# Patient Record
Sex: Female | Born: 1980 | Hispanic: Yes | Marital: Married
Health system: Southern US, Academic
[De-identification: ages and names within clinical notes are randomized; demographics above are authoritative.]

## PROBLEM LIST (undated history)

## (undated) ENCOUNTER — Ambulatory Visit: Payer: Medicaid (Managed Care)

## (undated) ENCOUNTER — Encounter: Attending: Advanced Practice Midwife | Primary: Advanced Practice Midwife

## (undated) ENCOUNTER — Encounter

## (undated) ENCOUNTER — Encounter: Attending: Dermatology | Primary: Dermatology

## (undated) ENCOUNTER — Telehealth: Attending: Dermatology | Primary: Dermatology

## (undated) ENCOUNTER — Telehealth

## (undated) ENCOUNTER — Ambulatory Visit

## (undated) ENCOUNTER — Encounter: Payer: PRIVATE HEALTH INSURANCE | Attending: Registered" | Primary: Registered"

## (undated) ENCOUNTER — Ambulatory Visit: Payer: Medicaid (Managed Care) | Attending: Retina Specialist | Primary: Retina Specialist

## (undated) ENCOUNTER — Encounter: Attending: Medical | Primary: Medical

## (undated) ENCOUNTER — Telehealth: Attending: Advanced Practice Midwife | Primary: Advanced Practice Midwife

## (undated) ENCOUNTER — Ambulatory Visit
Attending: Student in an Organized Health Care Education/Training Program | Primary: Student in an Organized Health Care Education/Training Program

## (undated) ENCOUNTER — Encounter: Attending: Primary Care | Primary: Primary Care

## (undated) ENCOUNTER — Ambulatory Visit: Payer: PRIVATE HEALTH INSURANCE | Attending: Registered" | Primary: Registered"

## (undated) ENCOUNTER — Encounter
Attending: Student in an Organized Health Care Education/Training Program | Primary: Student in an Organized Health Care Education/Training Program

## (undated) ENCOUNTER — Ambulatory Visit: Payer: PRIVATE HEALTH INSURANCE

## (undated) ENCOUNTER — Encounter: Payer: PRIVATE HEALTH INSURANCE | Attending: Advanced Practice Midwife | Primary: Advanced Practice Midwife

## (undated) ENCOUNTER — Encounter: Attending: Registered" | Primary: Registered"

## (undated) ENCOUNTER — Ambulatory Visit: Payer: PRIVATE HEALTH INSURANCE | Attending: Dermatology | Primary: Dermatology

## (undated) ENCOUNTER — Encounter: Payer: PRIVATE HEALTH INSURANCE | Attending: Obstetrics & Gynecology | Primary: Obstetrics & Gynecology

## (undated) ENCOUNTER — Ambulatory Visit: Payer: PRIVATE HEALTH INSURANCE | Attending: "Women's Health Care | Primary: "Women's Health Care

## (undated) ENCOUNTER — Encounter: Attending: Physician Assistant | Primary: Physician Assistant

## (undated) ENCOUNTER — Ambulatory Visit: Payer: Medicaid (Managed Care) | Attending: Medical | Primary: Medical

## (undated) ENCOUNTER — Encounter: Attending: Obstetrics & Gynecology | Primary: Obstetrics & Gynecology

## (undated) ENCOUNTER — Encounter: Payer: PRIVATE HEALTH INSURANCE | Attending: Retina Specialist | Primary: Retina Specialist

## (undated) ENCOUNTER — Telehealth
Attending: Student in an Organized Health Care Education/Training Program | Primary: Student in an Organized Health Care Education/Training Program

## (undated) ENCOUNTER — Telehealth: Payer: PRIVATE HEALTH INSURANCE

## (undated) DIAGNOSIS — E039 Hypothyroidism, unspecified: Secondary | ICD-10-CM

## (undated) DIAGNOSIS — F419 Anxiety disorder, unspecified: Secondary | ICD-10-CM

## (undated) DIAGNOSIS — E785 Hyperlipidemia, unspecified: Secondary | ICD-10-CM

## (undated) DIAGNOSIS — L409 Psoriasis, unspecified: Secondary | ICD-10-CM

## (undated) DIAGNOSIS — E119 Type 2 diabetes mellitus without complications: Secondary | ICD-10-CM

## (undated) DIAGNOSIS — Z87442 Personal history of urinary calculi: Secondary | ICD-10-CM

## (undated) HISTORY — DX: Psoriasis, unspecified: L40.9

## (undated) HISTORY — PX: DILATION AND CURETTAGE OF UTERUS: SHX78

## (undated) HISTORY — PX: APPENDECTOMY: SHX54

## (undated) MED ORDER — SITAGLIPTIN 50 MG-METFORMIN 1,000 MG TABLET: Freq: Two times a day (BID) | ORAL | 0 days

## (undated) MED ORDER — METFORMIN ER 500 MG 24 HR TABLET,EXTENDED RELEASE
Freq: Every day | ORAL | 0.00000 days
Start: ? — End: 2020-09-12

## (undated) MED ORDER — EMPAGLIFLOZIN 10 MG TABLET: Freq: Every day | ORAL | 0 days

## (undated) MED ORDER — ASPIRIN 81 MG CAPSULE: Freq: Every day | ORAL | 0.00000 days

---
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
## 2004-11-06 ENCOUNTER — Emergency Department: Payer: Self-pay | Admitting: Emergency Medicine

## 2004-11-07 ENCOUNTER — Ambulatory Visit: Payer: Self-pay | Admitting: Emergency Medicine

## 2004-11-20 ENCOUNTER — Emergency Department: Payer: Self-pay | Admitting: Emergency Medicine

## 2005-08-19 ENCOUNTER — Emergency Department: Payer: Self-pay | Admitting: Emergency Medicine

## 2009-07-03 ENCOUNTER — Ambulatory Visit: Payer: Self-pay

## 2017-07-06 ENCOUNTER — Ambulatory Visit
Admission: RE | Admit: 2017-07-06 | Discharge: 2017-07-06 | Payer: Commercial Managed Care - PPO | Attending: Dermatology | Admitting: Dermatology

## 2017-07-06 DIAGNOSIS — L409 Psoriasis, unspecified: Principal | ICD-10-CM

## 2017-07-06 DIAGNOSIS — Z3490 Encounter for supervision of normal pregnancy, unspecified, unspecified trimester: Secondary | ICD-10-CM

## 2017-07-06 MED ORDER — TRIAMCINOLONE ACETONIDE 0.1 % TOPICAL OINTMENT
6 refills | 0 days | Status: CP
Start: 2017-07-06 — End: ?

## 2017-07-28 ENCOUNTER — Emergency Department
Admission: EM | Admit: 2017-07-28 | Discharge: 2017-07-29 | Disposition: A | Discharge status: Home / Self Care | Payer: Medicaid Other | Attending: Student in an Organized Health Care Education/Training Program | Admitting: Student in an Organized Health Care Education/Training Program

## 2017-07-28 ENCOUNTER — Encounter: Payer: Self-pay | Admitting: *Deleted

## 2017-07-28 ENCOUNTER — Emergency Department: Payer: Medicaid Other

## 2017-07-28 DIAGNOSIS — Z3A01 Less than 8 weeks gestation of pregnancy: Secondary | ICD-10-CM | POA: Diagnosis not present

## 2017-07-28 DIAGNOSIS — O4690 Antepartum hemorrhage, unspecified, unspecified trimester: Secondary | ICD-10-CM

## 2017-07-28 DIAGNOSIS — N939 Abnormal uterine and vaginal bleeding, unspecified: Secondary | ICD-10-CM | POA: Diagnosis present

## 2017-07-28 DIAGNOSIS — O200 Threatened abortion: Secondary | ICD-10-CM | POA: Diagnosis not present

## 2017-07-28 LAB — URINALYSIS, COMPLETE (UACMP) WITH MICROSCOPIC
Bilirubin Urine: NEGATIVE
KETONES UR: NEGATIVE mg/dL
Leukocytes, UA: NEGATIVE
Nitrite: NEGATIVE
PROTEIN: NEGATIVE mg/dL
Specific Gravity, Urine: 1.001 — ABNORMAL LOW (ref 1.005–1.030)
pH: 7 (ref 5.0–8.0)

## 2017-07-28 LAB — CBC WITH DIFFERENTIAL/PLATELET
Basophils Absolute: 0 10*3/uL (ref 0–0.1)
Basophils Relative: 0 %
EOS PCT: 1 %
Eosinophils Absolute: 0.1 10*3/uL (ref 0–0.7)
HEMATOCRIT: 39.7 % (ref 35.0–47.0)
Hemoglobin: 13.3 g/dL (ref 12.0–16.0)
LYMPHS ABS: 3.3 10*3/uL (ref 1.0–3.6)
LYMPHS PCT: 29 %
MCH: 28.1 pg (ref 26.0–34.0)
MCHC: 33.4 g/dL (ref 32.0–36.0)
MCV: 84.3 fL (ref 80.0–100.0)
MONO ABS: 0.8 10*3/uL (ref 0.2–0.9)
MONOS PCT: 7 %
Neutro Abs: 7.2 10*3/uL — ABNORMAL HIGH (ref 1.4–6.5)
Neutrophils Relative %: 63 %
PLATELETS: 315 10*3/uL (ref 150–440)
RBC: 4.71 MIL/uL (ref 3.80–5.20)
RDW: 15.8 % — AB (ref 11.5–14.5)
WBC: 11.4 10*3/uL — ABNORMAL HIGH (ref 3.6–11.0)

## 2017-07-28 LAB — BASIC METABOLIC PANEL
Anion gap: 8 (ref 5–15)
BUN: 10 mg/dL (ref 6–20)
CALCIUM: 9.3 mg/dL (ref 8.9–10.3)
CO2: 25 mmol/L (ref 22–32)
Chloride: 104 mmol/L (ref 101–111)
Creatinine, Ser: 0.83 mg/dL (ref 0.44–1.00)
GFR calc Af Amer: >60 mL/min (ref >60)
GLUCOSE: 246 mg/dL — AB (ref 65–99)
Potassium: 3.7 mmol/L (ref 3.5–5.1)
Sodium: 137 mmol/L (ref 135–145)

## 2017-07-28 LAB — ABO/RH: ABO/RH(D): O POS

## 2017-07-28 LAB — POCT PREGNANCY, URINE: Preg Test, Ur: POSITIVE — AB

## 2017-07-28 LAB — HCG, QUANTITATIVE, PREGNANCY: HCG, BETA CHAIN, QUANT, S: 49304 m[IU]/mL — AB (ref <5)

## 2017-07-28 NOTE — ED Triage Notes (Signed)
Patient ambulatory to stat desk without any difficulty. Reports she is [redacted] weeks pregnant, noticed vaginal bleeding while in the shower. When asked how much bleeding she has, she states "it was like I was peeing and very clear with blood in it". Patient denies bleeding being more than a pad per hour.

## 2017-07-28 NOTE — ED Triage Notes (Signed)
Pt is approx [redacted] weeks pregnant.  Pt reports vaginal bleeding today.  No abd pain  No urianary sx.  Pt alert.

## 2017-07-29 NOTE — ED Notes (Signed)
Patient discharge and follow up information reviewed with patient by ED nursing staff and patient given the opportunity to ask questions pertaining to ED visit and discharge plan of care. Patient advised that should symptoms not continue to improve, resolve entirely, or should new symptoms develop then a follow up visit with their PCP or a return visit to the ED may be warranted. Patient verbalized consent and understanding of discharge plan of care including potential need for further evaluation. Patient being discharged in stable condition per attending ED physician on duty.  Discussed with pt with stratus interpreter

## 2017-07-29 NOTE — ED Notes (Signed)
Pt currently not in rm at this time, pt in ultrasound and will come to room when completed.

## 2017-07-29 NOTE — ED Provider Notes (Signed)
[facility] Emergency Department Provider Note    First MD Initiated Contact with Patient 07/29/17 0001     (approximate)  I have reviewed the triage vital signs and the nursing notes.   HISTORY  Chief Complaint Vaginal Bleeding    HPI Caroline Pollard is a 36 y.o. female presents with chief complaint of one episode of vaginal bleeding and spotting that started this evening. Denies any neck pain but did notice some cramping at initial bleeding. Denies any trauma. No fevers. This is her first pregnancy. No family history of bleeding disorders. Has not noted any passage of tissue or clot. Does have a history of pre-diabetes and thyroid disease for which she takes medications.   No past medical history on file. No family history on file. No past surgical history on file. There are no active problems to display for this patient.     Prior to Admission medications   Not on File    Allergies Patient has no known allergies.    Social History Social History  Substance Use Topics  . Smoking status: Never Smoker  . Smokeless tobacco: Never Used  . Alcohol use No    Review of Systems Patient denies headaches, rhinorrhea, blurry vision, numbness, shortness of breath, chest pain, edema, cough, abdominal pain, nausea, vomiting, diarrhea, dysuria, fevers, rashes or hallucinations unless otherwise stated above in HPI. ____________________________________________   PHYSICAL EXAM:  VITAL SIGNS: Vitals:   07/28/17 2044 07/29/17 0033  BP: 123/76   Pulse: 82 82  Resp: 20   Temp: 99.3 F (37.4 C)   SpO2: 100% 100%    Constitutional: Alert and oriented. Well appearing and in no acute distress. Eyes: Conjunctivae are normal.  Head: Atraumatic. Nose: No congestion/rhinnorhea. Mouth/Throat: Mucous membranes are moist.   Neck: No stridor. Painless ROM.  Cardiovascular: Normal rate, regular rhythm. Grossly normal heart sounds.  Good  peripheral circulation. Respiratory: Normal respiratory effort.  No retractions. Lungs CTAB. Gastrointestinal: Soft and nontender. No distention. No abdominal bruits. No CVA tenderness. Genitourinary: deferred Musculoskeletal: No lower extremity tenderness nor edema.  No joint effusions. Neurologic:  Normal speech and language. No gross focal neurologic deficits are appreciated. No facial droop Skin:  Skin is warm, dry and intact. No rash noted. Psychiatric: Mood and affect are normal. Speech and behavior are normal.  ____________________________________________   LABS (all labs ordered are listed, but only abnormal results are displayed)  Results for orders placed or performed during the hospital encounter of 07/28/17 (from the past 24 hour(s))  hCG, quantitative, pregnancy     Status: Abnormal   Collection Time: 07/28/17  8:43 PM  Result Value Ref Range   hCG, Beta Chain, Quant, S 49,304 (H) <5 mIU/mL  ABO/Rh     Status: None   Collection Time: 07/28/17  8:43 PM  Result Value Ref Range   ABO/RH(D) O POS   CBC with Differential     Status: Abnormal   Collection Time: 07/28/17  8:43 PM  Result Value Ref Range   WBC 11.4 (H) 3.6 - 11.0 K/uL   RBC 4.71 3.80 - 5.20 MIL/uL   Hemoglobin 13.3 12.0 - 16.0 g/dL   HCT 39.7 35.0 - 47.0 %   MCV 84.3 80.0 - 100.0 fL   MCH 28.1 26.0 - 34.0 pg   MCHC 33.4 32.0 - 36.0 g/dL   RDW 15.8 (H) 11.5 - 14.5 %   Platelets 315 150 - 440 K/uL   Neutrophils Relative % 63 %  Neutro Abs 7.2 (H) 1.4 - 6.5 K/uL   Lymphocytes Relative 29 %   Lymphs Abs 3.3 1.0 - 3.6 K/uL   Monocytes Relative 7 %   Monocytes Absolute 0.8 0.2 - 0.9 K/uL   Eosinophils Relative 1 %   Eosinophils Absolute 0.1 0 - 0.7 K/uL   Basophils Relative 0 %   Basophils Absolute 0.0 0 - 0.1 K/uL  Basic metabolic panel     Status: Abnormal   Collection Time: 07/28/17  8:43 PM  Result Value Ref Range   Sodium 137 135 - 145 mmol/L   Potassium 3.7 3.5 - 5.1 mmol/L   Chloride 104 101 -  111 mmol/L   CO2 25 22 - 32 mmol/L   Glucose, Bld 246 (H) 65 - 99 mg/dL   BUN 10 6 - 20 mg/dL   Creatinine, Ser 0.83 0.44 - 1.00 mg/dL   Calcium 9.3 8.9 - 10.3 mg/dL   GFR calc non Af Amer >60 >60 mL/min   GFR calc Af Amer >60 >60 mL/min   Anion gap 8 5 - 15  Urinalysis, Complete w Microscopic     Status: Abnormal   Collection Time: 07/28/17  8:43 PM  Result Value Ref Range   Color, Urine STRAW (A) YELLOW   APPearance CLEAR (A) CLEAR   Specific Gravity, Urine 1.001 (L) 1.005 - 1.030   pH 7.0 5.0 - 8.0   Glucose, UA >=500 (A) NEGATIVE mg/dL   Hgb urine dipstick MODERATE (A) NEGATIVE   Bilirubin Urine NEGATIVE NEGATIVE   Ketones, ur NEGATIVE NEGATIVE mg/dL   Protein, ur NEGATIVE NEGATIVE mg/dL   Nitrite NEGATIVE NEGATIVE   Leukocytes, UA NEGATIVE NEGATIVE   RBC / HPF 0-5 0 - 5 RBC/hpf   WBC, UA 0-5 0 - 5 WBC/hpf   Bacteria, UA RARE (A) NONE SEEN   Squamous Epithelial / LPF 0-5 (A) NONE SEEN  Pregnancy, urine POC     Status: Abnormal   Collection Time: 07/28/17  8:58 PM  Result Value Ref Range   Preg Test, Ur POSITIVE (A) NEGATIVE   ____________________________________________  ____________________________________________  RADIOLOGY  I personally reviewed all radiographic images ordered to evaluate for the above acute complaints and reviewed radiology reports and findings.  These findings were personally discussed with the patient.  Please see medical record for radiology report.  ____________________________________________   PROCEDURES  Procedure(s) performed:  Procedures    Critical Care performed: no ____________________________________________   INITIAL IMPRESSION / ASSESSMENT AND PLAN / ED COURSE  Pertinent labs & imaging results that were available during my care of the patient were reviewed by me and considered in my medical decision making (see chart for details).  DDX: threatened ab, ectopic, subchorionic hemorrhage, ruptured uterus  Caroline  Shavonda Pollard is a 36 y.o. who presents to the ED with vaginal bleeding in early pregnancy. Patient is Rh+. Blood work is reassuring the patient is hemodynamic stable. Ultrasound ordered for the above differential shows evidence of reassuring intrauterine pregnancy. No evidence of ectopic. Presentation most consistent with a threatened miscarriage. Patient stable for follow-up with OB/GYN.      ____________________________________________   FINAL CLINICAL IMPRESSION(S) / ED DIAGNOSES  Final diagnoses:  Vaginal bleeding in pregnancy  Threatened miscarriage      NEW MEDICATIONS STARTED DURING THIS VISIT:  New Prescriptions   No medications on file     Note:  This document was prepared using Dragon voice recognition software and may include unintentional dictation errors.    Merlyn Lot,  MD 07/29/17 9201

## 2017-08-03 ENCOUNTER — Ambulatory Visit
Admission: RE | Admit: 2017-08-03 | Discharge: 2017-08-03 | Disposition: A | Discharge status: Home / Self Care | Payer: MEDICAID

## 2017-08-03 ENCOUNTER — Ambulatory Visit
Admission: RE | Admit: 2017-08-03 | Discharge: 2017-08-03 | Disposition: A | Discharge status: Home / Self Care | Attending: Advanced Practice Midwife | Admitting: Advanced Practice Midwife

## 2017-08-03 ENCOUNTER — Ambulatory Visit: Admission: RE | Admit: 2017-08-03 | Discharge: 2017-08-03 | Disposition: A | Discharge status: Home / Self Care

## 2017-08-03 DIAGNOSIS — K760 Fatty (change of) liver, not elsewhere classified: Secondary | ICD-10-CM

## 2017-08-03 DIAGNOSIS — Q51810 Arcuate uterus: Secondary | ICD-10-CM

## 2017-08-03 DIAGNOSIS — O24111 Pre-existing diabetes mellitus, type 2, in pregnancy, first trimester: Secondary | ICD-10-CM

## 2017-08-03 DIAGNOSIS — E782 Mixed hyperlipidemia: Secondary | ICD-10-CM

## 2017-08-03 DIAGNOSIS — E669 Obesity, unspecified: Secondary | ICD-10-CM

## 2017-08-03 DIAGNOSIS — O0991 Supervision of high risk pregnancy, unspecified, first trimester: Secondary | ICD-10-CM

## 2017-08-03 DIAGNOSIS — E118 Type 2 diabetes mellitus with unspecified complications: Secondary | ICD-10-CM

## 2017-08-03 DIAGNOSIS — L409 Psoriasis, unspecified: Principal | ICD-10-CM

## 2017-08-03 DIAGNOSIS — E039 Hypothyroidism, unspecified: Secondary | ICD-10-CM

## 2017-08-03 DIAGNOSIS — O0901 Supervision of pregnancy with history of infertility, first trimester: Secondary | ICD-10-CM

## 2017-08-03 DIAGNOSIS — O09511 Supervision of elderly primigravida, first trimester: Principal | ICD-10-CM

## 2017-08-03 DIAGNOSIS — Z3490 Encounter for supervision of normal pregnancy, unspecified, unspecified trimester: Principal | ICD-10-CM

## 2017-08-03 MED ORDER — INSULIN SYRINGE U-100 WITH NEEDLE 0.3 ML 30 GAUGE X 5/16"
3 refills | 0 days | Status: CP
Start: 2017-08-03 — End: 2018-03-15

## 2017-08-03 MED ORDER — GLUCAGON (HUMAN RECOMBINANT) 1 MG INJECTION KIT
1 refills | 0 days | Status: CP
Start: 2017-08-03 — End: 2018-08-03

## 2017-08-03 MED ORDER — INSULIN NPH ISOPHANE U-100 HUMAN 100 UNIT/ML SUBCUTANEOUS SUSPENSION
3 refills | 0 days | Status: CP
Start: 2017-08-03 — End: 2017-10-02

## 2017-08-03 MED ORDER — INSULIN U-100 REGULAR HUMAN 100 UNIT/ML INJECTION SOLUTION
0 refills | 0 days | Status: CP
Start: 2017-08-03 — End: 2017-10-02

## 2017-08-04 ENCOUNTER — Emergency Department
Admission: EM | Admit: 2017-08-04 | Discharge: 2017-08-05 | Disposition: A | Discharge status: Home / Self Care | Payer: MEDICAID | Source: Intra-hospital

## 2017-08-04 ENCOUNTER — Emergency Department
Admission: EM | Admit: 2017-08-04 | Discharge: 2017-08-05 | Disposition: A | Discharge status: Home / Self Care | Payer: MEDICAID | Source: Intra-hospital | Attending: Emergency Medicine | Admitting: Emergency Medicine

## 2017-08-05 DIAGNOSIS — O4690 Antepartum hemorrhage, unspecified, unspecified trimester: Principal | ICD-10-CM

## 2017-08-10 ENCOUNTER — Ambulatory Visit
Admission: RE | Admit: 2017-08-10 | Discharge: 2017-08-10 | Disposition: A | Discharge status: Home / Self Care | Payer: MEDICAID | Attending: Registered" | Admitting: Registered"

## 2017-08-10 ENCOUNTER — Ambulatory Visit
Admission: RE | Admit: 2017-08-10 | Discharge: 2017-08-10 | Disposition: A | Discharge status: Home / Self Care | Payer: MEDICAID | Attending: Advanced Practice Midwife | Admitting: Advanced Practice Midwife

## 2017-08-10 ENCOUNTER — Ambulatory Visit
Admission: RE | Admit: 2017-08-10 | Discharge: 2017-08-10 | Disposition: A | Discharge status: Home / Self Care | Payer: MEDICAID

## 2017-08-10 DIAGNOSIS — O09511 Supervision of elderly primigravida, first trimester: Secondary | ICD-10-CM

## 2017-08-10 DIAGNOSIS — N939 Abnormal uterine and vaginal bleeding, unspecified: Principal | ICD-10-CM

## 2017-08-10 DIAGNOSIS — O24111 Pre-existing diabetes mellitus, type 2, in pregnancy, first trimester: Principal | ICD-10-CM

## 2017-08-10 DIAGNOSIS — O200 Threatened abortion: Secondary | ICD-10-CM

## 2017-08-10 DIAGNOSIS — E669 Obesity, unspecified: Secondary | ICD-10-CM

## 2017-08-10 DIAGNOSIS — E118 Type 2 diabetes mellitus with unspecified complications: Secondary | ICD-10-CM

## 2017-08-10 DIAGNOSIS — K760 Fatty (change of) liver, not elsewhere classified: Secondary | ICD-10-CM

## 2017-08-10 DIAGNOSIS — O0991 Supervision of high risk pregnancy, unspecified, first trimester: Principal | ICD-10-CM

## 2017-08-10 DIAGNOSIS — E039 Hypothyroidism, unspecified: Secondary | ICD-10-CM

## 2017-08-10 MED ORDER — LEVOTHYROXINE 137 MCG TABLET
ORAL_TABLET | Freq: Every day | ORAL | 3 refills | 0.00000 days | Status: CP
Start: 2017-08-10 — End: 2017-11-13

## 2017-08-19 ENCOUNTER — Ambulatory Visit
Admission: RE | Admit: 2017-08-19 | Discharge: 2017-08-19 | Disposition: A | Discharge status: Home / Self Care | Payer: MEDICAID | Attending: Advanced Practice Midwife | Admitting: Advanced Practice Midwife

## 2017-08-19 DIAGNOSIS — Q51810 Arcuate uterus: Secondary | ICD-10-CM

## 2017-08-19 DIAGNOSIS — E118 Type 2 diabetes mellitus with unspecified complications: Secondary | ICD-10-CM

## 2017-08-19 DIAGNOSIS — O09511 Supervision of elderly primigravida, first trimester: Secondary | ICD-10-CM

## 2017-08-19 DIAGNOSIS — E669 Obesity, unspecified: Secondary | ICD-10-CM

## 2017-08-19 DIAGNOSIS — O0991 Supervision of high risk pregnancy, unspecified, first trimester: Principal | ICD-10-CM

## 2017-08-19 DIAGNOSIS — E039 Hypothyroidism, unspecified: Secondary | ICD-10-CM

## 2017-08-26 ENCOUNTER — Ambulatory Visit
Admission: RE | Admit: 2017-08-26 | Discharge: 2017-08-26 | Disposition: A | Discharge status: Home / Self Care | Payer: MEDICAID | Attending: Advanced Practice Midwife | Admitting: Advanced Practice Midwife

## 2017-08-26 DIAGNOSIS — E669 Obesity, unspecified: Secondary | ICD-10-CM

## 2017-08-26 DIAGNOSIS — O09511 Supervision of elderly primigravida, first trimester: Secondary | ICD-10-CM

## 2017-08-26 DIAGNOSIS — E118 Type 2 diabetes mellitus with unspecified complications: Principal | ICD-10-CM

## 2017-08-26 DIAGNOSIS — E039 Hypothyroidism, unspecified: Secondary | ICD-10-CM

## 2017-08-26 DIAGNOSIS — O0991 Supervision of high risk pregnancy, unspecified, first trimester: Secondary | ICD-10-CM

## 2017-09-02 ENCOUNTER — Ambulatory Visit
Admission: RE | Admit: 2017-09-02 | Discharge: 2017-09-02 | Disposition: A | Discharge status: Home / Self Care | Payer: MEDICAID | Attending: Advanced Practice Midwife | Admitting: Advanced Practice Midwife

## 2017-09-02 DIAGNOSIS — O09511 Supervision of elderly primigravida, first trimester: Secondary | ICD-10-CM

## 2017-09-02 DIAGNOSIS — O0991 Supervision of high risk pregnancy, unspecified, first trimester: Secondary | ICD-10-CM

## 2017-09-02 DIAGNOSIS — E669 Obesity, unspecified: Secondary | ICD-10-CM

## 2017-09-02 DIAGNOSIS — E118 Type 2 diabetes mellitus with unspecified complications: Principal | ICD-10-CM

## 2017-09-02 DIAGNOSIS — E039 Hypothyroidism, unspecified: Secondary | ICD-10-CM

## 2017-09-09 ENCOUNTER — Ambulatory Visit
Admission: RE | Admit: 2017-09-09 | Discharge: 2017-09-09 | Disposition: A | Discharge status: Home / Self Care | Payer: MEDICAID | Attending: Advanced Practice Midwife | Admitting: Advanced Practice Midwife

## 2017-09-09 DIAGNOSIS — E118 Type 2 diabetes mellitus with unspecified complications: Principal | ICD-10-CM

## 2017-09-09 DIAGNOSIS — E039 Hypothyroidism, unspecified: Secondary | ICD-10-CM

## 2017-09-09 DIAGNOSIS — O26899 Other specified pregnancy related conditions, unspecified trimester: Secondary | ICD-10-CM

## 2017-09-09 DIAGNOSIS — E669 Obesity, unspecified: Secondary | ICD-10-CM

## 2017-09-09 DIAGNOSIS — Z9189 Other specified personal risk factors, not elsewhere classified: Secondary | ICD-10-CM

## 2017-09-09 DIAGNOSIS — O0991 Supervision of high risk pregnancy, unspecified, first trimester: Secondary | ICD-10-CM

## 2017-09-09 DIAGNOSIS — R109 Unspecified abdominal pain: Secondary | ICD-10-CM

## 2017-09-09 DIAGNOSIS — L409 Psoriasis, unspecified: Secondary | ICD-10-CM

## 2017-09-09 MED ORDER — BLOOD-GLUCOSE METER
0 refills | 0 days | Status: CP
Start: 2017-09-09 — End: ?

## 2017-09-09 MED ORDER — FREESTYLE INSULINX STRIPS
3 refills | 0 days | Status: CP
Start: 2017-09-09 — End: ?

## 2017-09-14 ENCOUNTER — Ambulatory Visit
Admission: RE | Admit: 2017-09-14 | Discharge: 2017-09-14 | Disposition: A | Discharge status: Home / Self Care | Payer: MEDICAID | Attending: MS" | Admitting: MS"

## 2017-09-14 ENCOUNTER — Ambulatory Visit
Admission: RE | Admit: 2017-09-14 | Discharge: 2017-09-14 | Disposition: A | Discharge status: Home / Self Care | Payer: MEDICAID

## 2017-09-14 DIAGNOSIS — Z315 Encounter for genetic counseling: Secondary | ICD-10-CM

## 2017-09-14 DIAGNOSIS — O09511 Supervision of elderly primigravida, first trimester: Secondary | ICD-10-CM

## 2017-09-14 DIAGNOSIS — O09521 Supervision of elderly multigravida, first trimester: Principal | ICD-10-CM

## 2017-09-24 ENCOUNTER — Ambulatory Visit
Admission: RE | Admit: 2017-09-24 | Discharge: 2017-09-24 | Disposition: A | Discharge status: Home / Self Care | Payer: MEDICAID | Attending: Advanced Practice Midwife | Admitting: Advanced Practice Midwife

## 2017-09-24 DIAGNOSIS — L409 Psoriasis, unspecified: Secondary | ICD-10-CM

## 2017-09-24 DIAGNOSIS — O09511 Supervision of elderly primigravida, first trimester: Secondary | ICD-10-CM

## 2017-09-24 DIAGNOSIS — O0991 Supervision of high risk pregnancy, unspecified, first trimester: Secondary | ICD-10-CM

## 2017-09-24 DIAGNOSIS — E039 Hypothyroidism, unspecified: Secondary | ICD-10-CM

## 2017-09-24 DIAGNOSIS — E669 Obesity, unspecified: Secondary | ICD-10-CM

## 2017-09-24 DIAGNOSIS — E118 Type 2 diabetes mellitus with unspecified complications: Principal | ICD-10-CM

## 2017-10-02 MED ORDER — INSULIN U-100 REGULAR HUMAN 100 UNIT/ML INJECTION SOLUTION
0 refills | 0 days | Status: CP
Start: 2017-10-02 — End: 2017-11-09

## 2017-10-02 MED ORDER — INSULIN NPH ISOPHANE U-100 HUMAN 100 UNIT/ML SUBCUTANEOUS SUSPENSION
3 refills | 0 days | Status: CP
Start: 2017-10-02 — End: 2017-11-09

## 2017-10-07 ENCOUNTER — Ambulatory Visit: Admission: RE | Admit: 2017-10-07 | Discharge: 2017-10-07 | Payer: MEDICAID | Attending: Ophthalmology

## 2017-10-07 DIAGNOSIS — Z0389 Encounter for observation for other suspected diseases and conditions ruled out: Secondary | ICD-10-CM

## 2017-10-07 DIAGNOSIS — E118 Type 2 diabetes mellitus with unspecified complications: Principal | ICD-10-CM

## 2017-10-09 ENCOUNTER — Ambulatory Visit
Admission: RE | Admit: 2017-10-09 | Discharge: 2017-10-09 | Disposition: A | Discharge status: Home / Self Care | Payer: MEDICAID | Attending: Advanced Practice Midwife | Admitting: Advanced Practice Midwife

## 2017-10-09 DIAGNOSIS — E039 Hypothyroidism, unspecified: Secondary | ICD-10-CM

## 2017-10-09 DIAGNOSIS — N750 Cyst of Bartholin's gland: Secondary | ICD-10-CM

## 2017-10-09 DIAGNOSIS — E669 Obesity, unspecified: Secondary | ICD-10-CM

## 2017-10-09 DIAGNOSIS — O0992 Supervision of high risk pregnancy, unspecified, second trimester: Secondary | ICD-10-CM

## 2017-10-09 DIAGNOSIS — E118 Type 2 diabetes mellitus with unspecified complications: Principal | ICD-10-CM

## 2017-10-09 DIAGNOSIS — L409 Psoriasis, unspecified: Secondary | ICD-10-CM

## 2017-10-12 ENCOUNTER — Ambulatory Visit: Admission: RE | Admit: 2017-10-12 | Discharge: 2017-10-12 | Payer: MEDICAID

## 2017-10-12 DIAGNOSIS — L409 Psoriasis, unspecified: Principal | ICD-10-CM

## 2017-10-12 MED ORDER — CLOBETASOL 0.05 % SCALP SOLUTION
Freq: Every day | TOPICAL | 6 refills | 0.00000 days | Status: CP
Start: 2017-10-12 — End: 2018-09-15

## 2017-10-12 MED ORDER — HALOBETASOL PROPIONATE 0.05 % TOPICAL OINTMENT
Freq: Two times a day (BID) | TOPICAL | 6 refills | 0.00000 days | Status: CP
Start: 2017-10-12 — End: 2018-09-15

## 2017-10-23 ENCOUNTER — Ambulatory Visit
Admission: RE | Admit: 2017-10-23 | Discharge: 2017-10-23 | Disposition: A | Discharge status: Home / Self Care | Payer: MEDICAID | Attending: Advanced Practice Midwife | Admitting: Advanced Practice Midwife

## 2017-10-23 DIAGNOSIS — E039 Hypothyroidism, unspecified: Secondary | ICD-10-CM

## 2017-10-23 DIAGNOSIS — L409 Psoriasis, unspecified: Principal | ICD-10-CM

## 2017-10-23 DIAGNOSIS — Q51810 Arcuate uterus: Secondary | ICD-10-CM

## 2017-10-23 DIAGNOSIS — N750 Cyst of Bartholin's gland: Secondary | ICD-10-CM

## 2017-10-23 DIAGNOSIS — E669 Obesity, unspecified: Secondary | ICD-10-CM

## 2017-10-23 DIAGNOSIS — O0992 Supervision of high risk pregnancy, unspecified, second trimester: Secondary | ICD-10-CM

## 2017-10-23 DIAGNOSIS — E118 Type 2 diabetes mellitus with unspecified complications: Secondary | ICD-10-CM

## 2017-10-29 ENCOUNTER — Encounter: Admit: 2017-10-29 | Discharge: 2017-10-29 | Payer: PRIVATE HEALTH INSURANCE

## 2017-10-29 DIAGNOSIS — O09521 Supervision of elderly multigravida, first trimester: Principal | ICD-10-CM

## 2017-10-29 DIAGNOSIS — E118 Type 2 diabetes mellitus with unspecified complications: Secondary | ICD-10-CM

## 2017-11-09 MED ORDER — INSULIN NPH ISOPHANE U-100 HUMAN 100 UNIT/ML SUBCUTANEOUS SUSPENSION
3 refills | 0 days | Status: CP
Start: 2017-11-09 — End: 2017-12-31

## 2017-11-09 MED ORDER — INSULIN U-100 REGULAR HUMAN 100 UNIT/ML INJECTION SOLUTION
3 refills | 0 days | Status: CP
Start: 2017-11-09 — End: 2017-12-31

## 2017-11-13 ENCOUNTER — Encounter
Admit: 2017-11-13 | Discharge: 2017-11-14 | Payer: PRIVATE HEALTH INSURANCE | Attending: Advanced Practice Midwife | Primary: Advanced Practice Midwife

## 2017-11-13 DIAGNOSIS — N750 Cyst of Bartholin's gland: Secondary | ICD-10-CM

## 2017-11-13 DIAGNOSIS — E669 Obesity, unspecified: Secondary | ICD-10-CM

## 2017-11-13 DIAGNOSIS — O0992 Supervision of high risk pregnancy, unspecified, second trimester: Principal | ICD-10-CM

## 2017-11-13 DIAGNOSIS — E039 Hypothyroidism, unspecified: Secondary | ICD-10-CM

## 2017-11-13 DIAGNOSIS — O09511 Supervision of elderly primigravida, first trimester: Secondary | ICD-10-CM

## 2017-11-13 DIAGNOSIS — Q51810 Arcuate uterus: Secondary | ICD-10-CM

## 2017-11-13 DIAGNOSIS — E118 Type 2 diabetes mellitus with unspecified complications: Secondary | ICD-10-CM

## 2017-11-13 MED ORDER — LEVOTHYROXINE 125 MCG TABLET
ORAL_TABLET | Freq: Every day | ORAL | 3 refills | 0.00000 days | Status: CP
Start: 2017-11-13 — End: 2017-12-11

## 2017-11-30 ENCOUNTER — Encounter: Admit: 2017-11-30 | Discharge: 2017-11-30 | Payer: PRIVATE HEALTH INSURANCE

## 2017-11-30 ENCOUNTER — Encounter
Admit: 2017-11-30 | Discharge: 2017-11-30 | Payer: PRIVATE HEALTH INSURANCE | Attending: Obstetrics & Gynecology | Primary: Obstetrics & Gynecology

## 2017-11-30 DIAGNOSIS — E118 Type 2 diabetes mellitus with unspecified complications: Principal | ICD-10-CM

## 2017-11-30 DIAGNOSIS — O09522 Supervision of elderly multigravida, second trimester: Principal | ICD-10-CM

## 2017-12-03 ENCOUNTER — Encounter
Admit: 2017-12-03 | Discharge: 2017-12-04 | Payer: PRIVATE HEALTH INSURANCE | Attending: Advanced Practice Midwife | Primary: Advanced Practice Midwife

## 2017-12-03 DIAGNOSIS — E118 Type 2 diabetes mellitus with unspecified complications: Principal | ICD-10-CM

## 2017-12-03 DIAGNOSIS — E669 Obesity, unspecified: Secondary | ICD-10-CM

## 2017-12-03 DIAGNOSIS — L409 Psoriasis, unspecified: Secondary | ICD-10-CM

## 2017-12-03 DIAGNOSIS — E039 Hypothyroidism, unspecified: Secondary | ICD-10-CM

## 2017-12-03 DIAGNOSIS — O0992 Supervision of high risk pregnancy, unspecified, second trimester: Secondary | ICD-10-CM

## 2017-12-03 DIAGNOSIS — O09511 Supervision of elderly primigravida, first trimester: Secondary | ICD-10-CM

## 2017-12-10 ENCOUNTER — Encounter
Admit: 2017-12-10 | Discharge: 2017-12-11 | Payer: PRIVATE HEALTH INSURANCE | Attending: Maternal & Fetal Medicine | Primary: Maternal & Fetal Medicine

## 2017-12-10 DIAGNOSIS — Z3490 Encounter for supervision of normal pregnancy, unspecified, unspecified trimester: Secondary | ICD-10-CM

## 2017-12-10 DIAGNOSIS — E118 Type 2 diabetes mellitus with unspecified complications: Secondary | ICD-10-CM

## 2017-12-10 DIAGNOSIS — O0992 Supervision of high risk pregnancy, unspecified, second trimester: Secondary | ICD-10-CM

## 2017-12-10 DIAGNOSIS — E039 Hypothyroidism, unspecified: Principal | ICD-10-CM

## 2017-12-10 DIAGNOSIS — E669 Obesity, unspecified: Secondary | ICD-10-CM

## 2017-12-10 DIAGNOSIS — Q51810 Arcuate uterus: Secondary | ICD-10-CM

## 2017-12-10 DIAGNOSIS — O09511 Supervision of elderly primigravida, first trimester: Secondary | ICD-10-CM

## 2017-12-11 MED ORDER — LEVOTHYROXINE 100 MCG TABLET
ORAL_TABLET | Freq: Every day | ORAL | 11 refills | 0.00000 days | Status: CP
Start: 2017-12-11 — End: 2018-01-14

## 2017-12-17 ENCOUNTER — Encounter
Admit: 2017-12-17 | Discharge: 2017-12-18 | Payer: PRIVATE HEALTH INSURANCE | Attending: Advanced Practice Midwife | Primary: Advanced Practice Midwife

## 2017-12-17 DIAGNOSIS — E039 Hypothyroidism, unspecified: Secondary | ICD-10-CM

## 2017-12-17 DIAGNOSIS — O0992 Supervision of high risk pregnancy, unspecified, second trimester: Principal | ICD-10-CM

## 2017-12-17 DIAGNOSIS — E669 Obesity, unspecified: Secondary | ICD-10-CM

## 2017-12-17 DIAGNOSIS — E118 Type 2 diabetes mellitus with unspecified complications: Secondary | ICD-10-CM

## 2017-12-31 ENCOUNTER — Encounter
Admit: 2017-12-31 | Discharge: 2018-01-01 | Payer: PRIVATE HEALTH INSURANCE | Attending: Advanced Practice Midwife | Primary: Advanced Practice Midwife

## 2017-12-31 DIAGNOSIS — E669 Obesity, unspecified: Secondary | ICD-10-CM

## 2017-12-31 DIAGNOSIS — Q51810 Arcuate uterus: Secondary | ICD-10-CM

## 2017-12-31 DIAGNOSIS — E039 Hypothyroidism, unspecified: Secondary | ICD-10-CM

## 2017-12-31 DIAGNOSIS — N750 Cyst of Bartholin's gland: Secondary | ICD-10-CM

## 2017-12-31 DIAGNOSIS — E118 Type 2 diabetes mellitus with unspecified complications: Principal | ICD-10-CM

## 2017-12-31 DIAGNOSIS — O0992 Supervision of high risk pregnancy, unspecified, second trimester: Secondary | ICD-10-CM

## 2017-12-31 MED ORDER — INSULIN U-100 REGULAR HUMAN 100 UNIT/ML INJECTION SOLUTION
3 refills | 0 days | Status: CP
Start: 2017-12-31 — End: 2018-03-15

## 2017-12-31 MED ORDER — INSULIN NPH ISOPHANE U-100 HUMAN 100 UNIT/ML SUBCUTANEOUS SUSPENSION
3 refills | 0 days | Status: CP
Start: 2017-12-31 — End: 2018-03-15

## 2018-01-06 ENCOUNTER — Encounter
Admit: 2018-01-06 | Discharge: 2018-01-07 | Payer: PRIVATE HEALTH INSURANCE | Attending: Advanced Practice Midwife | Primary: Advanced Practice Midwife

## 2018-01-06 DIAGNOSIS — Q51810 Arcuate uterus: Secondary | ICD-10-CM

## 2018-01-06 DIAGNOSIS — K760 Fatty (change of) liver, not elsewhere classified: Secondary | ICD-10-CM

## 2018-01-06 DIAGNOSIS — E039 Hypothyroidism, unspecified: Secondary | ICD-10-CM

## 2018-01-06 DIAGNOSIS — O09511 Supervision of elderly primigravida, first trimester: Secondary | ICD-10-CM

## 2018-01-06 DIAGNOSIS — E669 Obesity, unspecified: Secondary | ICD-10-CM

## 2018-01-06 DIAGNOSIS — O0992 Supervision of high risk pregnancy, unspecified, second trimester: Secondary | ICD-10-CM

## 2018-01-06 DIAGNOSIS — Z315 Encounter for genetic counseling: Secondary | ICD-10-CM

## 2018-01-06 DIAGNOSIS — N750 Cyst of Bartholin's gland: Principal | ICD-10-CM

## 2018-01-06 DIAGNOSIS — L409 Psoriasis, unspecified: Secondary | ICD-10-CM

## 2018-01-11 ENCOUNTER — Encounter: Admit: 2018-01-11 | Discharge: 2018-01-11 | Payer: PRIVATE HEALTH INSURANCE

## 2018-01-11 DIAGNOSIS — E118 Type 2 diabetes mellitus with unspecified complications: Principal | ICD-10-CM

## 2018-01-14 ENCOUNTER — Encounter
Admit: 2018-01-14 | Discharge: 2018-01-15 | Payer: PRIVATE HEALTH INSURANCE | Attending: Advanced Practice Midwife | Primary: Advanced Practice Midwife

## 2018-01-14 DIAGNOSIS — O0992 Supervision of high risk pregnancy, unspecified, second trimester: Principal | ICD-10-CM

## 2018-01-14 DIAGNOSIS — J339 Nasal polyp, unspecified: Secondary | ICD-10-CM

## 2018-01-14 DIAGNOSIS — E118 Type 2 diabetes mellitus with unspecified complications: Secondary | ICD-10-CM

## 2018-01-14 DIAGNOSIS — E039 Hypothyroidism, unspecified: Secondary | ICD-10-CM

## 2018-01-14 DIAGNOSIS — E669 Obesity, unspecified: Secondary | ICD-10-CM

## 2018-01-14 DIAGNOSIS — Q51810 Arcuate uterus: Secondary | ICD-10-CM

## 2018-01-14 MED ORDER — LEVOTHYROXINE 125 MCG TABLET
ORAL_TABLET | Freq: Every day | ORAL | 11 refills | 0.00000 days | Status: CP
Start: 2018-01-14 — End: 2018-03-15

## 2018-01-22 ENCOUNTER — Encounter
Admit: 2018-01-22 | Discharge: 2018-01-22 | Payer: PRIVATE HEALTH INSURANCE | Attending: Maternal & Fetal Medicine | Primary: Maternal & Fetal Medicine

## 2018-01-22 ENCOUNTER — Encounter
Admit: 2018-01-22 | Discharge: 2018-01-22 | Payer: PRIVATE HEALTH INSURANCE | Attending: Advanced Practice Midwife | Primary: Advanced Practice Midwife

## 2018-01-22 DIAGNOSIS — E118 Type 2 diabetes mellitus with unspecified complications: Principal | ICD-10-CM

## 2018-01-22 DIAGNOSIS — Q51810 Arcuate uterus: Secondary | ICD-10-CM

## 2018-01-22 DIAGNOSIS — E039 Hypothyroidism, unspecified: Secondary | ICD-10-CM

## 2018-01-22 DIAGNOSIS — O0992 Supervision of high risk pregnancy, unspecified, second trimester: Principal | ICD-10-CM

## 2018-01-22 DIAGNOSIS — E669 Obesity, unspecified: Secondary | ICD-10-CM

## 2018-01-22 DIAGNOSIS — J339 Nasal polyp, unspecified: Secondary | ICD-10-CM

## 2018-01-29 ENCOUNTER — Encounter
Admit: 2018-01-29 | Discharge: 2018-01-30 | Payer: PRIVATE HEALTH INSURANCE | Attending: Maternal & Fetal Medicine | Primary: Maternal & Fetal Medicine

## 2018-01-29 ENCOUNTER — Encounter: Admit: 2018-01-29 | Discharge: 2018-01-30 | Payer: PRIVATE HEALTH INSURANCE

## 2018-01-29 ENCOUNTER — Encounter
Admit: 2018-01-29 | Discharge: 2018-01-30 | Payer: PRIVATE HEALTH INSURANCE | Attending: Advanced Practice Midwife | Primary: Advanced Practice Midwife

## 2018-01-29 ENCOUNTER — Ambulatory Visit: Admit: 2018-01-29 | Discharge: 2018-01-30 | Payer: PRIVATE HEALTH INSURANCE

## 2018-01-29 DIAGNOSIS — L409 Psoriasis, unspecified: Secondary | ICD-10-CM

## 2018-01-29 DIAGNOSIS — K760 Fatty (change of) liver, not elsewhere classified: Secondary | ICD-10-CM

## 2018-01-29 DIAGNOSIS — Q51810 Arcuate uterus: Secondary | ICD-10-CM

## 2018-01-29 DIAGNOSIS — O09511 Supervision of elderly primigravida, first trimester: Secondary | ICD-10-CM

## 2018-01-29 DIAGNOSIS — O0992 Supervision of high risk pregnancy, unspecified, second trimester: Secondary | ICD-10-CM

## 2018-01-29 DIAGNOSIS — E039 Hypothyroidism, unspecified: Secondary | ICD-10-CM

## 2018-01-29 DIAGNOSIS — E118 Type 2 diabetes mellitus with unspecified complications: Principal | ICD-10-CM

## 2018-01-29 DIAGNOSIS — J339 Nasal polyp, unspecified: Secondary | ICD-10-CM

## 2018-01-29 DIAGNOSIS — N750 Cyst of Bartholin's gland: Secondary | ICD-10-CM

## 2018-01-29 DIAGNOSIS — E669 Obesity, unspecified: Secondary | ICD-10-CM

## 2018-01-29 DIAGNOSIS — Z315 Encounter for genetic counseling: Secondary | ICD-10-CM

## 2018-02-05 ENCOUNTER — Encounter: Admit: 2018-02-05 | Discharge: 2018-02-06 | Payer: PRIVATE HEALTH INSURANCE

## 2018-02-05 ENCOUNTER — Ambulatory Visit
Admit: 2018-02-05 | Discharge: 2018-02-06 | Payer: PRIVATE HEALTH INSURANCE | Attending: "Obstetric | Primary: "Obstetric

## 2018-02-05 DIAGNOSIS — E039 Hypothyroidism, unspecified: Principal | ICD-10-CM

## 2018-02-05 DIAGNOSIS — L409 Psoriasis, unspecified: Secondary | ICD-10-CM

## 2018-02-05 DIAGNOSIS — K760 Fatty (change of) liver, not elsewhere classified: Secondary | ICD-10-CM

## 2018-02-05 DIAGNOSIS — Q51810 Arcuate uterus: Secondary | ICD-10-CM

## 2018-02-05 DIAGNOSIS — O09511 Supervision of elderly primigravida, first trimester: Secondary | ICD-10-CM

## 2018-02-05 DIAGNOSIS — N750 Cyst of Bartholin's gland: Secondary | ICD-10-CM

## 2018-02-05 DIAGNOSIS — Z315 Encounter for genetic counseling: Secondary | ICD-10-CM

## 2018-02-05 DIAGNOSIS — E669 Obesity, unspecified: Secondary | ICD-10-CM

## 2018-02-05 DIAGNOSIS — E1100 Type 2 diabetes mellitus with hyperosmolarity without nonketotic hyperglycemic-hyperosmolar coma (NKHHC): Secondary | ICD-10-CM

## 2018-02-05 DIAGNOSIS — E118 Type 2 diabetes mellitus with unspecified complications: Principal | ICD-10-CM

## 2018-02-05 DIAGNOSIS — J339 Nasal polyp, unspecified: Secondary | ICD-10-CM

## 2018-02-05 DIAGNOSIS — O0992 Supervision of high risk pregnancy, unspecified, second trimester: Secondary | ICD-10-CM

## 2018-02-12 ENCOUNTER — Encounter: Admit: 2018-02-12 | Discharge: 2018-02-12 | Payer: PRIVATE HEALTH INSURANCE

## 2018-02-12 DIAGNOSIS — E039 Hypothyroidism, unspecified: Secondary | ICD-10-CM

## 2018-02-12 DIAGNOSIS — E118 Type 2 diabetes mellitus with unspecified complications: Principal | ICD-10-CM

## 2018-02-19 ENCOUNTER — Encounter
Admit: 2018-02-19 | Discharge: 2018-02-20 | Payer: PRIVATE HEALTH INSURANCE | Attending: Maternal & Fetal Medicine | Primary: Maternal & Fetal Medicine

## 2018-02-19 ENCOUNTER — Encounter
Admit: 2018-02-19 | Discharge: 2018-02-20 | Payer: PRIVATE HEALTH INSURANCE | Attending: Advanced Practice Midwife | Primary: Advanced Practice Midwife

## 2018-02-19 DIAGNOSIS — J339 Nasal polyp, unspecified: Principal | ICD-10-CM

## 2018-02-19 DIAGNOSIS — O0992 Supervision of high risk pregnancy, unspecified, second trimester: Secondary | ICD-10-CM

## 2018-02-19 DIAGNOSIS — E669 Obesity, unspecified: Secondary | ICD-10-CM

## 2018-02-19 DIAGNOSIS — Q51810 Arcuate uterus: Secondary | ICD-10-CM

## 2018-02-19 DIAGNOSIS — E1100 Type 2 diabetes mellitus with hyperosmolarity without nonketotic hyperglycemic-hyperosmolar coma (NKHHC): Principal | ICD-10-CM

## 2018-02-19 DIAGNOSIS — N750 Cyst of Bartholin's gland: Secondary | ICD-10-CM

## 2018-02-19 DIAGNOSIS — Z315 Encounter for genetic counseling: Secondary | ICD-10-CM

## 2018-02-19 DIAGNOSIS — O09511 Supervision of elderly primigravida, first trimester: Secondary | ICD-10-CM

## 2018-02-19 DIAGNOSIS — L409 Psoriasis, unspecified: Secondary | ICD-10-CM

## 2018-02-19 DIAGNOSIS — E039 Hypothyroidism, unspecified: Secondary | ICD-10-CM

## 2018-02-19 DIAGNOSIS — K760 Fatty (change of) liver, not elsewhere classified: Secondary | ICD-10-CM

## 2018-02-23 ENCOUNTER — Encounter: Admit: 2018-02-23 | Discharge: 2018-02-24 | Payer: PRIVATE HEALTH INSURANCE

## 2018-02-23 ENCOUNTER — Institutional Professional Consult (permissible substitution): Admit: 2018-02-23 | Discharge: 2018-02-24 | Payer: PRIVATE HEALTH INSURANCE

## 2018-02-23 DIAGNOSIS — E118 Type 2 diabetes mellitus with unspecified complications: Principal | ICD-10-CM

## 2018-02-23 DIAGNOSIS — Z9189 Other specified personal risk factors, not elsewhere classified: Principal | ICD-10-CM

## 2018-02-26 ENCOUNTER — Ambulatory Visit
Admit: 2018-02-26 | Discharge: 2018-02-27 | Payer: PRIVATE HEALTH INSURANCE | Attending: Advanced Practice Midwife | Primary: Advanced Practice Midwife

## 2018-02-26 ENCOUNTER — Encounter
Admit: 2018-02-26 | Discharge: 2018-02-27 | Payer: PRIVATE HEALTH INSURANCE | Attending: Maternal & Fetal Medicine | Primary: Maternal & Fetal Medicine

## 2018-02-26 DIAGNOSIS — E118 Type 2 diabetes mellitus with unspecified complications: Principal | ICD-10-CM

## 2018-03-02 ENCOUNTER — Encounter: Admit: 2018-03-02 | Discharge: 2018-03-03 | Payer: PRIVATE HEALTH INSURANCE

## 2018-03-02 DIAGNOSIS — E118 Type 2 diabetes mellitus with unspecified complications: Principal | ICD-10-CM

## 2018-03-04 ENCOUNTER — Ambulatory Visit
Admit: 2018-03-04 | Discharge: 2018-03-05 | Payer: PRIVATE HEALTH INSURANCE | Attending: Advanced Practice Midwife | Primary: Advanced Practice Midwife

## 2018-03-04 ENCOUNTER — Encounter: Admit: 2018-03-04 | Discharge: 2018-03-05 | Payer: PRIVATE HEALTH INSURANCE

## 2018-03-04 DIAGNOSIS — O0992 Supervision of high risk pregnancy, unspecified, second trimester: Secondary | ICD-10-CM

## 2018-03-04 DIAGNOSIS — O09511 Supervision of elderly primigravida, first trimester: Secondary | ICD-10-CM

## 2018-03-04 DIAGNOSIS — E039 Hypothyroidism, unspecified: Secondary | ICD-10-CM

## 2018-03-04 DIAGNOSIS — E1100 Type 2 diabetes mellitus with hyperosmolarity without nonketotic hyperglycemic-hyperosmolar coma (NKHHC): Principal | ICD-10-CM

## 2018-03-04 DIAGNOSIS — E669 Obesity, unspecified: Secondary | ICD-10-CM

## 2018-03-05 ENCOUNTER — Ambulatory Visit
Admit: 2018-03-05 | Discharge: 2018-03-06 | Payer: PRIVATE HEALTH INSURANCE | Attending: Maternal & Fetal Medicine | Primary: Maternal & Fetal Medicine

## 2018-03-05 DIAGNOSIS — E118 Type 2 diabetes mellitus with unspecified complications: Principal | ICD-10-CM

## 2018-03-10 ENCOUNTER — Encounter: Admit: 2018-03-10 | Discharge: 2018-03-10 | Payer: PRIVATE HEALTH INSURANCE

## 2018-03-10 ENCOUNTER — Ambulatory Visit
Admit: 2018-03-10 | Discharge: 2018-03-10 | Payer: PRIVATE HEALTH INSURANCE | Attending: Advanced Practice Midwife | Primary: Advanced Practice Midwife

## 2018-03-10 DIAGNOSIS — E118 Type 2 diabetes mellitus with unspecified complications: Principal | ICD-10-CM

## 2018-03-11 ENCOUNTER — Encounter
Admit: 2018-03-11 | Discharge: 2018-03-15 | Disposition: A | Discharge status: Home / Self Care | Payer: PRIVATE HEALTH INSURANCE | Attending: Student in an Organized Health Care Education/Training Program | Admitting: Maternal & Fetal Medicine

## 2018-03-11 ENCOUNTER — Ambulatory Visit
Admit: 2018-03-11 | Discharge: 2018-03-15 | Disposition: A | Discharge status: Home / Self Care | Payer: PRIVATE HEALTH INSURANCE | Admitting: Maternal & Fetal Medicine

## 2018-03-11 DIAGNOSIS — O24113 Pre-existing diabetes mellitus, type 2, in pregnancy, third trimester: Principal | ICD-10-CM

## 2018-03-15 MED ORDER — FERROUS SULFATE 325 MG (65 MG IRON) TABLET
ORAL_TABLET | Freq: Two times a day (BID) | ORAL | 11 refills | 0.00000 days | Status: CP
Start: 2018-03-15 — End: 2019-03-15

## 2018-03-15 MED ORDER — OXYCODONE 5 MG TABLET
ORAL_TABLET | Freq: Four times a day (QID) | ORAL | 0 refills | 0.00000 days | Status: CP | PRN
Start: 2018-03-15 — End: 2018-03-18

## 2018-03-15 MED ORDER — DOCUSATE SODIUM 100 MG CAPSULE
ORAL_CAPSULE | Freq: Two times a day (BID) | ORAL | 0 refills | 0 days | Status: CP
Start: 2018-03-15 — End: 2018-04-14

## 2018-03-15 MED ORDER — IBUPROFEN 600 MG TABLET
ORAL_TABLET | Freq: Four times a day (QID) | ORAL | 0 refills | 0.00000 days | Status: CP
Start: 2018-03-15 — End: ?

## 2018-03-15 MED ORDER — ACETAMINOPHEN 325 MG TABLET
ORAL_TABLET | Freq: Four times a day (QID) | ORAL | 0 refills | 0 days | Status: CP
Start: 2018-03-15 — End: ?

## 2018-03-16 MED ORDER — LEVOTHYROXINE 112 MCG TABLET
ORAL_TABLET | Freq: Every day | ORAL | 3 refills | 0 days | Status: CP
Start: 2018-03-16 — End: 2018-04-21

## 2018-03-16 MED ORDER — METFORMIN 500 MG TABLET
ORAL_TABLET | Freq: Every day | ORAL | 0 refills | 0 days | Status: CP
Start: 2018-03-16 — End: 2018-04-21

## 2018-03-18 MED ORDER — OXYCODONE 5 MG TABLET
ORAL_TABLET | ORAL | 0 refills | 0.00000 days | Status: CP | PRN
Start: 2018-03-18 — End: ?

## 2018-04-21 ENCOUNTER — Other Ambulatory Visit
Admit: 2018-04-21 | Discharge: 2018-04-22 | Payer: PRIVATE HEALTH INSURANCE | Attending: Advanced Practice Midwife | Primary: Advanced Practice Midwife

## 2018-04-21 MED ORDER — LEVOTHYROXINE 100 MCG TABLET
ORAL_TABLET | Freq: Every day | ORAL | 11 refills | 0 days | Status: CP
Start: 2018-04-21 — End: 2019-04-21

## 2018-04-21 MED ORDER — METFORMIN 500 MG TABLET
ORAL_TABLET | Freq: Every day | ORAL | 0 refills | 0 days | Status: CP
Start: 2018-04-21 — End: 2018-05-21

## 2018-09-15 ENCOUNTER — Ambulatory Visit: Admit: 2018-09-15 | Discharge: 2018-09-16

## 2018-09-15 DIAGNOSIS — L409 Psoriasis, unspecified: Principal | ICD-10-CM

## 2018-09-15 DIAGNOSIS — L650 Telogen effluvium: Secondary | ICD-10-CM

## 2018-09-15 MED ORDER — CLOBETASOL 0.05 % SCALP SOLUTION
Freq: Every day | TOPICAL | 6 refills | 0.00000 days | Status: CP
Start: 2018-09-15 — End: 2019-09-15

## 2018-09-15 MED ORDER — HALOBETASOL PROPIONATE 0.05 % TOPICAL OINTMENT
Freq: Two times a day (BID) | TOPICAL | 6 refills | 0.00000 days | Status: CP
Start: 2018-09-15 — End: 2019-09-15

## 2019-06-20 ENCOUNTER — Other Ambulatory Visit: Payer: Self-pay

## 2019-06-20 DIAGNOSIS — Z20822 Contact with and (suspected) exposure to covid-19: Secondary | ICD-10-CM

## 2019-06-21 LAB — NOVEL CORONAVIRUS, NAA: SARS-CoV-2, NAA: NOT DETECTED

## 2019-09-14 ENCOUNTER — Ambulatory Visit: Admit: 2019-09-14 | Discharge: 2019-09-15

## 2019-09-14 DIAGNOSIS — L409 Psoriasis, unspecified: Principal | ICD-10-CM

## 2019-09-14 MED ORDER — DESONIDE 0.05 % TOPICAL OINTMENT
Freq: Two times a day (BID) | TOPICAL | 3 refills | 0.00000 days | Status: CP
Start: 2019-09-14 — End: 2020-09-13

## 2019-09-14 MED ORDER — HALOBETASOL PROPIONATE 0.05 % TOPICAL OINTMENT
Freq: Two times a day (BID) | TOPICAL | 6 refills | 0.00000 days | Status: CP
Start: 2019-09-14 — End: 2020-09-13

## 2019-09-14 MED ORDER — CLOBETASOL 0.05 % SCALP SOLUTION
Freq: Every day | TOPICAL | 6 refills | 0.00000 days | Status: CP
Start: 2019-09-14 — End: 2020-09-13

## 2019-12-06 ENCOUNTER — Ambulatory Visit: Payer: Self-pay | Attending: Internal Medicine

## 2019-12-06 DIAGNOSIS — Z20822 Contact with and (suspected) exposure to covid-19: Secondary | ICD-10-CM | POA: Insufficient documentation

## 2019-12-07 LAB — NOVEL CORONAVIRUS, NAA: SARS-CoV-2, NAA: NOT DETECTED

## 2020-02-28 ENCOUNTER — Ambulatory Visit
Admit: 2020-02-28 | Discharge: 2020-02-29 | Payer: PRIVATE HEALTH INSURANCE | Attending: Dermatology | Primary: Dermatology

## 2020-02-28 DIAGNOSIS — L409 Psoriasis, unspecified: Principal | ICD-10-CM

## 2020-02-28 MED ORDER — HALOBETASOL PROPIONATE 0.05 % TOPICAL OINTMENT
Freq: Two times a day (BID) | TOPICAL | 2 refills | 0.00000 days | Status: CP
Start: 2020-02-28 — End: 2021-02-27

## 2020-02-28 MED ORDER — CLOBETASOL 0.05 % SCALP SOLUTION
Freq: Every day | TOPICAL | 5 refills | 0 days | Status: CP
Start: 2020-02-28 — End: 2021-02-27

## 2020-03-19 ENCOUNTER — Telehealth
Admit: 2020-03-19 | Discharge: 2020-03-20 | Payer: PRIVATE HEALTH INSURANCE | Attending: Advanced Practice Midwife | Primary: Advanced Practice Midwife

## 2020-03-19 MED ORDER — GLUCAGON (HUMAN RECOMBINANT) 1 MG SOLUTION FOR INJECTION
Freq: Once | INTRAMUSCULAR | 0 refills | 1 days | Status: CP
Start: 2020-03-19 — End: 2020-03-19

## 2020-03-19 MED ORDER — INSULIN NPH ISOPHANE U-100 HUMAN 100 UNIT/ML SUBCUTANEOUS SUSPENSION
3 refills | 0 days | Status: CP
Start: 2020-03-19 — End: 2021-03-19

## 2020-03-19 MED ORDER — INSULIN SYRINGE U-100 WITH NEEDLE 0.3 ML 30 GAUGE X 5/16" (8 MM)
3 refills | 0 days | Status: CP
Start: 2020-03-19 — End: ?

## 2020-03-19 MED ORDER — INSULIN U-100 REGULAR HUMAN 100 UNIT/ML INJECTION SOLUTION
3 refills | 0 days | Status: CP
Start: 2020-03-19 — End: ?

## 2020-03-20 DIAGNOSIS — O0992 Supervision of high risk pregnancy, unspecified, second trimester: Principal | ICD-10-CM

## 2020-03-20 DIAGNOSIS — R8290 Unspecified abnormal findings in urine: Principal | ICD-10-CM

## 2020-03-20 DIAGNOSIS — O26851 Spotting complicating pregnancy, first trimester: Principal | ICD-10-CM

## 2020-03-21 ENCOUNTER — Ambulatory Visit: Admit: 2020-03-21 | Discharge: 2020-03-22 | Payer: PRIVATE HEALTH INSURANCE

## 2020-03-21 ENCOUNTER — Encounter: Admit: 2020-03-21 | Discharge: 2020-03-22 | Payer: PRIVATE HEALTH INSURANCE

## 2020-03-21 DIAGNOSIS — O0992 Supervision of high risk pregnancy, unspecified, second trimester: Principal | ICD-10-CM

## 2020-03-21 DIAGNOSIS — O26851 Spotting complicating pregnancy, first trimester: Principal | ICD-10-CM

## 2020-03-21 DIAGNOSIS — R8290 Unspecified abnormal findings in urine: Principal | ICD-10-CM

## 2020-03-21 MED ORDER — PRENATAL VITAMIN WITH CALCIUM NO.72-IRON 27 MG-FOLIC ACID 1 MG TABLET
ORAL_TABLET | Freq: Every day | ORAL | 3 refills | 100.00000 days | Status: CP
Start: 2020-03-21 — End: ?

## 2020-03-31 ENCOUNTER — Other Ambulatory Visit: Payer: Self-pay

## 2020-03-31 ENCOUNTER — Emergency Department
Admission: EM | Admit: 2020-03-31 | Discharge: 2020-04-01 | Disposition: A | Discharge status: Home / Self Care | Payer: Medicaid Other | Attending: Emergency Medicine | Admitting: Emergency Medicine

## 2020-03-31 ENCOUNTER — Emergency Department: Payer: Medicaid Other

## 2020-03-31 DIAGNOSIS — O039 Complete or unspecified spontaneous abortion without complication: Secondary | ICD-10-CM | POA: Diagnosis not present

## 2020-03-31 DIAGNOSIS — O209 Hemorrhage in early pregnancy, unspecified: Secondary | ICD-10-CM

## 2020-03-31 DIAGNOSIS — Z3A11 11 weeks gestation of pregnancy: Secondary | ICD-10-CM | POA: Diagnosis not present

## 2020-03-31 DIAGNOSIS — R102 Pelvic and perineal pain: Secondary | ICD-10-CM | POA: Diagnosis not present

## 2020-03-31 DIAGNOSIS — R1030 Lower abdominal pain, unspecified: Secondary | ICD-10-CM

## 2020-03-31 LAB — CBC
HCT: 39.1 % (ref 36.0–46.0)
Hemoglobin: 13.1 g/dL (ref 12.0–15.0)
MCH: 29.6 pg (ref 26.0–34.0)
MCHC: 33.5 g/dL (ref 30.0–36.0)
MCV: 88.3 fL (ref 80.0–100.0)
Platelets: 354 10*3/uL (ref 150–400)
RBC: 4.43 MIL/uL (ref 3.87–5.11)
RDW: 13.5 % (ref 11.5–15.5)
WBC: 11.6 10*3/uL — ABNORMAL HIGH (ref 4.0–10.5)
nRBC: 0 % (ref 0.0–0.2)

## 2020-03-31 LAB — URINALYSIS, COMPLETE (UACMP) WITH MICROSCOPIC
Bilirubin Urine: NEGATIVE
Glucose, UA: NEGATIVE mg/dL
Ketones, ur: NEGATIVE mg/dL
Leukocytes,Ua: NEGATIVE
Nitrite: NEGATIVE
Protein, ur: NEGATIVE mg/dL
Specific Gravity, Urine: 1.019 (ref 1.005–1.030)
pH: 6 (ref 5.0–8.0)

## 2020-03-31 LAB — COMPREHENSIVE METABOLIC PANEL
ALT: 94 U/L — ABNORMAL HIGH (ref 0–44)
AST: 67 U/L — ABNORMAL HIGH (ref 15–41)
Albumin: 3.6 g/dL (ref 3.5–5.0)
Alkaline Phosphatase: 91 U/L (ref 38–126)
Anion gap: 8 (ref 5–15)
BUN: 10 mg/dL (ref 6–20)
CO2: 26 mmol/L (ref 22–32)
Calcium: 9 mg/dL (ref 8.9–10.3)
Chloride: 103 mmol/L (ref 98–111)
Creatinine, Ser: 0.78 mg/dL (ref 0.44–1.00)
GFR calc Af Amer: >60 mL/min (ref >60)
GFR calc non Af Amer: >60 mL/min (ref >60)
Glucose, Bld: 127 mg/dL — ABNORMAL HIGH (ref 70–99)
Potassium: 4 mmol/L (ref 3.5–5.1)
Sodium: 137 mmol/L (ref 135–145)
Total Bilirubin: 0.5 mg/dL (ref 0.3–1.2)
Total Protein: 7 g/dL (ref 6.5–8.1)

## 2020-03-31 LAB — LIPASE, BLOOD: Lipase: 38 U/L (ref 11–51)

## 2020-03-31 LAB — HCG, QUANTITATIVE, PREGNANCY: hCG, Beta Chain, Quant, S: 8620 m[IU]/mL — ABNORMAL HIGH (ref <5)

## 2020-03-31 NOTE — ED Notes (Signed)
Patient given warm blanket and box of tissues. Patient states she has visitor that will be here soon.

## 2020-03-31 NOTE — ED Triage Notes (Signed)
Pt comes POV with lower abdominal pain after believing that she "passed" her 41 week old fetus. Pt has been bleeding for 2 weeks and went to the bathroom and large amounts of tissue that resembled a baby came out. Pt wants to make sure nothing "stayed" and that her pain is normal. Tearful in triage. G2P1A1. Interpreter used.

## 2020-03-31 NOTE — ED Provider Notes (Signed)
[facility] Emergency Department Provider Note   ____________________________________________   First MD Initiated Contact with Patient 03/31/20 2312     (approximate)  I have reviewed the triage vital signs and the nursing notes.   HISTORY  Chief Complaint Abdominal Pain and Miscarriage    HPI Caroline Pollard is a 39 y.o. female G2P1 O+ approximately [redacted] weeks pregnant who presents to the ED from home for vaginal bleeding. Patient is followed by [facility]. She has had intermittent spotting for the past 2 weeks. She had an ultrasound done on 03/20/2020 which demonstrated nonviable pregnancy and was scheduled to follow-up in 2 weeks. This evening patient had increased pelvic cramping and passed large blood clots and what she believes was a fetus. Presents with pelvic pain. Denies fever, cough, chest pain, shortness of breath, nausea, vomiting or dizziness.       Past medical history None  There are no problems to display for this patient.   History reviewed. No pertinent surgical history.  Prior to Admission medications   Medication Sig Start Date End Date Taking? Authorizing Provider  HYDROcodone-acetaminophen (NORCO) 5-325 MG tablet Take 1 tablet by mouth every 6 (six) hours as needed for moderate pain. 04/01/20   Paulette Blanch, MD  methylergonovine (METHERGINE) 0.2 MG tablet Take 1 tablet (0.2 mg total) by mouth 4 (four) times daily for 2 days. 04/01/20 04/03/20  Paulette Blanch, MD    Allergies Patient has no known allergies.  History reviewed. No pertinent family history.  Social History Social History   Tobacco Use  . Smoking status: Never Smoker  . Smokeless tobacco: Never Used  Substance Use Topics  . Alcohol use: No  . Drug use: No    Review of Systems  Constitutional: No fever/chills Eyes: No visual changes. ENT: No sore throat. Cardiovascular: Denies chest pain. Respiratory: Denies shortness of breath. Gastrointestinal:  Positive for pelvic pain. No abdominal pain.  No nausea, no vomiting.  No diarrhea.  No constipation. Genitourinary: Positive for vaginal bleeding. Negative for dysuria. Musculoskeletal: Negative for back pain. Skin: Negative for rash. Neurological: Negative for headaches, focal weakness or numbness.   ____________________________________________   PHYSICAL EXAM:  VITAL SIGNS: ED Triage Vitals  Enc Vitals Group     BP 03/31/20 1928 (!) 145/85     Pulse Rate 03/31/20 1928 88     Resp 03/31/20 1928 18     Temp 03/31/20 1928 98.9 F (37.2 C)     Temp Source 03/31/20 1928 Oral     SpO2 03/31/20 1928 96 %     Weight 03/31/20 1929 198 lb (89.8 kg)     Height 03/31/20 1929 5\' 1"  (1.549 m)     Head Circumference --      Peak Flow --      Pain Score 03/31/20 1929 8     Pain Loc --      Pain Edu? --      Excl. in [place]? --     Constitutional: Alert and oriented. Well appearing and in no acute distress. Eyes: Conjunctivae are normal. PERRL. EOMI. Head: Atraumatic. Nose: No congestion/rhinnorhea. Mouth/Throat: Mucous membranes are moist.  Oropharynx non-erythematous. Neck: No stridor.   Cardiovascular: Normal rate, regular rhythm. Grossly normal heart sounds.  Good peripheral circulation. Respiratory: Normal respiratory effort.  No retractions. Lungs CTAB. Gastrointestinal: Soft and mildly tender to palpation pelvis without rebound or guarding. No distention. No abdominal bruits. No CVA tenderness. Pelvic: External exam within normal limits without  rashes, lesions or vesicles. Mild vaginal bleeding on speculum exam. Cervical os is closed. Bimanual exam unremarkable. Musculoskeletal: No lower extremity tenderness nor edema.  No joint effusions. Neurologic:  Normal speech and language. No gross focal neurologic deficits are appreciated. No gait instability. Skin:  Skin is warm, dry and intact. No rash noted. Psychiatric: Mood and affect are normal. Speech and behavior are  normal.  ____________________________________________   LABS (all labs ordered are listed, but only abnormal results are displayed)  Labs Reviewed  COMPREHENSIVE METABOLIC PANEL - Abnormal; Notable for the following components:      Result Value   Glucose, Bld 127 (*)    AST 67 (*)    ALT 94 (*)    All other components within normal limits  CBC - Abnormal; Notable for the following components:   WBC 11.6 (*)    All other components within normal limits  URINALYSIS, COMPLETE (UACMP) WITH MICROSCOPIC - Abnormal; Notable for the following components:   Color, Urine YELLOW (*)    APPearance CLOUDY (*)    Hgb urine dipstick LARGE (*)    Bacteria, UA RARE (*)    All other components within normal limits  HCG, QUANTITATIVE, PREGNANCY - Abnormal; Notable for the following components:   hCG, Beta Chain, Quant, S 8,620 (*)    All other components within normal limits  LIPASE, BLOOD   ____________________________________________  EKG  None ____________________________________________  RADIOLOGY  ED MD interpretation: No IUP, endometrial thickening without hypervascularity to suggest retained products of conception; no evidence of ectopic pregnancy  Official radiology report(s): US OB LESS THAN 14 WEEKS WITH OB TRANSVAGINAL  Result Date: 03/31/2020 CLINICAL DATA:  Pregnant patient with vaginal bleeding affecting early pregnancy. Lower abdominal pain. Patient reports "passed baby/fetal parts today" with persistent vaginal bleeding. EXAM: OBSTETRIC <14 WK [place] AND TRANSVAGINAL OB US TECHNIQUE: Both transabdominal and transvaginal ultrasound examinations were performed for complete evaluation of the gestation as well as the maternal uterus, adnexal regions, and pelvic cul-de-sac. Transvaginal technique was performed to assess early pregnancy. COMPARISON:  None this pregnancy. FINDINGS: Intrauterine gestational sac: None Yolk sac:  Not Visualized. Embryo:  Not Visualized. Cardiac Activity: Not  Visualized. Maternal uterus/adnexae: The uterus is anteverted. No intrauterine gestational sac. The endometrium measures 13 mm. No endometrial fluid or focal abnormality. No evidence of endometrial vascularity. The left ovary is normal. The right ovary is not seen. There is no adnexal mass or pelvic free fluid. IMPRESSION: 1. No intrauterine pregnancy. Endometrial thickening without hypervascularity to suggest retained products of conception. 2. No evidence of ectopic pregnancy. Electronically Signed   By: Keith Rake M.D.   On: 03/31/2020 21:47    ____________________________________________   PROCEDURES  Procedure(s) performed (including Critical Care):  Procedures   ____________________________________________   INITIAL IMPRESSION / ASSESSMENT AND PLAN / ED COURSE  As part of my medical decision making, I reviewed the following data within the [place] notes reviewed and incorporated, Labs reviewed, Radiograph reviewed, Notes from prior ED visits and [place] Controlled Substance Database     Caroline Pollard was evaluated in Emergency Department on 04/01/2020 for the symptoms described in the history of present illness. She was evaluated in the context of the global COVID-19 pandemic, which necessitated consideration that the patient might be at risk for infection with the SARS-CoV-2 virus that causes COVID-19. Institutional protocols and algorithms that pertain to the evaluation of patients at risk for COVID-19 are in a state of rapid change based on  information released by regulatory bodies including the CDC and federal and state organizations. These policies and algorithms were followed during the patient's care in the ED.    39 year old female G2 P1 approximately [redacted] weeks pregnant who presents for vaginal bleeding. Differential diagnosis includes, but is not limited to, ovarian cyst, ovarian torsion, acute appendicitis, diverticulitis, urinary tract  infection/pyelonephritis, endometriosis, bowel obstruction, colitis, renal colic, gastroenteritis, hernia, fibroids, endometriosis, pregnancy related pain including ectopic pregnancy, etc.  Laboratory results remarkable for beta hCG 8620. I personally reviewed patient's record and see she is blood type O+. Ultrasound demonstrates endometrial thickening suggestive of retained products of conception. Will discuss with OB/GYN.   Clinical Course as of Apr 02 39  Sun Apr 01, 2020  0018 Spoke with Dr. Ouida Sills from OB/GYN who recommends PO Methergine 0.2 mg every 6 hours for 1 to 2 days and follow-up with her OB at [facility].  Will also prescribe Norco to use as needed for pain.  Strict return precautions given.  Patient and family member verbalized understanding agree with plan of care.   [JS]    Clinical Course User Index [JS] Paulette Blanch, MD     ____________________________________________   FINAL CLINICAL IMPRESSION(S) / ED DIAGNOSES  Final diagnoses:  Miscarriage     ED Discharge Orders         Ordered    methylergonovine (METHERGINE) 0.2 MG tablet  4 times daily     04/01/20 0030    HYDROcodone-acetaminophen (NORCO) 5-325 MG tablet  Every 6 hours PRN     04/01/20 0030           Note:  This document was prepared using Dragon voice recognition software and may include unintentional dictation errors.   Paulette Blanch, MD 04/01/20 [phone]

## 2020-03-31 NOTE — ED Notes (Signed)
Report given to [facility] T RN

## 2020-04-01 MED ORDER — HYDROCODONE-ACETAMINOPHEN 5-325 MG PO TABS: 1.0000 | ORAL_TABLET | Freq: Four times a day (QID) | ORAL | 0 refills | Status: DC | PRN

## 2020-04-01 MED ORDER — HYDROCODONE-ACETAMINOPHEN 5-325 MG PO TABS
1.0000 | ORAL_TABLET | Freq: Once | ORAL | Status: AC
  Administered 2020-04-01: 1 via ORAL
  Filled 2020-04-01: qty 1

## 2020-04-01 MED ORDER — METHYLERGONOVINE MALEATE 0.2 MG PO TABS
0.2000 mg | ORAL_TABLET | Freq: Once | ORAL | Status: AC
  Administered 2020-04-01: 0.2 mg via ORAL
  Filled 2020-04-01: qty 1

## 2020-04-01 MED ORDER — METHYLERGONOVINE MALEATE 0.2 MG PO TABS: 0.2000 mg | ORAL_TABLET | Freq: Four times a day (QID) | ORAL | 0 refills | Status: AC

## 2020-04-01 NOTE — Discharge Instructions (Addendum)
1. Take Methergine 0.2 mg 4 times daily x2 days. 2. You may take Norco as needed for pain. 3. Return to the ER for worsening symptoms, persistent vomiting, difficulty breathing or other concerns.

## 2020-04-20 ENCOUNTER — Ambulatory Visit
Admit: 2020-04-20 | Discharge: 2020-04-21 | Payer: PRIVATE HEALTH INSURANCE | Attending: Obstetrics & Gynecology | Primary: Obstetrics & Gynecology

## 2020-04-20 DIAGNOSIS — O039 Complete or unspecified spontaneous abortion without complication: Principal | ICD-10-CM

## 2020-05-01 ENCOUNTER — Encounter
Admit: 2020-05-01 | Discharge: 2020-05-02 | Payer: PRIVATE HEALTH INSURANCE | Attending: Dermatology | Primary: Dermatology

## 2020-05-01 DIAGNOSIS — Z79899 Other long term (current) drug therapy: Principal | ICD-10-CM

## 2020-05-01 DIAGNOSIS — L409 Psoriasis, unspecified: Principal | ICD-10-CM

## 2020-05-01 MED ORDER — STELARA 45 MG/0.5 ML SUBCUTANEOUS SYRINGE: mL | 3 refills | 0 days | Status: AC

## 2020-05-01 MED ORDER — STELARA 45 MG/0.5 ML SUBCUTANEOUS SYRINGE
SUBCUTANEOUS | 0 refills | 0.00000 days | Status: CP
Start: 2020-05-01 — End: ?

## 2020-05-01 NOTE — Unmapped (Signed)
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  - Start ustekinumab (STELARA) 45 mg/0.5 mL Syrg syringe; 45 mg at 0 and 4 weeks, and then every 12 weeks thereafter. We discussed potential risk for increased infections. She denies prior history of TB or risk factors. She is agreeable to starting medicine without checking HIV, Hep serologies, or Quant gold.  - Continue Clobetasol 0.05% solution daily PRN to scalp.  - Continue Halobetasol 0.05% ointment BID PRN to body.  - Continue OTC coal tar BID.  - Continue natural sunlight for 5 minutes 3 times weekly.    High risk medication use (Stelara)  - Consider checking Quant gold, Hep serologies, HIV if needed for medication approval.    The patient was advised to call for an appointment should any new, changing, or symptomatic lesions develop.     RTC: Return in about 3 months (around 08/01/2020) for psoriasis. or sooner as needed   _________________________________________________________________      Chief Complaint     Chief Complaint   Patient presents with   ??? Follow-up       HPI     Stephanie Hester is a 39 y.o. female who was last seen by Dr. Donny Pique on 02/28/2020, and was seen today by Louann Liv, MD for f/u psoriasis.    At last visit, patient was [redacted] weeks pregnant. She was treated with Clobetasol solution to scalp, Halobetasol ointment to body, OTC coal tar BID, and natural sunlight 5 minutes 3 times weekly. She unfortunately had a miscarriage a month ago. She reports currently the skin is slightly improved, as it usually is when she is not pregnant (pregnancy seems to flare her psoriasis). She reports some L knee pain that worsens with use and is chronic throughout the day.    The patient denies any other new or changing lesions or areas of concern.     Pertinent Past Medical History     No history of skin cancer    Family History:   Negative for melanoma  History of psoriasis and DM in sister     Past Medical History, Family History, Social History, Medication List, Allergies, and Problem List were reviewed in the rooming section of Epic.   Spanish speaking, but able to communicate in [place] without interpreter    ROS: Other than symptoms mentioned in the HPI, no fevers, chills, or other skin complaints    Physical Examination     GENERAL: Well-appearing Fitzpatrick skin type IV female in no acute distress, resting comfortably.  NEURO: Alert and oriented, answers questions appropriately  PSYCH: Normal mood and affect  SKIN (Full Skin Exam): Examination of the face, eyelids, lips, nose, ears, neck, back, arms, legs, hands, feet, palms, soles, nails was performed  - Scalp with few well circumscribed erythematous plaques with overlying silvery scale.  - Several well circumscribed red papules and plaques with overlying scale on knees > rest of BLE and BUE. Some surrounding hypopigmented patches surrounding active papules/plaques.    All areas not commented on are within normal limits or unremarkable

## 2020-05-02 DIAGNOSIS — L409 Psoriasis, unspecified: Principal | ICD-10-CM

## 2020-05-09 DIAGNOSIS — L409 Psoriasis, unspecified: Principal | ICD-10-CM

## 2020-05-09 MED ORDER — HUMIRA PEN CITRATE FREE 40 MG/0.4 ML
SUBCUTANEOUS | 11 refills | 28.00000 days | Status: CP
Start: 2020-05-09 — End: ?

## 2020-05-09 MED ORDER — HUMIRA PEN CITRATE FREE STARTER PACK FOR PS/UV 1X 80 MG/0.8 ML, 2X 40 MG/0.4 ML
ORAL | 0 refills | 0.00000 days | Status: CP
Start: 2020-05-09 — End: ?
  Filled 2020-05-18: qty 3, 35d supply, fill #0

## 2020-05-10 DIAGNOSIS — L409 Psoriasis, unspecified: Principal | ICD-10-CM

## 2020-05-14 NOTE — Unmapped (Signed)
[facility] Specialty Medication Onboarding    Specialty Medication: Humira Starter pack  Prior Authorization: Approved   Financial Assistance: No - copay  <$25  Final Copay/Day Supply: $0 / 28 days    Insurance Restrictions: Yes - max 1 month supply     Notes to Pharmacist: Still waiting for maintenance dose PA approval.     The triage team has completed the benefits investigation and has determined that the patient is able to fill this medication at [facility]. Please contact the patient to complete the onboarding or follow up with the prescribing physician as needed.

## 2020-05-14 NOTE — Unmapped (Signed)
I saw and evaluated the patient, participating in the key portions of the service.  I reviewed the resident???s note.  I agree with the resident???s findings and plan.    Louann Liv, MD

## 2020-05-15 DIAGNOSIS — L409 Psoriasis, unspecified: Principal | ICD-10-CM

## 2020-05-15 MED ORDER — EMPTY CONTAINER
2 refills | 0 days
Start: 2020-05-15 — End: ?

## 2020-05-15 MED ORDER — HALOBETASOL PROPIONATE 0.05 % TOPICAL OINTMENT
Freq: Two times a day (BID) | TOPICAL | 2 refills | 0.00000 days
Start: 2020-05-15 — End: 2021-05-15

## 2020-05-15 NOTE — Unmapped (Signed)
I reviewed this patient case and all documentation provided by the learner and was readily available for consultation during their interaction with the patient.  I agree with the assessment and plan listed below.    [name], PharmD, BCPS   [facility] Specialty Pharmacist        [facility]   Patient Onboarding/Medication Counseling    [name] is a 39 y.o. female with Psoriasis who I am counseling today on initiation of therapy.  I am speaking to the patient.    Was a Nurse, learning disability used for this call? Yes, Spanish. Patient language is appropriate in [facility]    Verified patient's date of birth / HIPAA.    Specialty medication(s) to be sent: Inflammatory Disorders: Humira      Non-specialty medications/supplies to be sent: Sharps container, halobetasol      Medications not needed at this time: N/A         Humira (adalimumab)    Medication & Administration     Dosage: Plaque psoriasis: Inject 80mg  under the skin on day 1, then 40mg  every 14 days starting on day 8    Lab tests required prior to treatment initiation:  ??? Tuberculosis: Tuberculosis screening not documented in the patient's chart but medication prescriber has indicated they are aware and wishing to initiate treatment at this time.  ??? Hepatitis B: Hepatitis B serology studies are not documented in the patient's chart but medication prescriber has indicated they are aware and wishing to initiate treatment at this time.    Administration:     Prefilled auto-injector pen  1. Gather all supplies needed for injection on a clean, flat working surface: medication pen removed from packaging, alcohol swab, sharps container, etc.  2. Look at the medication label - look for correct medication, correct dose, and check the expiration date  3. Look at the medication - the liquid visible in the window on the side of the pen device should appear clear and colorless  4. Lay the auto-injector pen on a flat surface and allow it to warm up to room temperature for at least 30-45 minutes  5. Select injection site - you can use the front of your thigh or your belly (but not the area 2 inches around your belly button); if someone else is giving you the injection you can also use your upper arm in the skin covering your triceps muscle  6. Prepare injection site - wash your hands and clean the skin at the injection site with an alcohol swab and let it air dry, do not touch the injection site again before the injection  7. Pull the 2 safety caps straight off - gray/white to uncover the needle cover and the plum cap to uncover the plum activator button, do not remove until immediately prior to injection and do not touch the white needle cover  8. Gently squeeze the area of cleaned skin and hold it firmly to create a firm surface at the selected injection site  9. Put the white needle cover against your skin at the injection site at a 90 degree angle, hold the pen such that you can see the clear medication window  10. Press down and hold the pen firmly against your skin, press the plum activator button to initiate the injection, there will be a click when the injection starts  11. Continue to hold the pen firmly against your skin for about 10-15 seconds - the window  will start to turn solid yellow  12. To verify the injection is complete after 10-15 seconds, look and ensure the window is solid yellow and then pull the pen away from your skin  13. Dispose of the used auto-injector pen immediately in your sharps disposal container the needle will be covered automatically  14. If you see any blood at the injection site, press a cotton ball or gauze on the site and maintain pressure until the bleeding stops, do not rub the injection site    Adherence/Missed dose instructions:  If your injection is given more than 3 days after your scheduled injection date ??? consult your pharmacist for additional instructions on how to adjust your dosing schedule.    Goals of Therapy     - Achieve remission/inactive disease or low/minimal disease activity  - Maintenance of function  - Minimization of systemic manifestations and comorbidities  - Maintenance of effective psychosocial functioning    Side Effects & Monitoring Parameters     ??? Injection site reaction (redness, irritation, inflammation localized to the site of administration)  ??? Signs of a common cold ??? minor sore throat, runny or stuffy nose, etc.  ??? Upset stomach  ??? Headache    The following side effects should be reported to the provider:  ??? Signs of a hypersensitivity reaction ??? rash; hives; itching; red, swollen, blistered, or peeling skin; wheezing; tightness in the chest or throat; difficulty breathing, swallowing, or talking; swelling of the mouth, face, lips, tongue, or throat; etc.  ??? Reduced immune function ??? report signs of infection such as fever; chills; body aches; very bad sore throat; ear or sinus pain; cough; more sputum or change in color of sputum; pain with passing urine; wound that will not heal, etc.  Also at a slightly higher risk of some malignancies (mainly skin and blood cancers) due to this reduced immune function.  o In the case of signs of infection ??? the patient should hold the next dose of Humira?? and call your primary care provider to ensure adequate medical care.  Treatment may be resumed when infection is treated and patient is asymptomatic.  ??? Changes in skin ??? a new growth or lump that forms; changes in shape, size, or color of a previous mole or marking  ??? Signs of unexplained bruising or bleeding ??? throwing up blood or emesis that looks like coffee grounds; black, tarry, or bloody stool; etc.  ??? Signs of new or worsening heart failure ??? shortness of breath; sudden weight gain; heartbeat that is not normal; swelling in the arms or legs that is new or worse      Contraindications, Warnings, & Precautions     ??? Have your bloodwork checked as you have been told by your prescriber  ??? Talk with your doctor if you are pregnant, planning to become pregnant, or breastfeeding  ??? Discuss the possible need for holding your dose(s) of Humira?? when a planned procedure is scheduled with the prescriber as it may delay healing/recovery timeline       Drug/Food Interactions     ??? Medication list reviewed in Epic. The patient was instructed to inform the care team before taking any new medications or supplements. No drug interactions identified.   ??? Talk with you prescriber or pharmacist before receiving any live vaccinations while taking this medication and after you stop taking it    Storage, Handling Precautions, & Disposal     ??? Store this medication in the refrigerator.  Do not freeze  ??? If needed, you may store at room temperature for up to 14 days  ??? Store in original packaging, protected from light  ??? Do not shake  ??? Dispose of used syringes/pens in a sharps disposal container          Current Medications (including OTC/herbals), Comorbidities and Allergies     Current Outpatient Medications   Medication Sig Dispense Refill   ??? blood-glucose meter (FREESTYLE INSULINX) Misc Test fasting and one hour after each meal.  Any brand ok. 1 each 0   ??? clobetasoL (TEMOVATE) 0.05 % external solution Apply topically daily. To scalp 50 mL 5   ??? desonide (DESOWEN) 0.05 % ointment Apply topically Two (2) times a day. As needed for rash on the face until clear then stop (Patient not taking: Reported on 03/02/2020) 60 g 3   ??? halobetasol (ULTRAVATE) 0.05 % ointment Apply topically Two (2) times a day. To psoriasis on the body. 100 g 2   ??? HUMIRA PEN CITRATE FREE 40 MG/0.4 ML Inject the contents of 1 pen (40 mg total) under the skin every fourteen (14) days. 2 each 11   ??? HUMIRA PEN CITRATE FREE STARTER PACK FOR PS/UV 1X 80 MG/0.8 ML, 2X 40 MG/0.4 ML Inject the contents of 1 (80mg ) pen under the skin once, then inject the contents of 1 (40mg ) pen every other week beginning 1 week after initial dose. 3 each 0   ??? levothyroxine (SYNTHROID) 125 MCG tablet Take 125 mcg by mouth daily.     ??? multivitamin, prenatal, folic acid-iron, 27-1 mg Tab Take 1 tablet by mouth daily. 100 tablet 3   ??? triamcinolone (KENALOG) 0.1 % ointment Apply twice a day to areas of psoriasis under wraps until smooth (Patient not taking: Reported on 03/02/2020) 454 g 6     No current facility-administered medications for this visit.       No Known Allergies    Patient Active Problem List   Diagnosis   ??? Type 2 diabetes mellitus (CMS-HCC)   ??? Pre-pregnancy BMI 37.4   ??? 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 Mixed hyperlipidemia   ??? PCOS (polycystic ovarian syndrome)   ??? Psoriasis   ??? Fatty liver   ??? Hypothyroidism   ??? AMA age 28 at delivery   ??? Supervision of high risk pregnancy in second trimester   ??? Encounter for procreative genetic counseling   ??? Nasal polyp   ??? History of cesarean delivery   ??? SAB (spontaneous abortion)       Reviewed and up to date in Epic.    Appropriateness of Therapy     Is medication and dose appropriate based on diagnosis? Yes    Prescription has been clinically reviewed: Yes    Baseline Quality of Life Assessment      How many days over the past month did your psoriasis  keep you from your normal activities? For example, brushing your teeth or getting up in the morning. 0    Financial Information     Medication Assistance provided: Prior Authorization    Anticipated copay of $0 reviewed with patient. Verified delivery address.    Delivery Information     Scheduled delivery date: 05/18/20     Expected start date: 05/18/20    Medication will be delivered via Same Day Courier to the prescription address in [facility].  This shipment will not require a signature.      Explained the services  we provide at [facility] and that each month we would call to set up refills.  Stressed importance of returning phone calls so that we could ensure they receive their medications in time each month.  Informed patient that we should be setting up refills 7-10 days prior to when they will run out of medication.  A pharmacist will reach out to perform a clinical assessment periodically.  Informed patient that a welcome packet and a drug information handout will be sent.      Patient verbalized understanding of the above information as well as how to contact the pharmacy at [phone] option 4 with any questions/concerns.  The pharmacy is open Monday through Friday 8:30am-4:30pm.  A pharmacist is available 24/7 via pager to answer any clinical questions they may have.    Patient Specific Needs     - Does the patient have any physical, cognitive, or cultural barriers? No    - Patient prefers to have medications discussed with  Patient     - Is the patient or caregiver able to read and understand education materials at a high school level or above? Yes    - Patient's primary language is  Spanish     - Is the patient high risk? No     - Does the patient require a Care Management Plan? No     - Does the patient require physician intervention or other additional services (i.e. nutrition, smoking cessation, social work)? No      Barry Faircloth [facility] Specialty Pharmacy Student

## 2020-05-16 MED ORDER — HALOBETASOL PROPIONATE 0.05 % TOPICAL OINTMENT
Freq: Two times a day (BID) | TOPICAL | 2 refills | 0.00000 days | Status: CP
Start: 2020-05-16 — End: 2021-05-16
  Filled 2020-05-18: qty 100, 30d supply, fill #0

## 2020-05-16 NOTE — Unmapped (Signed)
halobetasol refill  Last ov: 09/14/2019   Next ov: 08/07/2020

## 2020-05-17 DIAGNOSIS — L409 Psoriasis, unspecified: Principal | ICD-10-CM

## 2020-05-18 MED FILL — HALOBETASOL PROPIONATE 0.05 % TOPICAL OINTMENT: 30 days supply | Qty: 100 | Fill #0 | Status: AC

## 2020-05-18 MED FILL — EMPTY CONTAINER: 120 days supply | Qty: 1 | Fill #0 | Status: AC

## 2020-05-18 MED FILL — EMPTY CONTAINER: 120 days supply | Qty: 1 | Fill #0

## 2020-05-18 MED FILL — HUMIRA PEN CITRATE FREE STARTER PACK FOR PS/UV 1X 80 MG/0.8 ML, 2X 40 MG/0.4 ML: 35 days supply | Qty: 3 | Fill #0 | Status: AC

## 2020-06-12 NOTE — Unmapped (Signed)
[facility] Clinical Assessment & Refill Coordination Note    Stephanie Hester, DOB: 05/25/1981  Phone: [phone] (home)     All above HIPAA information was verified with patient.     Was a Nurse, learning disability used for this call? Yes, spanish. Patient language is appropriate in [facility]    Specialty Medication(s):   Inflammatory Disorders: Humira     Current Outpatient Medications   Medication Sig Dispense Refill   ??? blood-glucose meter (FREESTYLE INSULINX) Misc Test fasting and one hour after each meal.  Any brand ok. 1 each 0   ??? clobetasoL (TEMOVATE) 0.05 % external solution Apply topically daily. To scalp 50 mL 5   ??? desonide (DESOWEN) 0.05 % ointment Apply topically Two (2) times a day. As needed for rash on the face until clear then stop (Patient not taking: Reported on 03/02/2020) 60 g 3   ??? empagliflozin (JARDIANCE) 10 mg tablet Take 10 mg by mouth daily.     ??? empty container Misc Use as directed to dispose of used Humira pens. 1 each 2   ??? halobetasol (ULTRAVATE) 0.05 % ointment Apply topically Two (2) times a day. To psoriasis on the body. 100 g 2   ??? HUMIRA PEN CITRATE FREE 40 MG/0.4 ML Inject the contents of 1 pen (40 mg total) under the skin every fourteen (14) days. 2 each 11   ??? HUMIRA PEN CITRATE FREE STARTER PACK FOR PS/UV 1X 80 MG/0.8 ML, 2X 40 MG/0.4 ML Inject the contents of 1 (80mg ) pen under the skin once, then inject the contents of 1 (40mg ) pen every other week beginning 1 week after initial dose. 3 each 0   ??? levothyroxine (SYNTHROID) 125 MCG tablet Take 125 mcg by mouth daily.     ??? multivitamin, prenatal, folic acid-iron, 27-1 mg Tab Take 1 tablet by mouth daily. 100 tablet 3   ??? sitaGLIPtin-metFORMIN (JANUMET) 50-1,000 mg per tablet Take 1 tablet by mouth 2 (two) times a day with meals.     ??? triamcinolone (KENALOG) 0.1 % ointment Apply twice a day to areas of psoriasis under wraps until smooth (Patient not taking: Reported on 03/02/2020) 454 g 6     No current facility-administered medications for this visit.        Changes to medications: Stephanie Hester reports no changes at this time.    No Known Allergies    Changes to allergies: No    SPECIALTY MEDICATION ADHERENCE     Humira  : 9 days of medicine on hand       Medication Adherence    Patient reported X missed doses in the last month: 0  Specialty Medication: Humira   Patient is on additional specialty medications: No  Informant: patient  Confirmed plan for next specialty medication refill: delivery by pharmacy  Refills needed for supportive medications: not needed          Specialty medication(s) dose(s) confirmed: Regimen is correct and unchanged.     Are there any concerns with adherence? No    Adherence counseling provided? Not needed    CLINICAL MANAGEMENT AND INTERVENTION      Clinical Benefit Assessment:    Do you feel the medicine is effective or helping your condition? slight decrease in itching.      Clinical Benefit counseling provided? Reasonable expectations discussed: informed patient full effect is not seen until after 2-3 months    Adverse Effects Assessment:    Are you experiencing any side effects?  No    Are you experiencing difficulty administering your medicine? No    Quality of Life Assessment:    How many days over the past month did your psoriasis  keep you from your normal activities? For example, brushing your teeth or getting up in the morning. 0    Have you discussed this with your provider? Not needed    Therapy Appropriateness:    Is therapy appropriate? Yes, therapy is appropriate and should be continued    DISEASE/MEDICATION-SPECIFIC INFORMATION      For patients on injectable medications: Patient currently has 0 doses left.  Next injection is scheduled for 06/22/20.    PATIENT SPECIFIC NEEDS     - Does the patient have any physical, cognitive, or cultural barriers? No    - Is the patient high risk? No    - Does the patient require a Care Management Plan? No     - Does the patient require physician intervention or other additional services (i.e. nutrition, smoking cessation, social work)? No      SHIPPING     Specialty Medication(s) to be Shipped:   Inflammatory Disorders: Humira    Other medication(s) to be shipped: No additional medications requested for fill at this time     Changes to insurance: No    Delivery Scheduled: Yes, Expected medication delivery date: 06/15/20.     Medication will be delivered via Same Day Courier to the confirmed prescription address in [facility].    The patient will receive a drug information handout for each medication shipped and additional FDA Medication Guides as required.  Verified that patient has previously received a Conservation officer, historic buildings.    All of the patient's questions and concerns have been addressed.    Stephanie Hester   [facility] Specialty Pharmacist

## 2020-06-15 DIAGNOSIS — L409 Psoriasis, unspecified: Principal | ICD-10-CM

## 2020-06-15 MED FILL — HUMIRA PEN CITRATE FREE 40 MG/0.4 ML: SUBCUTANEOUS | 28 days supply | Qty: 2 | Fill #0

## 2020-06-15 MED FILL — HUMIRA PEN CITRATE FREE 40 MG/0.4 ML: 28 days supply | Qty: 2 | Fill #0 | Status: AC

## 2020-07-13 DIAGNOSIS — L409 Psoriasis, unspecified: Principal | ICD-10-CM

## 2020-07-13 NOTE — Unmapped (Signed)
[facility] Refill Coordination Note    Specialty Medication(s) to be Shipped:   Inflammatory Disorders: Humira    Other medication(s) to be shipped: Clobetasol external solution (sent rf request)     Stephanie Hester, DOB: [birth year]  Phone: [phone] (home)       All above HIPAA information was verified with patient.     Was a Nurse, learning disability used for this call? No    Completed refill call assessment today to schedule patient's medication shipment from the [facility] [phone]).       Specialty medication(s) and dose(s) confirmed: Regimen is correct and unchanged.   Changes to medications: Karen reports no changes at this time.  Changes to insurance: No  Questions for the pharmacist: No    Confirmed patient received Welcome Packet with first shipment. The patient will receive a drug information handout for each medication shipped and additional FDA Medication Guides as required.       DISEASE/MEDICATION-SPECIFIC INFORMATION        For patients on injectable medications: Patient currently has 0 doses left.  Next injection is scheduled for 07/20/2020.    SPECIALTY MEDICATION ADHERENCE     Medication Adherence    Patient reported X missed doses in the last month: 0  Specialty Medication: Humira Cf 40 mg/0.4 ml   Patient is on additional specialty medications: No  Any gaps in refill history greater than 2 weeks in the last 3 months: no  Demonstrates understanding of importance of adherence: yes  Informant: patient  Reliability of informant: reliable  Confirmed plan for next specialty medication refill: delivery by pharmacy  Refills needed for supportive medications: yes, ordered or provider notified                      SHIPPING     Shipping address confirmed in Epic.     Delivery Scheduled: Yes, Expected medication delivery date: 07/16/2020.     Medication will be delivered via Same Day Courier to the prescription address in Epic [facility].    Stephanie Hester   [facility] Specialty Technician

## 2020-07-13 NOTE — Unmapped (Signed)
clobetasol refill  Last ov: 05/01/2020  Next ov: 08/07/2020

## 2020-07-16 MED ORDER — CLOBETASOL 0.05 % SCALP SOLUTION
Freq: Every day | TOPICAL | 5 refills | 0.00000 days | Status: CP
Start: 2020-07-16 — End: 2021-07-16
  Filled 2020-07-16: qty 50, 25d supply, fill #0

## 2020-07-16 MED FILL — HUMIRA PEN CITRATE FREE 40 MG/0.4 ML: SUBCUTANEOUS | 28 days supply | Qty: 2 | Fill #1

## 2020-07-16 MED FILL — HUMIRA PEN CITRATE FREE 40 MG/0.4 ML: 28 days supply | Qty: 2 | Fill #1 | Status: AC

## 2020-07-16 MED FILL — CLOBETASOL 0.05 % SCALP SOLUTION: 25 days supply | Qty: 50 | Fill #0 | Status: AC

## 2020-08-06 DIAGNOSIS — L509 Urticaria, unspecified: Principal | ICD-10-CM

## 2020-08-06 MED ORDER — TRIAMCINOLONE ACETONIDE 0.1 % TOPICAL CREAM
Freq: Two times a day (BID) | TOPICAL | 3 refills | 0.00000 days | Status: CP
Start: 2020-08-06 — End: 2021-08-06

## 2020-08-06 NOTE — Unmapped (Signed)
Called and spoke to patient who states she would like for me to follow up with her on 10/13 as she has an appointment with Derm on 10/12 and states something may change with regimen. Rescheduling call.

## 2020-08-07 ENCOUNTER — Ambulatory Visit
Admit: 2020-08-07 | Discharge: 2020-08-08 | Payer: PRIVATE HEALTH INSURANCE | Attending: Dermatology | Primary: Dermatology

## 2020-08-07 DIAGNOSIS — L409 Psoriasis, unspecified: Principal | ICD-10-CM

## 2020-08-07 DIAGNOSIS — Z79899 Other long term (current) drug therapy: Principal | ICD-10-CM

## 2020-08-07 DIAGNOSIS — L239 Allergic contact dermatitis, unspecified cause: Principal | ICD-10-CM

## 2020-08-07 MED ORDER — CLOBETASOL 0.05 % TOPICAL OINTMENT
OPHTHALMIC | 2 refills | 0.00000 days | Status: CP
Start: 2020-08-07 — End: ?
  Filled 2020-08-07: qty 60, 14d supply, fill #0

## 2020-08-07 MED FILL — CLOBETASOL 0.05 % TOPICAL OINTMENT: 14 days supply | Qty: 60 | Fill #0 | Status: AC

## 2020-08-07 NOTE — Unmapped (Signed)
Dermatology Note     Assessment and Plan:        Psoriasis  - Patient has previously tried and failed topical steroids  - Patient is now pregnant (did have recent miscarriage prior to starting adalimumab). She was originally prescribed ustekinumab but this was denied  - Patient has been taking Adalimumab (stopped 1 dose due to new rash). Discussed that from dermatologic perspective, we are agreeable to continuing this medication during pregnancy but defer to OBGYN. Patient plans to establish care with high risk OBGYN for her pre natal care  - recommend start clobetasol 0.05% ointment bid to affected area until flat and smooth    High risk medication use: Adalimumab  - will screen with Quantiferon, HBV, HCV today  - patient has received both Covid vaccinations    Allergic contact dermatitis, unspecified trigger  - Given linear distribution on arms, favor ACD  - recommend start clobetasol 0.05% ointment bid to affected area until flat and smooth  - may use antihistamines OTC such as cetirizine or diphenhydramine prn for itching      The patient was advised to call for an appointment should any new, changing, or symptomatic lesions develop.     RTC: Return in about 3 months (around 11/07/2020) for Recheck. or sooner as needed   _________________________________________________________________      Chief Complaint     Chief Complaint   Patient presents with   ??? Follow-up     Has HAD covid vaccine       HPI     Stephanie Hester is a 39 y.o. female who presents as a returning patient (last seen 05/01/2020) to [facility] for follow up of psoriasis and new rash.     Patient reports that she has started Humira - Stelara was denied. She has taking ~ 5 total injections. Persistent areas of psoriasis are the extensor knees and elbows. Uses clobetasol intermittently to these areas, as well. Denies joint pain, stiffness or swelling.     Also reports new onset rash on the arms and chest. This started about 1 week ago. It is red, raised and itching. She has not treated this previously. Does endorse time in garden playing with her child. Unclear whether she was exposed to poison ivy    The patient denies any other new or changing lesions or areas of concern.     Pertinent Past Medical History     No history of skin cancer    Problem List        Musculoskeletal and Integument    Psoriasis - Primary     Current provider: Dr. Donny Pique at [facility] (last seen 02/28/20)    Current regimen:  - Continue clobetasoL (TEMOVATE) 0.05 % external solution; Apply topically daily. To scalp  - Continue halobetasol (ULTRAVATE) 0.05 % ointment; Apply topically Two (2) times a day. To psoriasis on the body.  - Discussed the common side effects of topical steroids including epidermal atrophy, striae formation and hypopigmentation; pt instructed not to place medication on face, armpits or groin   - Max use of class 1 potency topical steroids is 300g over the course of pregnancy. The above prescriptions including refills equals this amount.  - Recommend over the counter coal tar 2 times daily  - Recommend natural sunlight for 5 minutes 3 times weekly. Patient is obtaining medicaid for pregnancy, but this will not likely cover the cost of a home nbUVB light box. - Would not recommend Cimzia given sister's history  of severe dermatomyositis and strong association with TNF-alpha inhibitors.   -consider ustekinumab in the future after pregnancy.            Relevant Medications    clobetasoL (TEMOVATE) 0.05 % ointment          Family History:   sister with DM, follows with [facility]    Past Medical History, Family History, Social History, Medication List, Allergies, and Problem List were reviewed in the rooming section of Epic.     ROS: Other than symptoms mentioned in the HPI, no fevers, chills, or other skin complaints    Physical Examination     GENERAL: Well-appearing female in no acute distress, resting comfortably.  NEURO: Alert and oriented, answers questions appropriately  SKIN (Waist Up Skin Exam): Per patient request, exam of the scalp, face, eyelids, lips, nose, ears, neck, chest, upper abdomen, back, arms, hands, and fingernails was performed  L inner arm and chest with pink, inflammatory papules that coalesce into linear plaques  R shoulder with 3 firm, pink somewhat indurated plaques without scale    All areas not commented on are within normal limits or unremarkable      (Approved Template 07/09/2020)

## 2020-08-07 NOTE — Unmapped (Addendum)
The rash is most likely due to an allergen coming into contact with your skin. Start applying the clobetasol cream to the new rash twice daily for 2 weeks (or until rash resolves)

## 2020-08-09 NOTE — Unmapped (Signed)
[facility] Refill Coordination Note    Specialty Medication(s) to be Shipped:   Inflammatory Disorders: Humira    Other medication(s) to be shipped: Clobetasol external solution and Clobetasol ointment     Stephanie Hester, DOB: 1981/02/18  Phone: [phone] (home)       All above HIPAA information was verified with patient.     Was a Nurse, learning disability used for this call? No    Completed refill call assessment today to schedule patient's medication shipment from the [facility] [phone]).       Specialty medication(s) and dose(s) confirmed: Regimen is correct and unchanged.   Changes to medications: [name] reports no changes at this time.  Changes to insurance: No  Questions for the pharmacist: No    Confirmed patient received Welcome Packet with first shipment. The patient will receive a drug information handout for each medication shipped and additional FDA Medication Guides as required.       DISEASE/MEDICATION-SPECIFIC INFORMATION        For patients on injectable medications: Patient currently has 0 doses left.  Next injection is scheduled for 08/17/2020.    SPECIALTY MEDICATION ADHERENCE     Medication Adherence    Patient reported X missed doses in the last month: 0  Specialty Medication: Humira Cf 40 mg/0.4 ml   Patient is on additional specialty medications: No  Any gaps in refill history greater than 2 weeks in the last 3 months: no  Demonstrates understanding of importance of adherence: yes  Informant: patient  Reliability of informant: reliable  Confirmed plan for next specialty medication refill: delivery by pharmacy  Refills needed for supportive medications: not needed                      SHIPPING     Shipping address confirmed in Epic.     Delivery Scheduled: Yes, Expected medication delivery date: 08/17/2020.     Medication will be delivered via Same Day Courier to the prescription address in Epic WAM.    Stephanie Hester Stephanie Hester Stephanie Hester   [facility] Specialty Technician

## 2020-08-10 LAB — HIV ANTIGEN/ANTIBODY COMBO: HIV 1+2 Ab+HIV1 p24 Ag:PrThr:Pt:Ser/Plas:Ord:IA: NONREACTIVE

## 2020-08-10 LAB — HEPATITIS C ANTIBODY: Hepatitis C virus Ab:PrThr:Pt:Ser:Ord:: NONREACTIVE

## 2020-08-10 LAB — HEPATITIS B CORE TOTAL ANTIBODY: Hepatitis B virus core Ab:PrThr:Pt:Ser/Plas:Ord:IA: NONREACTIVE

## 2020-08-10 LAB — HEPATITIS B SURFACE ANTIBODY QUANT: Hepatitis B virus surface Ab:ACnc:Pt:Ser:Qn:: <8

## 2020-08-10 NOTE — Unmapped (Signed)
I saw and evaluated the patient, participating in the key portions of the service.  I reviewed the resident???s note.  I agree with the resident???s findings and plan.    Louann Liv, MD

## 2020-08-13 LAB — QUANTIFERON TB GOLD PLUS
Lab: NEGATIVE
QUANTIFERON ANTIGEN 1 MINUS NIL: 0.03 [IU]/mL
QUANTIFERON MITOGEN: 9.82 [IU]/mL
QUANTIFERON TB GOLD PLUS: NEGATIVE
QUANTIFERON TB NIL VALUE: 0.18 [IU]/mL

## 2020-08-13 LAB — TB AG1 VALUE: Lab: 0.21

## 2020-08-13 LAB — TB MITOGEN VALUE: Lab: 10

## 2020-08-13 LAB — TB AG2 VALUE: Lab: 0.16

## 2020-08-13 LAB — TB NIL VALUE: Lab: 0.18

## 2020-08-14 NOTE — Unmapped (Signed)
Labs reassuring. Notified patient via MyChart

## 2020-08-17 MED FILL — CLOBETASOL 0.05 % SCALP SOLUTION: 25 days supply | Qty: 50 | Fill #1 | Status: AC

## 2020-08-17 MED FILL — CLOBETASOL 0.05 % TOPICAL OINTMENT: 14 days supply | Qty: 60 | Fill #0

## 2020-08-17 MED FILL — CLOBETASOL 0.05 % TOPICAL OINTMENT: 14 days supply | Qty: 60 | Fill #0 | Status: AC

## 2020-08-17 MED FILL — CLOBETASOL 0.05 % SCALP SOLUTION: TOPICAL | 25 days supply | Qty: 50 | Fill #1

## 2020-08-17 MED FILL — HUMIRA PEN CITRATE FREE 40 MG/0.4 ML: 28 days supply | Qty: 2 | Fill #2 | Status: AC

## 2020-08-17 MED FILL — HUMIRA PEN CITRATE FREE 40 MG/0.4 ML: SUBCUTANEOUS | 28 days supply | Qty: 2 | Fill #2

## 2020-08-30 NOTE — Unmapped (Signed)
Patient is currently 9 wks 0 days with EDD of 04/04/2021. LMP 06/28/2020.   Her last pap smear was 2020 negative per patient. Patient had varicella during childhood. New to nurse interview completed with patient. History obtained and abstracted to Epic. All questions answered. Patient was transferred to MFM to schedule initial ob appointment with MD and viability ultrasound, warm hand off to Alejandra.

## 2020-09-03 NOTE — Unmapped (Signed)
[facility] Refill Coordination Note    Specialty Medication(s) to be Shipped:   Inflammatory Disorders: Humira    Other medication(s) to be shipped: Clobetasol external solution      Stephanie Hester, DOB: April 03, 1981  Phone: [phone] (home)       All above HIPAA information was verified with patient.     Was a Nurse, learning disability used for this call? No    Completed refill call assessment today to schedule patient's medication shipment from the [facility] [phone]).       Specialty medication(s) and dose(s) confirmed: Regimen is correct and unchanged.   Changes to medications: Stephanie Hester reports no changes at this time.  Changes to insurance: No  Questions for the pharmacist: No    Confirmed patient received Welcome Packet with first shipment. The patient will receive a drug information handout for each medication shipped and additional FDA Medication Guides as required.       DISEASE/MEDICATION-SPECIFIC INFORMATION        For patients on injectable medications: Patient currently has 0 doses left.  Next injection is scheduled for 09/14/2020.    SPECIALTY MEDICATION ADHERENCE     Medication Adherence    Patient reported X missed doses in the last month: 0  Specialty Medication: Humira Cf 40 mg/0.4 ml   Patient is on additional specialty medications: No  Any gaps in refill history greater than 2 weeks in the last 3 months: no  Demonstrates understanding of importance of adherence: yes  Informant: patient  Reliability of informant: reliable  Confirmed plan for next specialty medication refill: delivery by pharmacy  Refills needed for supportive medications: not needed                      SHIPPING     Shipping address confirmed in Epic.     Delivery Scheduled: Yes, Expected medication delivery date: 09/10/2020.     Medication will be delivered via Same Day Courier to the prescription address in Epic WAM.    Vallie Teters D Arnoldo Hildreth   [facility] Specialty Technician

## 2020-09-10 MED FILL — CLOBETASOL 0.05 % SCALP SOLUTION: TOPICAL | 25 days supply | Qty: 50 | Fill #2

## 2020-09-10 MED FILL — CLOBETASOL 0.05 % SCALP SOLUTION: 25 days supply | Qty: 50 | Fill #2 | Status: AC

## 2020-09-10 MED FILL — HUMIRA PEN CITRATE FREE 40 MG/0.4 ML: SUBCUTANEOUS | 28 days supply | Qty: 2 | Fill #3

## 2020-09-10 MED FILL — HUMIRA PEN CITRATE FREE 40 MG/0.4 ML: 28 days supply | Qty: 2 | Fill #3 | Status: AC

## 2020-09-12 ENCOUNTER — Encounter: Admit: 2020-09-12 | Discharge: 2020-09-13 | Payer: PRIVATE HEALTH INSURANCE

## 2020-09-12 ENCOUNTER — Ambulatory Visit: Admit: 2020-09-12 | Discharge: 2020-09-13 | Payer: PRIVATE HEALTH INSURANCE

## 2020-09-12 DIAGNOSIS — E1100 Type 2 diabetes mellitus with hyperosmolarity without nonketotic hyperglycemic-hyperosmolar coma (NKHHC): Principal | ICD-10-CM

## 2020-09-12 DIAGNOSIS — O3671X0 Maternal care for viable fetus in abdominal pregnancy, first trimester, not applicable or unspecified: Principal | ICD-10-CM

## 2020-09-12 DIAGNOSIS — E039 Hypothyroidism, unspecified: Principal | ICD-10-CM

## 2020-09-12 DIAGNOSIS — O0992 Supervision of high risk pregnancy, unspecified, second trimester: Principal | ICD-10-CM

## 2020-09-12 DIAGNOSIS — O09529 Supervision of elderly multigravida, unspecified trimester: Principal | ICD-10-CM

## 2020-09-12 DIAGNOSIS — Q51810 Arcuate uterus: Principal | ICD-10-CM

## 2020-09-12 DIAGNOSIS — L409 Psoriasis, unspecified: Principal | ICD-10-CM

## 2020-09-12 DIAGNOSIS — Z6837 Body mass index (BMI) 37.0-37.9, adult: Principal | ICD-10-CM

## 2020-09-12 DIAGNOSIS — Z98891 History of uterine scar from previous surgery: Principal | ICD-10-CM

## 2020-09-12 DIAGNOSIS — K760 Fatty (change of) liver, not elsewhere classified: Principal | ICD-10-CM

## 2020-09-12 LAB — PROTEIN / CREATININE RATIO, URINE
CREATININE, URINE: 21.3 mg/dL
PROTEIN URINE: <6 mg/dL

## 2020-09-12 LAB — CBC
HEMATOCRIT: 43.9 % (ref 35.0–44.0)
HEMOGLOBIN: 14.3 g/dL (ref 12.0–15.5)
MEAN CORPUSCULAR HEMOGLOBIN CONC: 32.5 g/dL (ref 30.0–36.0)
MEAN CORPUSCULAR HEMOGLOBIN: 28.7 pg (ref 26.0–34.0)
MEAN CORPUSCULAR VOLUME: 88.2 fL (ref 82.0–98.0)
MEAN PLATELET VOLUME: 8.2 fL (ref 7.0–10.0)
PLATELET COUNT: 304 10*9/L (ref 150–450)
RED BLOOD CELL COUNT: 4.98 10*12/L (ref 3.90–5.03)
RED CELL DISTRIBUTION WIDTH: 15.7 % — ABNORMAL HIGH (ref 12.0–15.0)
WBC ADJUSTED: 8.6 10*9/L (ref 3.5–10.5)

## 2020-09-12 LAB — HEPATITIS B SURFACE ANTIGEN: HEPATITIS B SURFACE ANTIGEN: NONREACTIVE

## 2020-09-12 LAB — ALT: ALT (SGPT): 41 U/L (ref 10–49)

## 2020-09-12 LAB — AST: AST (SGOT): 30 U/L (ref <34)

## 2020-09-12 LAB — CREATININE
CREATININE: 0.63 mg/dL (ref 0.55–1.02)
EGFR CKD-EPI AA FEMALE: >90 mL/min/{1.73_m2}
EGFR CKD-EPI NON-AA FEMALE: >90 mL/min/{1.73_m2}

## 2020-09-12 LAB — LACTATE DEHYDROGENASE: LACTATE DEHYDROGENASE: 133 U/L (ref 120–246)

## 2020-09-12 LAB — HEPATITIS C ANTIBODY: HEPATITIS C ANTIBODY: NONREACTIVE

## 2020-09-12 LAB — HIV ANTIGEN/ANTIBODY COMBO: HIV ANTIGEN/ANTIBODY COMBO: NONREACTIVE

## 2020-09-12 LAB — TSH: THYROID STIMULATING HORMONE: 1.782 u[IU]/mL (ref 0.550–4.780)

## 2020-09-12 MED ORDER — GLUCAGON EMERGENCY KIT 1 MG SOLUTION FOR INJECTION
1 refills | 0 days | Status: CP
Start: 2020-09-12 — End: ?

## 2020-09-12 MED ORDER — INSULIN SYRINGE U-100 WITH NEEDLE 1 ML 31 X 3/8" (10 MM)
3 refills | 0 days | Status: CP
Start: 2020-09-12 — End: ?

## 2020-09-12 MED ORDER — INSULIN NPH ISOPHANE U-100 HUMAN 100 UNIT/ML SUBCUTANEOUS SUSPENSION
3 refills | 0 days | Status: CP
Start: 2020-09-12 — End: ?

## 2020-09-12 NOTE — Unmapped (Addendum)
History of type II DM:  - Diagnosis: type II DM, diagnosed in 2015 (also has h/o PCOS)  - PCP: Leonette Most Drew/[facility]  - Current regimen: Metformin 1000 mg BID  - Recent HgbA1c: 6.1%-->repeated today, results pending  - H/o DKA: Denies  - Glucagon kit: Rx given  - Medic alert bracelet/necklace: encouraged  - Foot exam: encouraged at least monthly  - Baseline retinal eye exam: recommended if not obtained within last 6 months-->referral placed today, appt pending  - Baseline EKG: recommended if not obtained within last 6 months-->order placed and given to pt today  - H/o retinopathy: Denies  - H/o neuropathy: Denies  - H/o nephropathy: Denies  - H/o hypoglycemic unawareness: denies    Pt did not bring glucose log today. Admits she is performing glucose checks fasting and 2 hour after lunch only. Reports the following recent glucose ranges:    Fasting: 111-142 mg/dl  2 hour pp breakfast: no values  2 hour pp lunch: 140-150 mg/dl  2 hour pp dinner: no values    She is not following a diabetic meal plan at this time. RBG today: 123 mg/dl. 1+ ketonuria.    -We discussed our recommendation to discontinue metformin therapy in favor of insulin today. Pt is fearful, as she happened to suffer a miscarriage soon after initiating insulin during last pregnancy. Discussed that insulin did not cause miscarriage. Reviewed importance of good glycemic control to promote health of mother and pregnancy. Reviewed potential maternal/fetal risks of poor glycemic control with the patient today, including miscarriage, fetal anomalies, macrosomia, polyhydramnios, IUFD, delayed fetal lung maturity as well as neonatal hypoglycemia, hyperbilirubinemia and electrolyte imbalances. We also reviewed the increased risk of maternal preeclampsia in the setting of diabetes. After extensive discussion, she agrees to discontinue metformin in favor of low dose NPH therapy. Will begin NPH 20 units subcutaneous before breakfast, 10 units at bedtime. Will plan virtual prenatal visit in 2 weeks to ensure improving glycemic control. She underwent insulin teaching today with our clinic nurse.   -Will schedule consult with nutrition ASAP. Nutritional education packet shared with the pt today.   -Reviewed goals for blood glucose values including fasting blood glucose less than 95 mg/dl and now 1 hour postprandial blood glucose values less than 140 mg/dl. She agrees to begin performing glucose checks QID.  -Pt has a home glucose meter and adequate meter supplies. Reviewed the importance of good glucose control and frequent glucose checks.     -Nutritional guidelines, recommended maternal weight gain for pregnancy reviewed with patient today.  -Advised initiation of low dose aspirin therapy for preeclampsia risk-reduction. She will begin today.  -Will obtain baseline HELLP labs, urine p:c ratio today.   -Plan HgbA1c q trimester. Goal of 6.0% or less.  -Plan targeted ultrasound at 19-20 weeks, fetal echocardiogram at 22-24 weeks.  -Plan daily FM counts at 28 weeks.  -Plan serial ultrasounds to assess fetal growth every 3-4 weeks in the third trimester.  -Plan weekly antenatal testing at 32 weeks, increasing to twice weekly at 36 weeks.(Since there is no consensus for antenatal testing in pregnancy complicated by diabetes, these are [facility] recommendations. Other acceptable forms of monitoring at 32-[redacted] weeks gestation include weekly BPP or twice weekly NST).   -Anticipate delivery in the 37-39th week of pregnancy, unless a maternal/fetal indication for delivery occurs prior to that time. Ultimately, timing of delivery should be personalized based on fetal status and maternal glucose control.  -Plan referral back to PCP postpartum to continue management  of type II DM.

## 2020-09-12 NOTE — Unmapped (Addendum)
Dating: EDD 04/04/21 by LMP c/w 10 week ultrasound                                           First trimester:   [x]  Prenatal labs reviewed   [ ]  Maternal CF carrier screening-->pt considering (brochure given), review again at next visit  [ ]  Maternal SMA carrier screening-->pt considering (brochure given), review again at next visit  [ ]  Hgb electrophoresis-->pt considering (brochure given), review again at next visit  [x]  Medicaid patients: Pregnancy Medical Home Risk Screen (Code [phone])  Third trimester:   [ ]  CBC/RPR/HIV  [ ]  GBS (GC/CT in high risk patients)  Fetal screening:   [ ]  Desires cfDNA-->drawn today, result pending   [ ]  Targeted ultrasound at 19-20 weeks  [ ]  Fetal echocardiogram at 22-24 weeks  [ ]  Daily FM counts at 28 weeks  [ ]  Serial ultrasounds every 4 weeks in the third trimester to assess fetal growth  [ ]  Weekly at 32 weeks, twice weekly at 36 weeks  Goal weight gain: 11 to 20 lbs (5 to 9.0 kg) for obese pregravid BMI 37.43  Vaccination:   [ ]  Tdap at >27 weeks  [x]  COVID-19 - s/p 2nd dose of the Moderna vaccine on 04/04/20  [ ]  Flu shot declined on 09/12/20, but may consider at next visit  [ ]  Rubella-->obtained today, results pending   [ ]  Varicella-->obtained today, results pending   [x]  Rh positive, Rhogam not indicated (O positive)  Delivery plan:   [ ]  Pt desires repeat cesarean section for delivery. Timing of delivery is pending future maternal glycemic control.  Postpartum:  [x]  Infant feeding: breast and bottle  [ ]  Circumcision* (Links: How to Prepay; Patient Education)  [x]  Last pap: negative on 08/16/19  [ ]  Contraception: desires BTL-->plan to sign consent at future visit   [ ]  Medicaid patients: Transition to Primary Care (Code [phone])*    Legend:  [x]  = Completed  [--] = Not applicable   [ ]  = Incomplete   * = If indicated     -Overview reviewed and updated-->please see for full assessment and plan. Up to date.    -Patient desires NIPS and would like her niece to know the gender for a gender reveal.   -Patient desires BTL, she is Medicaid insured. Will revisit at future visits.    -S/P 2nd dose of Moderna vaccine (04/04/20) with no history of COVID-19 infection. She declined influenza vaccination today, but may consider at next visit.

## 2020-09-12 NOTE — Unmapped (Signed)
Pt did not bring a glucose log with her today. She also states she is not keeping a at this time. Pt states she fasting glucose was 143 today. I took a random glucose in the office today and it was 127 and the only thing she has had today was a small glass of milk.

## 2020-09-12 NOTE — Unmapped (Addendum)
Pregnancy history:  1. 2019: [redacted]w[redacted]d primary LTCS of 5 lb 15 oz female infant complicated by fetal intolerance of labor, maternal type II DM.  2. 2021: first trimester SAB  3. Current pregnancy    We reviewed the pros and cons of repeat cesarean delivery vs. Trial of labor after c-section. Successful vaginal delivery after cesarean delivery is increased with spontaneous onset of labor. Risk factors for unsuccessful trial of labor, including maternal BMI, age, and prior vaginal deliveries were also discussed.     For TOLAC, maintain working IV, continuous fetal monitoring, and immediate obstetric and anesthesia availability. If these are not available, repeat cesarean delivery may be preferred.     Risks for uterine rupture and failed trial of labor are also increased with induction of labor. Often individual management may include attempt trial of labor with spontaneous labor, but repeat cesarean in the event of an indication for IOL (post term, maternal or fetal indication for delivery e.g. preeclampsia). However, Induction of labor remains an option in women undergoing TOLAC. Recommend avoidance of Misoprostol for cervical ripening. ACOG supports attempted trial of labor with one or two prior cesarean deliveries including. Women with one previous cesarean delivery with a low-transverse incision, who are otherwise appropriate candidates for twin vaginal delivery, are considered candidates for TOLAC.  Documentation of prior LTCS incision for prior cesarean deliveries is recommended and repeat cesarean would be recommended if both are not LTCS.  Women with one previous cesarean delivery with an unknown uterine scar type may be candidates for TOLAC, unless there is a high clinical suspicion of a previous classical uterine incision such as cesarean delivery performed at an extremely preterm gestation age.    History of cesarean section also increases the risk for placenta previa and accreta, thus we recommend antenatal [place] for placental location.     In summary:   After discussion, patient desires repeat c-section with BTL  Repeat c-section is planned    Reference: ACOG Practice Bulletin 205: Vaginal Birth After Cesarean Delivery, February 2019

## 2020-09-12 NOTE — Unmapped (Addendum)
Currently taking Adalimumab q 2 weeks with good response. Followed by [facility]-->last visit on 08/07/20. Recommendations at that time:    Psoriasis  - Patient has previously tried and failed topical steroids  - Patient is now pregnant (did have recent miscarriage prior to starting adalimumab). She was originally prescribed ustekinumab but this was denied  - Patient has been taking Adalimumab (stopped 1 dose due to new rash). Discussed that from dermatologic perspective, we are agreeable to continuing this medication during pregnancy but defer to OBGYN. Patient plans to establish care with high risk OBGYN for her pre natal care  - recommend start clobetasol 0.05% ointment bid to affected area until flat and smooth    Tumor necrosis factor (TNF)-alpha blockers may be used in women who require these medications for the maintenance or establishment of control of active inflammatory disease during pregnancy. Some expert guidance suggests these medications should be discontinued in the late second or early third trimester. However, use of these drugs can be extended, if necessary, to a later gestational age if benefits outweigh potential risks for an individual patient.     -08/07/20: Quant gold negative, HIV: non-reactive, Hep C ab: non-reactive, Hep B surf ab: non-reactive, Hep B core ab: non-reactive  -Pt is s/p 2nd dose of Moderna COVID-19 vaccination. Considering influenza vaccination at next visit (declined today).  -Will continue Adalimumab at this time  -Follow-up with dermatology on 11/13/20

## 2020-09-12 NOTE — Unmapped (Signed)
Insulin teaching completed today with patient using syringe and vial. We discussed signs and symptoms of hyper and hypoglycemia, when to call and when to treat. We discussed rule of 15's, sick day care and glucagon kit use. Patient successfully performed teach back demonstration for self administration of insulin. Answered patient's questions.

## 2020-09-12 NOTE — Unmapped (Signed)
Provider: Sharyne Peach, [facility]    Division: Maternal-Fetal Medicine      Maternal-Fetal Medicine New Obstetrical Visit                                                                 History present of Illness   Ms. Stephanie Hester is an 39 y.o. G3P1011 at [redacted]w[redacted]d with Estimated Date of Delivery: 04/04/21, who is seen in for a transfer of care visit. She denies vaginal bleeding, loss of fluid, or cramping.    Obstetrical History:  As below    Gynecologic History:   No hx of abnormal paps, last pap smear 08/16/19. Denies hx of sexually transmitted illnesses in particular herpes simplex virus.    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  -Plans to breast and bottle feed    Family History  -Mother: diabetes  -Sister: diabetes    Medications  -PNV every day   -Levothyroxine 137 mcg every day   -Metformin 1000 mg BID   -Humira q 2 weeks    Allergies  -NKDA     Review of Systems  A complete ROS was performed with pertinent positives/negatives noted in the HPI. All other systems reviewed were negative.    Objective  Temp: 36.5 ??C (97.7 ??F)  Heart Rate: 89  BP: 83/54  Urine Glucose: Negative  Urine Protein: Negative  Movement: Absent  Loss of Fluid: No  Bleeding: No  Contractions: Not present  Fetal Heart Rate: present by u/s today  LE Edema: No  Dilation: Closed  Effacement (%): 0    General Appearance No acute distress, well appearing, and well nourished.   Pulmonary Normal work of breathing.   Cardiovascular Normal rate.  Edema none.      Gastronintestinal Abdomen soft, gravid, no rebound or guarding.   Genitourinary SVE: closed/long/high  External Genitalia: Normal female genitalia  Urethral Meatus: Normal caliber and position  Urethra: Midline, no masses  Bladder: Well-suspended, nontender  Vagina: Well-rugated, There is no vaginal discharge., no lesions.  Cervix: No lesions, normal size and consistency; no cervical motion tenderness   Adnexa/Parametria: No masses; no parametrial nodularity; no tenderness  Uterus:Gravid; nontender   Muskuloskeletal Normal gait, grossly normal range of motion   Neurologic Alert and oriented to person, place, and time   Psychiatric  Integumentary Mood and affect within normal limits  Skin is warm, dry, no rash noted         Assessment and Plan:  Stephanie Hester is a 46 y.o. G3P1011 at [redacted]w[redacted]d who is seen to establish/transfer prenatal care.     Supervision of high risk pregnancy, antepartum  Dating: EDD 04/04/21 by LMP c/w 10 week ultrasound                                           First trimester:   [x]  Prenatal labs reviewed   [ ]  Maternal  CF carrier screening-->pt considering (brochure given), review again at next visit  [ ]  Maternal SMA carrier screening-->pt considering (brochure given), review again at next visit  [ ]  Hgb electrophoresis-->pt considering (brochure given), review again at next visit  [x]  Medicaid patients: Pregnancy Medical Home Risk Screen (Code [phone])  Third trimester:   [ ]  CBC/RPR/HIV  [ ]  GBS (GC/CT in high risk patients)  Fetal screening:   [ ]  Desires cfDNA-->drawn today, result pending   [ ]  Targeted ultrasound at 19-20 weeks  [ ]  Fetal echocardiogram at 22-24 weeks  [ ]  Daily FM counts at 28 weeks  [ ]  Serial ultrasounds every 4 weeks in the third trimester to assess fetal growth  [ ]  Weekly at 32 weeks, twice weekly at 36 weeks  Goal weight gain: 11 to 20 lbs (5 to 9.0 kg) for obese pregravid BMI 37.43  Vaccination:   [ ]  Tdap at >27 weeks  [x]  COVID-19 - s/p 2nd dose of the Moderna vaccine on 04/04/20  [ ]  Flu shot declined on 09/12/20, but may consider at next visit  [ ]  Rubella-->obtained today, results pending   [ ]  Varicella-->obtained today, results pending   [x]  Rh positive, Rhogam not indicated (O positive)  Delivery plan:   [ ]  Pt desires repeat cesarean section for delivery. Timing of delivery is pending future maternal glycemic control.  Postpartum:  [x]  Infant feeding: breast and bottle  [ ]  Circumcision* (Links: How to Prepay; Patient Education)  [x]  Last pap: negative on 08/16/19  [ ]  Contraception: desires BTL-->plan to sign consent at future visit   [ ]  Medicaid patients: Transition to Primary Care (Code [phone])*    Legend:  [x]  = Completed  [--] = Not applicable   [ ]  = Incomplete   * = If indicated     -Overview reviewed and updated-->please see for full assessment and plan. Up to date.    -Patient desires NIPS and would like her niece to know the gender for a gender reveal.   -Patient desires BTL, she is Medicaid insured. Will revisit at future visits.    -S/P 2nd dose of Moderna vaccine (04/04/20) with no history of COVID-19 infection. She declined influenza vaccination today, but may consider at next visit.     Rheumatoid arthritis (CMS-HCC)  -Currently on Humira. Started earlier this year    Psoriasis  -On Humira     Antepartum multigravida of advanced maternal age  Pt will be 39 yo at EDD.    We discussed the patient's age related risk of aneuploidy.  We discussed screening options including serum screening and NIPT as well as diagnostic options of CVS and amniocentesis.  We discussed risks, benefits and diagnostic capability of both CVS and amniocentesis.  She expressed interest in cfDNA.    -Patient desires cfDNA after counseling today-->drawn on 09/12/20, results pending (planning gender reveal, send Epic message only with gender)  -Plan targeted ultrasound at 19-20 weeks     Type 2 diabetes mellitus (CMS-HCC)  History of type II DM:  - Diagnosis: type II DM, diagnosed in 2015 (also has h/o PCOS)  - PCP: Leonette Most Drew/[facility]  - Current regimen: Metformin 1000 mg BID  - Recent HgbA1c: 6.1%-->repeated today, results pending  - H/o DKA: Denies  - Glucagon kit: Rx given  - Medic alert bracelet/necklace: encouraged  - Foot exam: encouraged at least monthly  - Baseline retinal eye exam: recommended if not obtained within last 6 months-->referral placed today, appt pending  -  Baseline EKG: recommended if not obtained within last 6 months-->order placed and given to pt today  - H/o retinopathy: Denies  - H/o neuropathy: Denies  - H/o nephropathy: Denies  - H/o hypoglycemic unawareness: denies    Pt did not bring glucose log today. Admits she is performing glucose checks fasting and 2 hour after lunch only. Reports the following recent glucose ranges:    Fasting: 111-142 mg/dl  2 hour pp breakfast: no values  2 hour pp lunch: 140-150 mg/dl  2 hour pp dinner: no values    She is not following a diabetic meal plan at this time. RBG today: 123 mg/dl. 1+ ketonuria.    -We discussed our recommendation to discontinue metformin therapy in favor of insulin today. Pt is fearful, as she happened to suffer a miscarriage soon after initiating insulin during last pregnancy. Discussed that insulin did not cause miscarriage. Reviewed importance of good glycemic control to promote health of mother and pregnancy. Reviewed potential maternal/fetal risks of poor glycemic control with the patient today, including miscarriage, fetal anomalies, macrosomia, polyhydramnios, IUFD, delayed fetal lung maturity as well as neonatal hypoglycemia, hyperbilirubinemia and electrolyte imbalances. We also reviewed the increased risk of maternal preeclampsia in the setting of diabetes. After extensive discussion, she agrees to discontinue metformin in favor of low dose NPH therapy. Will begin NPH 20 units subcutaneous before breakfast, 10 units at bedtime. Will plan virtual prenatal visit in 2 weeks to ensure improving glycemic control. She underwent insulin teaching today with our clinic nurse.   -Will schedule consult with nutrition ASAP. Nutritional education packet shared with the pt today.   -Reviewed goals for blood glucose values including fasting blood glucose less than 95 mg/dl and now 1 hour postprandial blood glucose values less than 140 mg/dl. She agrees to begin performing glucose checks QID.  -Pt has a home glucose meter and adequate meter supplies. Reviewed the importance of good glucose control and frequent glucose checks.     -Nutritional guidelines, recommended maternal weight gain for pregnancy reviewed with patient today.  -Advised initiation of low dose aspirin therapy for preeclampsia risk-reduction. She will begin today.  -Will obtain baseline HELLP labs, urine p:c ratio today.   -Plan HgbA1c q trimester. Goal of 6.0% or less.  -Plan targeted ultrasound at 19-20 weeks, fetal echocardiogram at 22-24 weeks.  -Plan daily FM counts at 28 weeks.  -Plan serial ultrasounds to assess fetal growth every 3-4 weeks in the third trimester.  -Plan weekly antenatal testing at 32 weeks, increasing to twice weekly at 36 weeks.(Since there is no consensus for antenatal testing in pregnancy complicated by diabetes, these are [facility] recommendations. Other acceptable forms of monitoring at 32-[redacted] weeks gestation include weekly BPP or twice weekly NST).   -Anticipate delivery in the 37-39th week of pregnancy, unless a maternal/fetal indication for delivery occurs prior to that time. Ultimately, timing of delivery should be personalized based on fetal status and maternal glucose control.  -Plan referral back to PCP postpartum to continue management of type II DM.    History of cesarean section  Pregnancy history:  1. 2019: [redacted]w[redacted]d primary LTCS of 5 lb 15 oz female infant complicated by fetal intolerance of labor, maternal type II DM.  2. 2021: first trimester SAB  3. Current pregnancy    We reviewed the pros and cons of repeat cesarean delivery vs. Trial of labor after c-section. Successful vaginal delivery after cesarean delivery is increased with spontaneous onset of labor. Risk factors for unsuccessful trial of  labor, including maternal BMI, age, and prior vaginal deliveries were also discussed.     For TOLAC, maintain working IV, continuous fetal monitoring, and immediate obstetric and anesthesia availability. If these are not available, repeat cesarean delivery may be preferred.     Risks for uterine rupture and failed trial of labor are also increased with induction of labor. Often individual management may include attempt trial of labor with spontaneous labor, but repeat cesarean in the event of an indication for IOL (post term, maternal or fetal indication for delivery e.g. preeclampsia). However, Induction of labor remains an option in women undergoing TOLAC. Recommend avoidance of Misoprostol for cervical ripening. ACOG supports attempted trial of labor with one or two prior cesarean deliveries including. Women with one previous cesarean delivery with a low-transverse incision, who are otherwise appropriate candidates for twin vaginal delivery, are considered candidates for TOLAC.  Documentation of prior LTCS incision for prior cesarean deliveries is recommended and repeat cesarean would be recommended if both are not LTCS.  Women with one previous cesarean delivery with an unknown uterine scar type may be candidates for TOLAC, unless there is a high clinical suspicion of a previous classical uterine incision such as cesarean delivery performed at an extremely preterm gestation age.    History of cesarean section also increases the risk for placenta previa and accreta, thus we recommend antenatal [place] for placental location.     In summary:   After discussion, patient desires repeat c-section with BTL  Repeat c-section is planned    Reference: ACOG Practice Bulletin 205: Vaginal Birth After Cesarean Delivery, February 2019      Arcuate uterus  Mild arcuate uterus morphology noted on gyn ultrasound on 03/09/2015. Pt has no h/o preterm birth.    -Plan targeted ultrasound at 19-20 weeks with screening cervical length.    Hypothyroidism  Dx: 2008  No history of surgery or radiation.   Prepregnancy regimen: levothyroxine 125 mcg qd    Current Regimen: levothyroxine 137 mcg-->reports good compliance    We discussed today that well controlled hypothyroidism has not been shown to adversely impact fertility or pregnancy. Treatment of hypothyroidism in pregnant women is the same as for nonpregnant women and involves administering levothyroxine at sufficient dosages to normalize TSH levels.  Data indicate pregnancy increases maternal thyroid hormone requirements (23-50%) in women with hypothyroidism diagnosed before pregnancy.     If left untreated or poor control, hypothyroidism can progress to myxedema and myxedema coma.  Untreated hypothyroidism is associated with an increased risk of preeclampsia, low birth weight, preterm delivery, and stillbirth.  Well-controlled hypothyroidism does not appear to increase the risk to the fetus.     Treatment goal is TSH in normal range for each trimester.  It takes approximately 4 weeks for the thyroxine therapy to alter the TSH level. Therefore, levothyroxine therapy should be adjusted at 4-week intervals until TSH levels are stable. Plan repeat TSH Q trimester once stable medication dosing established.    Normal TSH in pregnancy  1st trimester: 0.1-2.5  2nd trimester: 0.2-3.0  3rd trimester: 0.3-3.0     Measurement of FT3 only pursued in patients with thyrotoxicosis with suppressed TSH but normal FT4 measurements    Consider growth ultrasound at 30-32 weeks; initiate antenatal testing per IUGR protocol if growth restriction present. If disease not well controlled, consider antenatal testing at 32 weeks.    Post-partum; reduce to pre-pregnancy dose or decrease dose by 25-50%.    -TSH obtained today.    BMI 37.0-37.9,  adult  Pregravid BMI: 37.62-->current BMI: 37.83    Obesity in pregnancy is associated with maternal and obstetric complications including increased risk of gestational DM, gestational HTN, preeclampsia, thromboembolism, failed induction of labor, Cesarean section, excessive gestational weight gain and post-partum weight retention. Obesity is also associated with fetal complications including prematurity, stillbirth, congenital anomalies, macrosomia with possible birth injury and childhood obesity. Additionally, obesity is associated with intra and postpartum complications including difficulty of estimating fetal weight, difficult maintaining external fetal heart tracing, increased risk of difficult and emergent Cesarean delivery, wound complications and anesthesia risk, particularly associated with difficulty intubation. Obese women are less likely to initiate and sustain breastfeeding (ACOG Practice Bulletin No. 230: Obesity in Pregnancy. Obste Gynecol 2021).     For all obese patients (BMI ? 30), we recommend the following:  Prenatal:   ??? Weight gain per Institute of Medicine 2009 guidelines: 11-20lb (5.0-9.1 kg);   ??? Consider nutrition counseling  ??? Encourage regular exercise (at least 3-4 times per week)  ??? Baseline preeclampsia labs  ??? Early glucose screening  ??? Screen for OSA (snoring, excessive daytime sleepiness, witnessed apneas, or unexplained hypoxia) and refer for sleep study if indicated  ??? If BMI ? 35, targeted ultrasound at 19-20 weeks      BMI alone is not an indication for OB anesthesia consult. Anesthesia consult is recommended if any of the following signs or symptoms are present:  1) Cannot walk 2 blocks without stopping or cannot do light housework without stopping  2) Cannot lay flat  3) Currently on anticoagulants or will be on anticoagulants     Intrapartum: sequential pneumatic compression devices    Post-partum: consider thromboprophylaxis with low molecular weight heparin, particularly in setting of Cesarean section. The [employer] currently recommends low-molecular-weight (LMW) heparin for the prevention and treatment of venous thromboembolism versus unfractionated heparin. Weight-based dosage for venous thromboembolism thromboprophylaxis may be considered rather than BMI-stratified dosage strategies in class III obese women after cesarean delivery.   In the postpartum period, continue nutrition and exercise counseling as well as recommend weight loss specialist prior to next pregnancy.    Psoriasis  Currently taking Adalimumab q 2 weeks with good response. Followed by [facility]/Rheumatology-->last visit on 08/07/20. Recommendations at that time:    Psoriasis  - Patient has previously tried and failed topical steroids  - Patient is now pregnant (did have recent miscarriage prior to starting adalimumab). She was originally prescribed ustekinumab but this was denied  - Patient has been taking Adalimumab (stopped 1 dose due to new rash). Discussed that from dermatologic perspective, we are agreeable to continuing this medication during pregnancy but defer to OBGYN. Patient plans to establish care with high risk OBGYN for her pre natal care  - recommend start clobetasol 0.05% ointment bid to affected area until flat and smooth    Tumor necrosis factor (TNF)-alpha blockers may be used in women who require these medications for the maintenance or establishment of control of active inflammatory disease during pregnancy. Some expert guidance suggests these medications should be discontinued in the late second or early third trimester. However, use of these drugs can be extended, if necessary, to a later gestational age if benefits outweigh potential risks for an individual patient.     -08/07/20: Quant gold negative, HIV: non-reactive, Hep C ab: non-reactive, Hep B surf ab: non-reactive, Hep B core ab: non-reactive  -Pt is s/p 2nd dose of Moderna COVID-19 vaccination. Considering influenza vaccination at next visit (declined  today).  -Will continue Adalimumab at this time  -Follow-up with dermatology on 11/13/20    Hepatic steatosis  CT A/P with contrast in 2017 showed likely hepatic steatosis.    -Will obtain AST, ALT today.      Anticipatory guidance reviewed. Specifically we discussed: foods safe to eat during pregnancy, including limiting fish high in mercury to once per week, heating deli meat and avoiding unpasteurized cheeses; not changing cat litter; and wearing gloves when gardening. We discussed that on average, women should eat 300 extra calories per day, and reviewed appropriate weight gaining guidelines based on the recent IOM report. We discussed how our MFM practice works, delivery at [facility] in [place], and the role of residents and students.    She will follow up in 2 weeks for a video return high risk moderate visit to review glycemic control.    This encounter was based on time. I personally spent 65 minutes face-to-face and non-face-to-face in the care of this patient, which includes all pre, intra, and post visit time on the date of service.     This patient was discussed with Dr. Lurline Hare, who is in agreement with the above recommendations.    ----------------------------------------------------------  Marshell Garfinkel, MSN, [facility]  [facility] Maternal-Fetal Medicine at [place]   [facility]  First Floor  [address] [place], [place] [address]  P  [phone] - F  [phone]    Scribe's Attestation: Marshell Garfinkel, [facility] obtained and performed the history, physical exam and medical decision making elements that were entered into the chart. Documentation assistance was provided by me personally, a scribe. Signed by Loura Back, Scribe, on September 12, 2020 at 8:04 AM.         ----------------------------------------------------------------------------------------------------------------------  September 14, 2020 2:15 PM. Documentation assistance provided by the Scribe. I was present during the time the encounter was recorded. The information recorded by the Scribe was done at my direction and has been reviewed and validated by me.  ----------------------------------------------------------------------------------------------------------------------

## 2020-09-12 NOTE — Unmapped (Signed)
Pregravid BMI: 37.62-->current BMI: 37.83    Obesity in pregnancy is associated with maternal and obstetric complications including increased risk of gestational DM, gestational HTN, preeclampsia, thromboembolism, failed induction of labor, Cesarean section, excessive gestational weight gain and post-partum weight retention. Obesity is also associated with fetal complications including prematurity, stillbirth, congenital anomalies, macrosomia with possible birth injury and childhood obesity. Additionally, obesity is associated with intra and postpartum complications including difficulty of estimating fetal weight, difficult maintaining external fetal heart tracing, increased risk of difficult and emergent Cesarean delivery, wound complications and anesthesia risk, particularly associated with difficulty intubation. Obese women are less likely to initiate and sustain breastfeeding (ACOG Practice Bulletin No. 230: Obesity in Pregnancy. Obste Gynecol 2021).     For all obese patients (BMI ? 30), we recommend the following:  Prenatal:   ??? Weight gain per Institute of Medicine 2009 guidelines: 11-20lb (5.0-9.1 kg);   ??? Consider nutrition counseling  ??? Encourage regular exercise (at least 3-4 times per week)  ??? Baseline preeclampsia labs  ??? Early glucose screening  ??? Screen for OSA (snoring, excessive daytime sleepiness, witnessed apneas, or unexplained hypoxia) and refer for sleep study if indicated  ??? If BMI ? 35, targeted ultrasound at 19-20 weeks      BMI alone is not an indication for OB anesthesia consult. Anesthesia consult is recommended if any of the following signs or symptoms are present:  1) Cannot walk 2 blocks without stopping or cannot do light housework without stopping  2) Cannot lay flat  3) Currently on anticoagulants or will be on anticoagulants     Intrapartum: sequential pneumatic compression devices    Post-partum: consider thromboprophylaxis with low molecular weight heparin, particularly in setting of Cesarean section. The [employer] currently recommends low-molecular-weight (LMW) heparin for the prevention and treatment of venous thromboembolism versus unfractionated heparin. Weight-based dosage for venous thromboembolism thromboprophylaxis may be considered rather than BMI-stratified dosage strategies in class III obese women after cesarean delivery.   In the postpartum period, continue nutrition and exercise counseling as well as recommend weight loss specialist prior to next pregnancy.

## 2020-09-12 NOTE — Unmapped (Signed)
On Humira 

## 2020-09-12 NOTE — Unmapped (Addendum)
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 Pt has no h/o preterm birth.    -Plan targeted ultrasound at 19-20 weeks with screening cervical length.

## 2020-09-12 NOTE — Unmapped (Signed)
Dx: 2008  No history of surgery or radiation.   Prepregnancy regimen: levothyroxine 125 mcg qd    Current Regimen: levothyroxine 137 mcg-->reports good compliance    We discussed today that well controlled hypothyroidism has not been shown to adversely impact fertility or pregnancy. Treatment of hypothyroidism in pregnant women is the same as for nonpregnant women and involves administering levothyroxine at sufficient dosages to normalize TSH levels.  Data indicate pregnancy increases maternal thyroid hormone requirements (23-50%) in women with hypothyroidism diagnosed before pregnancy.     If left untreated or poor control, hypothyroidism can progress to myxedema and myxedema coma.  Untreated hypothyroidism is associated with an increased risk of preeclampsia, low birth weight, preterm delivery, and stillbirth.  Well-controlled hypothyroidism does not appear to increase the risk to the fetus.     Treatment goal is TSH in normal range for each trimester.  It takes approximately 4 weeks for the thyroxine therapy to alter the TSH level. Therefore, levothyroxine therapy should be adjusted at 4-week intervals until TSH levels are stable. Plan repeat TSH Q trimester once stable medication dosing established.    Normal TSH in pregnancy  1st trimester: 0.1-2.5  2nd trimester: 0.2-3.0  3rd trimester: 0.3-3.0     Measurement of FT3 only pursued in patients with thyrotoxicosis with suppressed TSH but normal FT4 measurements    Consider growth ultrasound at 30-32 weeks; initiate antenatal testing per IUGR protocol if growth restriction present. If disease not well controlled, consider antenatal testing at 32 weeks.    Post-partum; reduce to pre-pregnancy dose or decrease dose by 25-50%.    -TSH obtained today.

## 2020-09-12 NOTE — Unmapped (Addendum)
Pt will be 39 yo at EDD.    We discussed the patient's age related risk of aneuploidy.  We discussed screening options including serum screening and NIPT as well as diagnostic options of CVS and amniocentesis.  We discussed risks, benefits and diagnostic capability of both CVS and amniocentesis.  She expressed interest in cfDNA.    -Patient desires cfDNA after counseling today-->drawn on 09/12/20, results pending (planning gender reveal, send Epic message only with gender)  -Plan targeted ultrasound at 19-20 weeks

## 2020-09-12 NOTE — Unmapped (Addendum)
-  Currently on Humira. Started earlier this year

## 2020-09-13 DIAGNOSIS — O09529 Supervision of elderly multigravida, unspecified trimester: Principal | ICD-10-CM

## 2020-09-13 LAB — SYPHILIS SCREEN: SYPHILIS AB IGG/IGM SCREEN: NEGATIVE

## 2020-09-13 LAB — HEMOGLOBIN A1C
ESTIMATED AVERAGE GLUCOSE: 131 mg/dL
HEMOGLOBIN A1C: 6.2 % — ABNORMAL HIGH (ref 4.8–5.6)

## 2020-09-13 LAB — VARICELLA ZOSTER ANTIBODY, IGG: VARICELLA ZOSTER IGG: POSITIVE

## 2020-09-13 NOTE — Unmapped (Addendum)
-----   Message from [place], [facility] sent at 09/13/2020 12:49 PM EST -----  Regarding: FW: Testing cancelled  Hi ladies,    Please let her know the lab was unable to run her cfDNA test (I believe she was a difficult stick yesterday). I've re-ordered the test. She is welcome to go to the lab in [facility] (closer to her) for the redraw at a time convenient for her.    Thank you!    Arline Asp  ----- Message -----  From: Lance Bosch  Sent: 09/13/2020   9:49 AM EST  To: Burnard Bunting, [facility]  Subject: Testing cancelled                                Hi Aram Beecham,    This is just an FYI, the sample that was submitted to [place] on this patient for [facility] N[facility] was a short sample.  Neither tube met the minimum requirements for [place] to process.    Feel free to contact us with any questions.      [facility]  Referral Testing Services      Spoke with pt in response to the above message. Informed pt that she needs to have her genetics lab redrawn due to the one that was drawn yesterday did not have enough blood. Informed pt that she can go to the hospital in [facility] [place], if it's closer to her to have it redrawn if she would like. Informed pt that the order has already been placed and she needed to have a photo ID. Pt verbalized understanding and did not have any other questions.

## 2020-09-14 ENCOUNTER — Other Ambulatory Visit: Admit: 2020-09-14 | Discharge: 2020-09-15 | Payer: PRIVATE HEALTH INSURANCE

## 2020-09-14 DIAGNOSIS — O09529 Supervision of elderly multigravida, unspecified trimester: Principal | ICD-10-CM

## 2020-09-14 NOTE — Unmapped (Signed)
CT A/P with contrast in 2017 showed likely hepatic steatosis.    -Will obtain AST, ALT today.

## 2020-09-26 ENCOUNTER — Encounter
Admit: 2020-09-26 | Discharge: 2020-09-27 | Payer: PRIVATE HEALTH INSURANCE | Attending: "Obstetric | Primary: "Obstetric

## 2020-09-26 DIAGNOSIS — E039 Hypothyroidism, unspecified: Principal | ICD-10-CM

## 2020-09-26 DIAGNOSIS — O09529 Supervision of elderly multigravida, unspecified trimester: Principal | ICD-10-CM

## 2020-09-26 DIAGNOSIS — L409 Psoriasis, unspecified: Principal | ICD-10-CM

## 2020-09-26 DIAGNOSIS — O0990 Supervision of high risk pregnancy, unspecified, unspecified trimester: Principal | ICD-10-CM

## 2020-09-26 DIAGNOSIS — Z6837 Body mass index (BMI) 37.0-37.9, adult: Principal | ICD-10-CM

## 2020-09-26 DIAGNOSIS — Z98891 History of uterine scar from previous surgery: Principal | ICD-10-CM

## 2020-09-26 DIAGNOSIS — Q51810 Arcuate uterus: Principal | ICD-10-CM

## 2020-09-26 NOTE — Unmapped (Addendum)
History of type II DM:  - Diagnosis: type II DM, diagnosed in 2015 (also has h/o PCOS)  - PCP: Leonette Most Drew/[facility]  - Current regimen: Metformin 1000 mg BID  - Recent HgbA1c: 6.2%  - H/o DKA: Denies  - Glucagon kit: Rx given  - Medic alert bracelet/necklace: encouraged  - Foot exam: encouraged at least monthly  - Baseline retinal eye exam: recommended if not obtained within last 6 months-->referral placed, appt pending  - Baseline EKG: recommended if not obtained within last 6 months-->order placed and given to pt   - H/o retinopathy: Denies  - H/o neuropathy: Denies  - H/o nephropathy: Denies  - H/o hypoglycemic unawareness: denies    We reviewed her glucose log today, values as follow:    Time   Out of range   Total   Range     Fasting   11 11 101-134   Breakfast 1 5 120-143   Lunch  0 0 0   Dinner  0 0 0     -All FBS are elevated and most postprandials are normal  -Patient reports that she gets up with her husband at 0530, has a glass of milk and goes back to sleep  -She is checking her fasting blood sugar at 9 am when she wakes up again   -So her fasting blood sugars are not truly fasting   -Her breakfast value is usually around 10 am  -She eats at 2-3 pm before she goes to work at 4 pm and this is usually only fruit and vegetables  -She has a break at work, but usually just has a glass of milk  -I encouraged her to include protein with meals/snacks and to try and check her blood glucose after lunch   -I also encouraged her to check her blood sugar at 0530 before she has milk to try to get a true FBS  -Discussed options including protein bars/shakes, nuts, cheese, peanut butter  -Current regimen: NPH 20 units subcutaneous before breakfast, 10 units at bedtime.   -I recommended that she increase her insulin and she agrees with this plan  -Her new regimen is NPH 20/12  -Dietary compliance was encouraged  -Nutrition consult scheduled

## 2020-09-26 NOTE — Unmapped (Signed)
On Humira 

## 2020-09-26 NOTE — Unmapped (Signed)
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 Pt has no h/o preterm birth.    -Plan targeted ultrasound at 19-20 weeks with screening cervical length.

## 2020-09-26 NOTE — Unmapped (Addendum)
-  Overview reviewed and updated.  -No obstetric complaint today

## 2020-09-26 NOTE — Unmapped (Signed)
Pregravid BMI: 37.62-->current BMI: 37.83     For all obese patients (BMI ? 30), we recommend the following:  Prenatal:   ??? Weight gain per Institute of Medicine 2009 guidelines: 11-20lb (5.0-9.1 kg);   ??? Consider nutrition counseling  ??? Encourage regular exercise (at least 3-4 times per week)  ??? Baseline preeclampsia labs  ??? Early glucose screening  ??? Screen for OSA (snoring, excessive daytime sleepiness, witnessed apneas, or unexplained hypoxia) and refer for sleep study if indicated  ??? If BMI ? 35, targeted ultrasound at 19-20 weeks      BMI alone is not an indication for OB anesthesia consult. Anesthesia consult is recommended if any of the following signs or symptoms are present:  1) Cannot walk 2 blocks without stopping or cannot do light housework without stopping  2) Cannot lay flat  3) Currently on anticoagulants or will be on anticoagulants     Intrapartum: sequential pneumatic compression devices    Post-partum: consider thromboprophylaxis with low molecular weight heparin, particularly in setting of Cesarean section. The [employer] currently recommends low-molecular-weight (LMW) heparin for the prevention and treatment of venous thromboembolism versus unfractionated heparin. Weight-based dosage for venous thromboembolism thromboprophylaxis may be considered rather than BMI-stratified dosage strategies in class III obese women after cesarean delivery.   In the postpartum period, continue nutrition and exercise counseling as well as recommend weight loss specialist prior to next pregnancy.

## 2020-09-26 NOTE — Unmapped (Signed)
Provider: Timmie Foerster, [facility]   Division: Maternal-Fetal Medicine    Return Prenatal Note          I spent 18 minutes on the real-time audio and video with the patient on the date of service. I spent an additional 10 minutes on pre- and post-visit activities on the date of service.     The patient was physically located in [place] or a state in which I am permitted to provide care. The patient and/or parent/guardian understood that s/he may incur co-pays and cost sharing, and agreed to the telemedicine visit. The visit was reasonable and appropriate under the circumstances given the patient's presentation at the time.    The patient and/or parent/guardian has been advised of the potential risks and limitations of this mode of treatment (including, but not limited to, the absence of in-person examination) and has agreed to be treated using telemedicine. The patient's/patient's family's questions regarding telemedicine have been answered.     If the visit was completed in an ambulatory setting, the patient and/or parent/guardian has also been advised to contact their provider???s office for worsening conditions, and seek emergency medical treatment and/or call 911 if the patient deems either necessary.         Assessment/Plan:     Plan   39 y.o. G3P1011 at [redacted]w[redacted]d presents for a follow-up OB visit for high risk pregnancy.     Supervision of high risk pregnancy, antepartum  -Overview reviewed and updated.  -No obstetric complaint today    Antepartum multigravida of advanced maternal age  -Pt will be 39 yo at EDD  -cfDNA negative (planning gender reveal, send Epic message only with gender)  -Plan targeted ultrasound at 19-20 weeks     Type 2 diabetes mellitus (CMS-HCC)  History of type II DM:  - Diagnosis: type II DM, diagnosed in 2015 (also has h/o PCOS)  - PCP: Leonette Most Drew/[facility]  - Current regimen: Metformin 1000 mg BID  - Recent HgbA1c: 6.2%  - H/o DKA: Denies  - Glucagon kit: Rx given  - Medic alert bracelet/necklace: encouraged  - Foot exam: encouraged at least monthly  - Baseline retinal eye exam: recommended if not obtained within last 6 months-->referral placed, appt pending  - Baseline EKG: recommended if not obtained within last 6 months-->order placed and given to pt   - H/o retinopathy: Denies  - H/o neuropathy: Denies  - H/o nephropathy: Denies  - H/o hypoglycemic unawareness: denies    We reviewed her glucose log today, values as follow:    Time   Out of range   Total   Range     Fasting   11 11 101-134   Breakfast 1 5 120-143   Lunch  0 0 0   Dinner  0 0 0     -All FBS are elevated and most postprandials are normal  -Patient reports that she gets up with her husband at 0530, has a glass of milk and goes back to sleep  -She is checking her fasting blood sugar at 9 am when she wakes up again   -So her fasting blood sugars are not truly fasting   -Her breakfast value is usually around 10 am  -She eats at 2-3 pm before she goes to work at 4 pm and this is usually only fruit and vegetables  -She has a break at work, but usually just has a glass of milk  -I encouraged her to include protein with meals/snacks and to try  and check her blood glucose after lunch   -I also encouraged her to check her blood sugar at 0530 before she has milk to try to get a true FBS  -Discussed options including protein bars/shakes, nuts, cheese, peanut butter  -Current regimen: NPH 20 units subcutaneous before breakfast, 10 units at bedtime.   -I recommended that she increase her insulin and she agrees with this plan  -Her new regimen is NPH 20/12  -Dietary compliance was encouraged  -Nutrition consult scheduled                  Subjective     39 y.o. G3P1011 at [redacted]w[redacted]d presents for this follow-up OB visit for high risk pregnancy. Patient denies contractions, leakage of fluid, or vaginal bleeding.    Objective:     The following portions of the patient's history were reviewed and updated as appropriate: allergies, current medications, past obstetric history, past medical history and problem list    Edinburgh Total Score: (not recorded)      Flowsheet Vitals:     Total weight gain: 0.544 kg (1 lb 3.2 oz)    General Appearance  No acute distress, well appearing, and well nourished  Pulmonary   Normal work of breathing  Cardiovascular  Normal rate  Neurologic   Alert and oriented to person, place, and time  Psychiatric   Mood and affect within normal limits      Medical Decision Making:     This encounter was billed based on medical decision making. Number and complexity of problems addressed: Moderate: 2 or more stable chronic illnesses, amount and/or complexity of data to be reviewed and analyzed: Review of prior external note(s) from each unique source: Progress notes  Review of the result(s) of each unique test: Glucose log, risk of complications and/or morbidity or mortality of patient management: Moderate: Prescription drug management: Insulin, MDM LOS: moderate.      ______________________________  Orie Rout. Loleta Chance, MSN, [facility]  Nurse Practitioner  Division of Maternal-Fetal Medicine  Department of Obstetrics & Gynecology  [facility]

## 2020-09-26 NOTE — Unmapped (Addendum)
-  Pt will be 39 yo at EDD  -cfDNA negative (planning gender reveal, send Epic message only with gender)  -Plan targeted ultrasound at 19-20 weeks

## 2020-09-26 NOTE — Unmapped (Signed)
Pregnancy history:  1. 2019: [redacted]w[redacted]d primary LTCS of 5 lb 15 oz female infant complicated by fetal intolerance of labor, maternal type II DM.  2. 2021: first trimester SAB  3. Current pregnancy    -After discussion, patient desires repeat c-section with BTL  Repeat c-section is planned

## 2020-09-26 NOTE — Unmapped (Addendum)
Dx: 2008  No history of surgery or radiation.   Prepregnancy regimen: levothyroxine 125 mcg qd    Current Regimen: levothyroxine 137 mcg-->reports good compliance    Consider growth ultrasound at 30-32 weeks; initiate antenatal testing per IUGR protocol if growth restriction present. If disease not well controlled, consider antenatal testing at 32 weeks.    Post-partum; reduce to pre-pregnancy dose or decrease dose by 25-50%.    -TSH (11/17): 1.702

## 2020-09-28 ENCOUNTER — Encounter
Admit: 2020-09-28 | Discharge: 2020-09-29 | Payer: PRIVATE HEALTH INSURANCE | Attending: Registered" | Primary: Registered"

## 2020-09-28 DIAGNOSIS — E1100 Type 2 diabetes mellitus with hyperosmolarity without nonketotic hyperglycemic-hyperosmolar coma (NKHHC): Principal | ICD-10-CM

## 2020-09-28 DIAGNOSIS — O0992 Supervision of high risk pregnancy, unspecified, second trimester: Principal | ICD-10-CM

## 2020-09-28 NOTE — Unmapped (Signed)
[facility] Outpatient Nutrition Services   Medical Nutrition Therapy Consultation     Visit Type:    Initial Assessment    Referral Reason:   Type 2 Diabetes, currently pregnant     Stephanie Hester is a 39 y.o. G3P1011 at [redacted]w[redacted]d who presented for medical nutrition therapy.  Estimated Date of Delivery: None noted.    Her active problem list, medication list and lab results were reviewed.     Patient Active Problem List   Diagnosis   ??? Type 2 diabetes mellitus (CMS-HCC)   ??? Arcuate uterus   ??? Hypothyroidism   ??? Supervision of high risk pregnancy, antepartum   ??? BMI 37.0-37.9, adult   ??? History of cesarean section   ??? Antepartum multigravida of advanced maternal age   ??? Psoriasis   ??? Hepatic steatosis       Anthropometrics   Pre-pregnancy weight: 200 lb  Pre-pregnancy body mass index: 37.9 kg/m??    Current body weight:    Wt Readings from Last 5 Encounters:   09/12/20 90.8 kg (200 lb 3.2 oz)   08/07/20 90.8 kg (200 lb 3.2 oz)   05/01/20 90.5 kg (199 lb 9.6 oz)   04/20/20 90.8 kg (200 lb 3.2 oz)   04/21/18 86.3 kg (190 lb 4.8 oz)        IOM target weight gain: 11-20 lb    Total weight gain during pregnancy: 0 lb    Nutrition Risk Screening:     Nutrition Focused Physical Exam:    Unable to assess/virtual visit    Malnutrition Screening:     Unable to assess/virtual visit    Biochemical Data, Medical Tests and Procedures:  All pertinent labs and imaging reviewed    Lab Results   Component Value Date    A1C 6.2 (H) 09/12/2020    A1C 5.4 12/17/2017    GLU 98 08/10/2017     No results found for requested labs within last 360 days.       Self-Monitoring Blood Glucose:   per NP note 12/1  Time ?? Out of range ?? Total ?? Range mg/dL   Fasting ?? 11 11 161-096   Breakfast 1 5 120-143   Lunch?? 0 0 0   Dinner?? 0 0 0   Yesterday fasting 112, breakfast 130, did not check after lunch or dinner  Today fasting 109    Medications and Vitamin/Mineral Supplementation:   All nutritionally pertinent medications reviewed on today  She is taking prenatal multivitamin that contains iron  Current Outpatient Medications   Medication Sig Dispense Refill   ??? blood-glucose meter (FREESTYLE INSULINX) Misc Test fasting and one hour after each meal.  Any brand ok. 1 each 0   ??? clobetasoL (TEMOVATE) 0.05 % external solution Apply topically daily to scalp 50 mL 5   ??? clobetasoL (TEMOVATE) 0.05 % ointment Apply twice daily to red, raised rash for 2 weeks 60 g 2   ??? empty container Misc Use as directed to dispose of used Humira pens. 1 each 2   ??? glucagon (GLUCAGON) 1 mg SolR Glucagon emergency kit: use only as directed. 1 each 1   ??? halobetasol (ULTRAVATE) 0.05 % ointment Apply topically Two (2) times a day. To psoriasis on the body. 100 g 2   ??? HUMIRA PEN CITRATE FREE 40 MG/0.4 ML Inject the contents of 1 pen (40 mg total) under the skin every fourteen (14) days. 2 each 11   ??? insulin NPH (HUMULIN N NPH  U-100 INSULIN) 100 unit/mL injection Humulin or Novolin NPH vial (brand substitution allowed per insurance preference): take 20 units before breakfast, 10 units at bedtime. 10 mL 3   ??? insulin syringe-needle U-100 1 mL 31 x 3/8 (10 mm) Syrg Use with insulin as directed twice daily as directed. 100 each 3   ??? levothyroxine (SYNTHROID) 125 MCG tablet Take 137 mcg by mouth daily.      ??? multivitamin, prenatal, folic acid-iron, 27-1 mg Tab Take 1 tablet by mouth daily. 100 tablet 3     No current facility-administered medications for this visit.         Nutrition History: Pt speaks English but prefers to receive educational materials in [place].    Dietary Restrictions: No known food allergies or food intolerances.     Gastrointestinal Issues: Denied issues    Hunger and Satiety: Denied issues.     Food Safety and Access: No to little issues noted.  She has Radiographer, therapeutic Nutrition Program with [facility].    Diet Recall:   Time Intake   Breakfast 5/5:30 am- 1/2 glass of milk.   Goes back to sleep and wakes up again around 9/9:30 am.  10/11 am- Bread, eggs, water or milk   Snack (AM) Fruit (pears, apples, or orange)  Then 1 hour later, eats a banana or another fruit or trail mix (almonds, cranberries, pumpkin seeds)   Lunch 4/4:30 pm- soup (chicken, vegetables), 2-3 tortillas,   Sometimes beans with cheese   Snack (PM) none   Dinner Milk, 1/2 apple or almonds or pecans   Snack (HS) none     Food-Related History: Normally eats 2 times/day. Eating more fruits and vegetables, drinking a lot of water.   Beverages:  water  Dining Out:  Once/week  Meal Preparation: self  Cravings: none    Physical Activity:  walks outside with baby, daily 30-40 minutes     Estimated Nutritional Needs:  Iron: 27 mg/day  Calcium: 1000 mg/day  Protein: 71-105 g/day   Fluid: 3 L/day   Daily Carbohydrate Pattern:   -Breakfast: 2 servings   -Snack: 1 serving   -Lunch 3 servings   -Snack 1-2 servings  -Dinner 3 servings  -Snack 1-2 servings       Nutrition Goals & Evaluation      Meet estimated nutrient needs on a healthy balanced diet (New)  Achieve recommended weight gain for pregnancy per IOM Guidelines (New)  Meet physical activity goals for pregnancy (New)    Nutrition goals reviewed, and relevant barriers identified and addressed: none evident. She is evaluated to have good willingness and ability to achieve nutrition goals.     Nutrition Assessment       She presents with food and nutrition-related knowledge deficit related to dietary lifestyle as evidenced by patient report of not knowing how to modify dietary practices and behaviors to achieve meeting nutrient needs during pregnancy.   Nutrition assessment indicates that she is not meeting her nutrient needs as evidenced by food and nutrient history, and nutrition supplement use.   Total weight gain is within target for gestational age.     Postprandial BG levels mostly within goal but fasting levels have all been elevated. Has not been checking after lunch and dinner.     Nutrition Intervention      - Nutrition Education: carbohydrate consistent diet/eating style. Education resources provided include: handouts promoting nutrition intervention     Nutrition Plan:   Check fasting BG first thing in the morning  when you first wake up. Check 1 hour after breakfast, lunch, and dinner.   Spread food intake into 3 small to moderate meals and 3 snacks  Add 30-45 grams carbohydrate per meal and 15 grams per snack  Add protein and carbohydrates to all meals and snacks.     Materials Provided: Diabetes en el embarazo , carbohydrate consistent meal and snack ideas (sent via MyChart)    Follow-up: Return in about 4 weeks (around 10/26/2020).     Food/Nutrition-related history, Anthropometric measurements, Biochemical data, medical tests, procedures and Effectiveness of nutrition interventions will be assessed at time of follow-up.       Recommendations for Referring Provider :  Patient to continue with medical nutrition therapy in about 1 month. Please refer back sooner if warranted.         Pt was unable to connect via video after several attempts.   I spent 24 minutes on the phone with the patient on the date of service. I spent an additional 15 minutes on pre- and post-visit activities on the date of service.     The patient was physically located in [place] or a state in which I am permitted to provide care. The patient and/or parent/guardian understood that s/he may incur co-pays and cost sharing, and agreed to the telemedicine visit. The visit was reasonable and appropriate under the circumstances given the patient's presentation at the time.    The patient and/or parent/guardian has been advised of the potential risks and limitations of this mode of treatment (including, but not limited to, the absence of in-person examination) and has agreed to be treated using telemedicine. The patient's/patient's family's questions regarding telemedicine have been answered.     If the visit was completed in an ambulatory setting, the patient and/or parent/guardian has also been advised to contact their provider???s office for worsening conditions, and seek emergency medical treatment and/or call 911 if the patient deems either necessary.  I am located off-site and the patient is located off-site for this visit.

## 2020-10-01 NOTE — Unmapped (Signed)
[facility] Refill Coordination Note    Specialty Medication(s) to be Shipped:   Inflammatory Disorders: Humira    Other medication(s) to be shipped: No additional medications requested for fill at this time     Stephanie Hester, DOB: 1981-06-19  Phone: [phone] (home)       All above HIPAA information was verified with patient.     Was a Nurse, learning disability used for this call? No    Completed refill call assessment today to schedule patient's medication shipment from the [facility] [phone]).       Specialty medication(s) and dose(s) confirmed: Regimen is correct and unchanged.   Changes to medications: Riane reports no changes at this time.  Changes to insurance: No  Questions for the pharmacist: No    Confirmed patient received Welcome Packet with first shipment. The patient will receive a drug information handout for each medication shipped and additional FDA Medication Guides as required.       DISEASE/MEDICATION-SPECIFIC INFORMATION        For patients on injectable medications: Patient currently has 0 doses left.  Next injection is scheduled for 10/12/2020.    SPECIALTY MEDICATION ADHERENCE     Medication Adherence    Patient reported X missed doses in the last month: 0  Specialty Medication: Humira Cf 40 mg/0.4 ml   Patient is on additional specialty medications: No  Any gaps in refill history greater than 2 weeks in the last 3 months: no  Demonstrates understanding of importance of adherence: yes  Informant: patient  Reliability of informant: reliable  Confirmed plan for next specialty medication refill: delivery by pharmacy  Refills needed for supportive medications: not needed                      SHIPPING     Shipping address confirmed in Epic.     Delivery Scheduled: Yes, Expected medication delivery date: 10/08/2020.     Medication will be delivered via Same Day Courier to the prescription address in Epic WAM.    Elese Rane D Luie Laneve   [facility] Specialty Technician

## 2020-10-08 DIAGNOSIS — L409 Psoriasis, unspecified: Principal | ICD-10-CM

## 2020-10-08 MED FILL — HUMIRA PEN CITRATE FREE 40 MG/0.4 ML: SUBCUTANEOUS | 28 days supply | Qty: 2 | Fill #4

## 2020-10-08 MED FILL — HUMIRA PEN CITRATE FREE 40 MG/0.4 ML: 28 days supply | Qty: 2 | Fill #4 | Status: AC

## 2020-10-10 ENCOUNTER — Encounter: Admit: 2020-10-10 | Discharge: 2020-10-11 | Payer: PRIVATE HEALTH INSURANCE

## 2020-10-10 DIAGNOSIS — O09529 Supervision of elderly multigravida, unspecified trimester: Principal | ICD-10-CM

## 2020-10-10 DIAGNOSIS — O0990 Supervision of high risk pregnancy, unspecified, unspecified trimester: Principal | ICD-10-CM

## 2020-10-10 DIAGNOSIS — L409 Psoriasis, unspecified: Principal | ICD-10-CM

## 2020-10-10 DIAGNOSIS — E039 Hypothyroidism, unspecified: Principal | ICD-10-CM

## 2020-10-10 DIAGNOSIS — K760 Fatty (change of) liver, not elsewhere classified: Principal | ICD-10-CM

## 2020-10-10 NOTE — Unmapped (Addendum)
Currently on Levothyroxine 137 mcg daily.

## 2020-10-10 NOTE — Unmapped (Signed)
Currently on Humira q 2 weeks. Reports she does feel somewhat dizzy for a couple hours after she receives her Humira. She has messaged her Rheumatologist. Precautions reviewed with patient.   Next Rheumatology visit scheduled 1/18.

## 2020-10-10 NOTE — Unmapped (Signed)
Last LFTs stable. Denies any symptoms.

## 2020-10-10 NOTE — Unmapped (Signed)
Blood Glucose Log:     F:10/10      98-122  B:4/7      130-195  L:  D:7/8      138-150

## 2020-10-11 DIAGNOSIS — Z01812 Encounter for preprocedural laboratory examination: Principal | ICD-10-CM

## 2020-10-11 NOTE — Unmapped (Signed)
[name]                                                                         Return Prenatal Note    Assessment/Plan:   Plan   39 y.o. G3P1011 at [redacted]w[redacted]d presents for a follow-up OB visit for high risk pregnancy.     Hepatic steatosis  Last LFTs stable. Denies any symptoms.     Hypothyroidism  Currently on Levothyroxine 137 mcg daily.     Psoriasis  Currently on Humira q 2 weeks. Reports she does feel somewhat dizzy for a couple hours after she receives her Humira. She has messaged her Rheumatologist. Precautions reviewed with patient.   Next Rheumatology visit scheduled 1/18.    Antepartum multigravida of advanced maternal age  S/p cfDNA. Does not want to know gender at this time.     Supervision of high risk pregnancy, antepartum  Denies any obstetric concerns.  Denies VB, LOF or uterine cramping. Precautions reviewed.   BSUS ultrasound performed; unable to obtain FHTs. Added patient on for viability ultrasound. A non-viable pregnancy was confirmed; cardiac activity absent, CRL 45.2 mm  Discussed with patient and her husband ultrasound findings, briefly reviewed options with patient and advised patient that Gyn team will be contacting her to discuss further management. Patient and her husband deny and further questions or concerns.  Current T&S on file                Subjective:         39 y.o. G3P1011 at [redacted]w[redacted]d presents for follow-up OB visit for high risk pregnancy. Patient denies contractions, leakage of fluid, or vaginal bleeding.      Objective:        The following portions of the patient's history were reviewed and updated as appropriate: current medications and problem list    Flowsheet Vitals:  Temp: 37 ??C (98.6 ??F)  Heart Rate: 85  BP: 97/70  Loss of Fluid: No  Bleeding: No  Contractions: Not present  Total weight gain: 2.268 kg (5 lb)    General Appearance No acute distress, well appearing, and well nourished. Pulmonary Normal work of breathing.   Cardiovascular Normal rate.  Edema none.      Gastronintestinal Abdomen soft, gravid, no rebound or guarding.   Genitourinary SVE:  deferred   Muskuloskeletal Normal gait, grossly normal range of motion   Neurologic Alert and oriented to person, place, and time   Psychiatric  Integumentary Mood and affect within normal limits  Skin is warm, dry, no rash noted   This encounter was coded based on time. I personally spent 35 minutes face-to-face and non-face-to-face in the care of this patient, which includes all pre, intra, and post visit time on the date of service.      [name], [facility]

## 2020-10-11 NOTE — Unmapped (Signed)
S/p cfDNA. Does not want to know gender at this time.

## 2020-10-11 NOTE — Unmapped (Addendum)
Denies any obstetric concerns.  Denies VB, LOF or uterine cramping. Precautions reviewed.   BSUS ultrasound performed; unable to obtain FHTs. Added patient on for viability ultrasound. A non-viable pregnancy was confirmed; cardiac activity absent, CRL 45.2 mm  Discussed with patient and her husband ultrasound findings, briefly reviewed options with patient and advised patient that Gyn team will be contacting her to discuss further management. Patient and her husband deny and further questions or concerns.  Current T&S on file

## 2020-10-12 DIAGNOSIS — Z794 Long term (current) use of insulin: Principal | ICD-10-CM

## 2020-10-12 DIAGNOSIS — Z7982 Long term (current) use of aspirin: Principal | ICD-10-CM

## 2020-10-12 DIAGNOSIS — E119 Type 2 diabetes mellitus without complications: Principal | ICD-10-CM

## 2020-10-12 DIAGNOSIS — O021 Missed abortion: Principal | ICD-10-CM

## 2020-10-12 DIAGNOSIS — Z79890 Hormone replacement therapy (postmenopausal): Principal | ICD-10-CM

## 2020-10-12 DIAGNOSIS — E785 Hyperlipidemia, unspecified: Principal | ICD-10-CM

## 2020-10-12 DIAGNOSIS — E079 Disorder of thyroid, unspecified: Principal | ICD-10-CM

## 2020-10-12 NOTE — Unmapped (Addendum)
10/11/20  Called pt to discuss management options.   Discussed at [redacted]w[redacted]d GA could consider medication vs surgical but with medication may have significant heavy bleeding/cramps at home. She had 2 weeks of heavy bleeding after her last miscarriage and would like to avoid this.  Reviewed office EVA vs OR EVA and desires in OR.   Briefly reviewed B/R to both and she will have a preop appt.  Reviewed PMH, PSH, Meds, Allergies.   All questions answered.  Had normal cfDNA - but ask re: karyotype and microarray   Also given > 10wks in setting of chromosomally normal fetus can consider APAS testing     10/12/20  Called pt to clarify. COVID test for Sat, preop Mond, procedure on Tuesday. Again reviewed her options for medications if she wanted mgmt over the weekend. She doesn't want medications. She is upset that likely had a fetal demise 3 weeks ago and is now having to wait until Tuesday. Shared that I'm sorry that she is going through this loss but unfortunately scheduled procedures don't occur over the weekend. Reviewed ED precautions. All questions answered

## 2020-10-13 ENCOUNTER — Ambulatory Visit
Admit: 2020-10-13 | Discharge: 2020-10-14 | Payer: PRIVATE HEALTH INSURANCE | Attending: Physician Assistant | Primary: Physician Assistant

## 2020-10-13 DIAGNOSIS — Z01812 Encounter for preprocedural laboratory examination: Principal | ICD-10-CM

## 2020-10-13 NOTE — Unmapped (Signed)
COVID Pre-Procedure Intake Form     Assessment     Stephanie Hester is a 39 y.o. female presenting to [facility] for COVID testing.     Plan     If no testing performed, pt counseled on routine care for respiratory illness.  If testing performed, COVID sent.  Patient directed to Home given findings during today's visit.    Subjective     Stephanie Hester is a 39 y.o. female who [facility] with complaints of the following:    Exposure History: In the last 21 days?     Have you traveled outside of [place]? No               Have you been in close contact with someone confirmed by a test to have COVID? (Close contact is within 6 feet for at least 10 minutes) No       Have you worked in a health care facility? No     Lived or worked facility like a nursing home, group home, or assisted living?    No         Are you scheduled to have surgery or a procedure in the next 3 days? Yes               Are you scheduled to receive cancer chemotherapy within the next 7 days?    No     Have you ever been tested before for COVID-19 with a swab of your nose? No   Are you a healthcare worker being tested so to return to work No     Right now,  do you have any of the following that developed over the past 7 days (as stated by patient on intake form):    Subjective fever (felt feverish) No   Chills (especially repeated shaking chills) No   Severe fatigue (felt very tired) No   Muscle aches No   Runny nose No   Sore throat No   Loss of taste or smell No   Cough (new onset or worsening of chronic cough) No   Shortness of breath No   Nausea or vomiting No   Headache No   Abdominal Pain No   Diarrhea (3 or more loose stools in last 24 hours) No       Scribe's Attestation: Stephanie Hester, Stephanie Hester obtained and performed the history, physical exam and medical decision making elements that were entered into the chart.  Signed by Georgette Shell serving as Scribe, on 10/13/2020 11:35 AM    The documentation recorded by the scribe accurately reflects the service I personally performed and the decisions made by me. Yarrow Linhart F. Latanya Maudlin  October 13, 2020 11:36 AM
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
## 2020-10-15 NOTE — Unmapped (Addendum)
[facility]    C??mo comunicarse con nosotros:  1.   Durante el horario de oficina (8 a. m. ??? 4 p. m.): [phone]  2.   En otros horarios, incluso fines de [place], feriados, noches y [place], llame a la operadora de [facility] al [phone] y pregunte por ??GYN Consult Pager??  3.   Para hacer una cita: [phone]    Ll??menos inmediatamente si tiene:  ???   FIEBRE: si su temperatura es de 100.4??F o mayor o si tiene temblores por los escalofr??os.  ???  SANGRADO ABUNDANTE: si [employer] o m??s toallas femeninas maxi en una hora o menos; si tiene co??gulos grandes; o si se siente d??bil, mareada o a punto de desmayarse.  ??? C??LICOS MENTRUALES INTENSOS: si tiene c??licos menstruales intensos o dolores abdominales que no se calmen con Ibuprofeno.  ??? SECRECI??N O FLUJO: si tiene una secreci??n vaginal con mal olor diferente a la del sangrado.        Qu?? esperar despu??s del procedimiento:    ???   No es seguro que maneje o tome decisiones importantes 24 horas despu??s de la cirug??a.    ???   Estar?? lista para volver al Aleen Campi o a la escuela dos d??as despu??s del procedimiento.  Av??senos si necesita una nota del m??dico.    ??? Durante las [address], [address], tome mucho l??quido y no levante objetos pesados o realice actividades agotadoras hasta que est?? lista.    ??? Le dar??n el antibi??tico Doxicilina (Doxycycline) ??? dos tabletas. Tome una tableta con las pr??ximas dos comidas; puede esperar a tomarlos hasta que haya comido [place], de otro modo sentir?? n??useas. Es MUY IMPORTANTE que tome estos antibi??ticos.    ??? Tristeza. Ll??menos por favor si tiene dificultad para lidiar con los sentimientos de tristeza. Le proporcionaremos m??s asistencia. A algunas mujeres les es de gran ayuda la l??nea gratuita: [phone]).    Sangrado o manchas de sangre: es normal tenerlos [employer] 2 semanas. Use toallas femeninas maxi para notar cu??nto sangrado tiene. Algunas mujeres no presentan ning??n sangrado, lo cual es tambi??n normal. Si tiene sangrado muy abundante (moja m??s de una toalla femenina por hora en un per??odo de dos horas  o  m??s) o si tiene co??gulos grandes, ll??menos de inmediato. Si despu??s de [employer] del procedimiento todav??a tiene sangrado, y no s??lo manchas de [place], ll??menos. Necesitar?? ver a uno de nuestros m??dicos para que la examine.    Los dolores y c??licos menstruales son normales durante los primeros dos d??as despu??s del procedimiento. Puede usar ibuprofeno, Tylenol o una almohadilla el??ctrica para Acupuncturist.    Revise su temperatura todos los d??as [employer]. Si su temperatura es de 100.4oF o mayor o si se siente mal, ll??menos de inmediato.    No coloque nada en la vagina por [employer]. El cuello del ??tero puede permanecer abierto por [employer]. No tenga relaciones sexuales ni use tampones.    Puede anticipar la primera menstruaci??n dentro de dos a seis semanas si no [place] ning??n m??todo anticonceptivo despu??s del procedimiento.  Si comienza a Buyer, retail, puede anticipar lo siguiente:  ???    Anticonceptivo intrauterino LNG (Mirena??) colocado despu??s del procedimiento:  anticipe sangrado y [place] de sangre de [place] irregular hasta por seis meses, seguidos de per??odos menstruales muy peque??os.  ??? Anticonceptivo intrauterino Copper (ParaGard) colocado despu??s del procedimiento: anticipe sangrado y Regulatory affairs officer de [address] a seis semanas a Glass blower/designer  de la fecha del procedimiento; luego espere los per??odos menstruales.  ??? Pastillas anticonceptivas, anillo vaginal: puede comenzar un domingo. Puede anticipar manchas de sangre y [address] a seis semanas a partir de la fecha del procedimiento, seguidos de per??odos mensuales seg??n el ciclo anticonceptivo.  ??? Inyecci??n Depo-Provera ?? inmediatamente despu??s del procedimiento: Anticipe manchas de sangre y [address] a seis semanas a partir de la fecha del procedimiento, seguidos de un m??ximo de seis meses de p??rdidas irregulares y de una marcada disminuci??n de la menstruaci??n. Recuerde hacer un seguimiento con su m??dico o cl??nica para repetir la inyecci??n Depo-Provera?? tres meses a partir de la fecha de hoy.  ???    Otro:          Translated by [employer], 11/17/10

## 2020-10-16 ENCOUNTER — Encounter
Admit: 2020-10-16 | Discharge: 2020-10-16 | Payer: PRIVATE HEALTH INSURANCE | Attending: Certified Registered" | Primary: Certified Registered"

## 2020-10-16 ENCOUNTER — Ambulatory Visit: Admit: 2020-10-16 | Discharge: 2020-10-16 | Payer: PRIVATE HEALTH INSURANCE

## 2020-10-16 DIAGNOSIS — Z7982 Long term (current) use of aspirin: Principal | ICD-10-CM

## 2020-10-16 DIAGNOSIS — E119 Type 2 diabetes mellitus without complications: Principal | ICD-10-CM

## 2020-10-16 DIAGNOSIS — E079 Disorder of thyroid, unspecified: Principal | ICD-10-CM

## 2020-10-16 DIAGNOSIS — E785 Hyperlipidemia, unspecified: Principal | ICD-10-CM

## 2020-10-16 DIAGNOSIS — Z79890 Hormone replacement therapy (postmenopausal): Principal | ICD-10-CM

## 2020-10-16 DIAGNOSIS — Z794 Long term (current) use of insulin: Principal | ICD-10-CM

## 2020-10-16 DIAGNOSIS — O021 Missed abortion: Principal | ICD-10-CM

## 2020-10-16 MED ORDER — OXYCODONE 5 MG TABLET
ORAL_TABLET | Freq: Three times a day (TID) | ORAL | 0 refills | 2 days | Status: CP | PRN
Start: 2020-10-16 — End: 2020-10-31

## 2020-10-16 MED ORDER — DOCUSATE SODIUM 100 MG CAPSULE
ORAL_CAPSULE | Freq: Every day | ORAL | 1 refills | 30.00000 days | Status: CP | PRN
Start: 2020-10-16 — End: 2020-11-15

## 2020-10-16 MED ORDER — IBUPROFEN 600 MG TABLET
ORAL_TABLET | Freq: Four times a day (QID) | ORAL | 2 refills | 15 days | Status: CP | PRN
Start: 2020-10-16 — End: 2021-10-16

## 2020-10-16 MED ORDER — PROMETHAZINE 25 MG TABLET
ORAL_TABLET | Freq: Four times a day (QID) | ORAL | 0 refills | 8.00000 days | Status: CP | PRN
Start: 2020-10-16 — End: ?

## 2020-10-16 MED ADMIN — tranexamic acid injection: INTRAVENOUS | @ 13:00:00 | Stop: 2020-10-16

## 2020-10-16 MED ADMIN — lactated Ringers infusion: 10 mL/h | INTRAVENOUS | @ 13:00:00 | Stop: 2020-10-16

## 2020-10-16 MED ADMIN — medroxyPROGESTERone (DEPO-PROVERA) injection 150 mg: 150 mg | INTRAMUSCULAR | @ 14:00:00 | Stop: 2020-10-16

## 2020-10-16 MED ADMIN — fentaNYL (PF) (SUBLIMAZE) injection: INTRAVENOUS | @ 13:00:00 | Stop: 2020-10-16

## 2020-10-16 MED ADMIN — lidocaine (XYLOCAINE) 20 mg/mL (2 %) injection: INTRAVENOUS | @ 13:00:00 | Stop: 2020-10-16

## 2020-10-16 MED ADMIN — midazolam (VERSED) injection: INTRAVENOUS | @ 13:00:00 | Stop: 2020-10-16

## 2020-10-16 MED ADMIN — ondansetron (ZOFRAN) injection: INTRAVENOUS | @ 13:00:00 | Stop: 2020-10-16

## 2020-10-16 MED ADMIN — azithromycin (ZITHROMAX) tablet 500 mg: 500 mg | ORAL | @ 14:00:00 | Stop: 2020-10-16

## 2020-10-16 MED ADMIN — lidocaine (XYLOCAINE) 10 mg/mL (1 %) 20 mL, vasopressin (VASOSTRICT) 4 Units: @ 13:00:00 | Stop: 2020-10-16

## 2020-10-16 MED ADMIN — dexamethasone (DECADRON) 4 mg/mL injection: INTRAVENOUS | @ 13:00:00 | Stop: 2020-10-16

## 2020-10-16 MED ADMIN — propofoL (DIPRIVAN) injection: INTRAVENOUS | @ 13:00:00 | Stop: 2020-10-16
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
## 2020-10-16 NOTE — Unmapped (Signed)
Centro m[facility]dico   [facility]  [address]  [facility] [address]  Loc: [phone]   RESUMEN DE LA VISITA  Kemoni Anayla Giannetti   N??m. de expediente: [phone]       10/16/2020-  PERIOP HBRHBR  [phone]  Los siguientes pasos  --------- Hacer----------  ? Recoja estos medicamentos en [facility] - [place], [place] - [address]  docusate sodium    --------- Asistir----------    6 de enero de 2022 RETURN VIDEO HCP DIRECT LINK 3:30 PM   Llegue a las 3:00 PM   Burnard Hawthorne, RD/LDN  [facility] [place]   [address]   [place] [address]   [phone]     Tiene m??s citas en el futuro. Revise la [employer] de citas.       Instrucciones   Hable con el proveedor de atenci??n m??dica acerca de los medicamentos     Sus medicamentos han cambiado  EMPIECE a usar:  docusate sodium??(COLACE)??  ibuprofen??(MOTRIN)  oxyCODONE??(ROXICODONE)??  promethazine??(PHENERGAN)??    CAMBIE la manera de usar:  insulin NPH??(HumuLIN N NPH U-100 Insulin)????? instrucciones adicionales    SIGA tomando sus otros medicamentos     Revise la lista de medicamentos actualizada a continuaci??n           Otras instrucciones  Planificaci??n familiar [place]    Cirujanas: Tang y Baldemar Friday  Procedimiento realizado: Aspiraci??n endouterina  Hallazgos generales: normal    C??mo contactarnos:  Durante el horario de oficina (8am - 4pm): [phone] (l??nea de enfermer??a)  En cualquier otro momento, incluidos fines de semana, noches, madrugadas y d??as festivos, llame al operador del hospital de la  al [phone] y solicite el buscapersonas de consulta ginecol??gica (GYN Consult Pager)  Para programar Neomia Dear cita: [phone]    ?    Atenci??n de seguimiento:  Realizar?? un seguimiento con: su obstetra ginec??logo principal    ?    Qu?? esperar despu??s de su procedimiento:  No es Therapist, art ni tomar decisiones importantes durante las 24 horas posteriores a la cirug??a.  Debe estar lista para regresar al Aleen Campi o la escuela 1-2 d??as despu??s del procedimiento. Preg??ntenos si necesita una nota de un m??dico  Por las pr??ximas [employer], descanse lo suficiente, beba l??quidos y realice actividades pesadas o extenuantes solo una vez que est?? lista  Se Arts administrator??n antibi??ticos (1 tableta de azitromicina) antes de su procedimiento. Este antibi??tico ayuda a Database administrator infecci??n despu??s de su procedimiento.    Es normal Copy por 2 a 4 semanas. Use toallas maxi para que pueda ver su sangrado. Algunas mujeres no experimentan sangrado, lo cual tambi??n es normal. Si sangra mucho (empapa m??s de 1 toalla sanitaria por hora por m??s de 2 horas) o tiene co??gulos grandes, ll??menos de inmediato. Si a??n tiene sangrado (m??s que solo [employer]) dos semanas despu??s del procedimiento, ll??menos porque necesita que uno de nuestros m??dicos lo vea y lo examine.    El dolor y los calambres son normales por los primeros 1-2 d??as despu??s del procedimiento. Puede usar ibuprofeno, Tylenol o una almohadilla t??rmica para [employer].    Si siente calor o n??useas, revise su temperatura. Si su temperatura es de 100,4o o m??s o se siente mal, ll??menos de inmediato.    No coloque nada en su vagina por [employer]. Su cuello uterino puede estar abierto por [employer]. Sin coito (sexo) ni tampones.    Congesti??n o  sensibilidad mamaria. Si le duelen los senos y / o nota secreci??n, ??telos firmemente con un sost??n ce??ido, una toalla doblada asegurada con imperdibles o un vendaje el??stico de 7 . Se puede usar hielo durante 10 minutos cada media hora. Estimulaci??n, como agua caliente de la ducha debe evitarse.     Tristeza. Ll??menos si tiene dificultades con la tristeza. Podemos recomendarle asistencia adicional. Algunas mujeres encuentran ??til la l??nea directa gratuita: [phone]).    Puede esperar su primer per??odo en 2-6 semanas si no est?? usando ning??n m??todo anticonceptivo.    Si est?? comenzando a usar un anticonceptivo, puede esperar lo siguiente:  Anticoncepci??n intrauterina LNG (Mirena?? o Liletta??) colocada despu??s de su procedimiento: espere sangrado irregular y [employer] por seis meses, seguido de sangrado mensual m??nimo o nulo.  Anticoncepci??n intrauterina de cobre Best boy) colocada despu??s de su procedimiento: espere sangrado y [employer] 2 a 6 semanas despu??s del procedimiento, luego puede esperar per??odos mensuales.  P??ldora anticonceptiva, anillo vaginal: puede comenzar de inmediato. Puede esperar manchado y sangrado durante 2-6 semanas a partir del procedimiento, seguido de la menstruaci??n durante la semana del placebo (p??ldoras sin hormonas o sin anillo en su lugar).  Inyecci??n de Depo-Provera ?? inmediatamente despu??s del procedimiento: espere sangrado y manchado durante 2-6 semanas despu??s del procedimiento, seguido de [place] 6 meses de manchado irregular, seguido de [place] disminuci??n de la menstruaci??n. Recuerde hacer un seguimiento con su m??dico o cl??nica para repetir la inyecci??n de Depo-Provera?? cada 3 meses.  Implante de etonogestrel (Nexplanon ??): puede esperar sangrado irregular o manchado. Si el sangrado se vuelve abundante o molesto, programe una cita de seguimiento con nosotros o con su m??dico local.  Otro:    Dar de alta al [place]  6 de enero de 2022 RETURN VIDEO HCP DIRECT LINK con Burnard Hawthorne, RD/LDN  jueves 6 de enero de 2022 3:30 PM (Llegue a las 3:00 PM)   [facility] NUTRITION [facility] [place]  [address]   [place] [address]  [phone]   18 de enero de 2022 RETURN RHEUM DERM con Jacquelin Hawking, MD  martes 18 de enero de 2022 11:00 AM (Llegue a las 10:45 AM)   [facility] RHEUMATOLOGY SPECIALTY [place]  [address]   [place] [address]  [phone]   19 de enero de 2022 US OB TARGETED 2ND/3RD TRIMESTER SINGLE GESTATION  mi??rcoles 19 de enero de 2022 2:00 PM (Llegue a las 1:45 PM)  [place]  5 a??os en adelante; Endovaginal (EV), transvaginal (TV); Necesidad transabdominal de tener la vejiga llena. IMG [place] [facility] [address]  [address]   [place] [address]  [phone]   28 de enero de 2022 NEW ??RETINA con Ronnette Juniper, MD  viernes 28 de enero de 2022 10:15 AM    [address]MOLOGY [address] [place]  [address]   [address]   [place] [address]  [phone]         Motivo de la hospitalizaci??n  Su diagn??stico primario fue: No se encuentra en archivo      M??dicos que lo atendieron durante la hospitalizaci??n  Proveedor Servicio Funci??n  Especialidad   Barb Merino, MD -- Harl Bowie a cargo obstetricia y ginecolog??a      Es al??rgico a lo siguiente  [place] de medicamentos   Por la ma??ana Por  la tarde Por la noche A la hora de irse a dormir Cuando sea necesario   aspirin 81 mg Cap  Tomar por via oral.           blood-glucose meter Misc  Com??nmente conocido como: FREESTYLE INSULINX  Revisar en ayunas y Neomia Dear hora despu??s de cada comida. De cualquier marca est?? bien.           * clobetasoL 0.05 % external solution  Com??nmente conocido como: TEMOVATE  Aplicar cutaneamente a diario en el cuero cabelludo.           * clobetasoL 0.05 % ointment  Com??nmente conocido como: TEMOVATE  Aplicar dos veces al d??a a sarpullido enrojecido y levantado por 2 semanas.           EMPIECE    docusate sodium 100 MG capsule  Com??nmente conocido como: COLACE  Tomar 1 c??psula (100 mg total) por via oral a diario seg??n sea necesario para estre??imiento.           empty container Misc  Usar seg??n las instrucciones para desechar plumas de Humira.         glucagon 1 mg Solr  Kit de emergencia de glucagon: usar solamente seg??n las instrucciones         halobetasol 0.05 % ointment  Com??nmente conocido como: ULTRAVATE  Aplicar cutaneamente dos (2) veces al d??a. Para la soriasis en el cuerpo.         HUMIRA(CF) PEN 40 mg/0.4 mL injection  Inyecte el contenido de 1 lapicero (40mg  total) bajo la piel cada catorce (14) d??as.        EMPIECE    ibuprofen 600 MG tablet  Com??nmente conocido como: MOTRIN  Tomar 1 tableta (600 mg total) por via oral cada seis (6) horas seg??n sea necesario para dolor.           CAMBIE    insulin NPH 100 unit/mL injection  Com??nmente conocido como: HumuLIN N NPH U-100 Insulin    Humulin or Novolin NPH vial (brand substitution allowed per insurance preference): usar 20 unidades antes del desayuno, 10 unidades al [employer]     Seg??n el archivo es posible que estaba usando este medicamento de una manera diferente.     Lo que cambi??: instrucciones adicionales             insulin syringe-needle U-100 1 mL 31 x 3/8 (10 mm) Syrg  Usar con insulina seg??n las [employer] al d??a seg??n las instrucciones.           levothyroxine 125 MCG tablet  Com??nmente conocido como: SYNTHROID  Tomar 137 mcg por via oral a diario.         multivitamin, prenatal (folic acid-iron) 27-1 mg Tab  Tomar 1 tableta por via oral a diario.         EMPIECE    oxyCODONE 5 MG immediate release tablet  Com??nmente conocido como: ROXICODONE  Tomar 1 tableta (5 mg total) por via oral cada ocho (8) horas seg??n sea necesario para dolor hasta por 15 d??as.         EMPIECE    promethazine 25 MG tablet  Com??nmente conocido como:   PHENERGAN   Tomar 1 tableta (25 mg total) por via oral cada seis (6) horas seg??n sea necesario.         *Esta lista tiene 2 medicamentos que son los mismos que otros medicamentos que se le han recetado. Lea las instrucciones con cuidado y pregunte a  su doctor o alg??n otro proveedor de [employer] revise con usted.        D??nde recoger los medicamentos                   Recoja estos medicamentos en [facility] - [place], [place] - [address]     docusate sodium ??? ibuprofen ??? oxyCODONE ??? promethazine    Direcci??n: [address] [place] [address]   Tel??fono: [phone]     Learning About Safely Storing and Getting Rid of Opioid Pills and Patches  Why are opioid pills and patches dangerous?  ?   Opioids are medicines used to relieve moderate to severe pain. They may be used for a short time for pain, such as after surgery. Or they may be used to relieve long-term pain. When your doctor prescribes an opioid, you're getting strong medicine. It's important to protect yourself and others from its risks. Opioid medicine can cause serious problems, and even death, if it's misused.  Children and pets are at high risk when an opioid is kept within their reach. Opioid skin patches, such as fentanyl, are the most dangerous. Even a used patch still has a high dose of medicine in it. Small children have been killed by opioid patches they've found in the trash at home.  Opioids can also be misused or stolen. Be sure to store your medicine in a safe and secure place. When you are done using opioid medicine, get rid of it right away, and in the safest way you can.  How do you safely store opioid pills and patches?  It's important to store opioids safely so that they aren't used by the wrong person. Your pain medicine is only for you to take. If someone else takes your medicine, it can harm that person.  You can safely store your medicine. Follow these tips.  ?? Store pills and patches up high and out of sight.  ? Keep them away from children and pets.  ? Return the container to the same place each time you take your medicine.  ?? Try locking your opioid medicine in a cabinet.  ?? Make sure the bottles are closed tightly.   If they have a safety cap, make sure that it's locked. Tighten the cap until you hear a click or can't twist it anymore.  ?? Keep track of how many pills or patches you have left.   You may want to keep track in a notebook.  ?? Let the people who live with you know about your medicine.  ? Tell them that it is only for you to take.  ? If guests have opioid medicine with them, ask them to keep it safe.  How do you safely get rid of opioid pills and patches?  If you have opioid pills or patches that you are not going to use, get rid of them right away. Do not keep your opioid medicine or opioid patches for later use.  The U.S. Food and Drug Administration (FDA) recommends that you take your opioid pills and patches to a drop-off box or take-back program that is authorized by the U.S. Metallurgist (DEA). Your local trash and recycle center, pharmacy, or hospital may offer one of these.  If you can't get to a DEA-authorized site right away and your medicine doesn't have specific disposal information (such as flushing), you can dispose of them in your household trash using  these steps.  ?? Take the medicine out of its container.  ?? Mix it with something that tastes bad, such as cat litter or coffee grounds.  ?? Place the mixture in a sealed plastic bag, and put the bag in your household trash.  Only flush your medicine down the toilet if you can't get to a DEA-approved site or if your medicine instructions state clearly to flush them. You can also go to the FDA website to see a list of medicines that should be flushed.  Take special care with used opioid patches. As soon as you peel a patch off of your skin, fold it in half with the sticky sides together. Immediately take it to a DEA-authorized site or flush it down the toilet if a DEA-authorized site isn't available in your area. Do not throw them in the trash.  Where can you go to learn more?  To learn how and where to get rid of unused medicines in your area, ask your doctor or pharmacist for help. Your local trash and recycle center may have a drop-off site. You can also look online at the [employer] website (deadiversion.PrepaidCDs.co.nz) to find a disposal site near you. Or you can visit [place]StreetMaps.no and search for unused medicine disposal.  Follow-up care is a key part of your treatment and safety. Be sure to make and go to all appointments, and call your doctor if you are having problems. It's also a good idea to know your test results and keep a list of the medicines you take.  Where can you learn more?  Go to My[facility]Chart at https://myuncchart.Armed forces logistics/support/administrative officer in the Menu. Enter [id number] in the search box to learn more about Learning About Safely Storing and Getting Rid of Opioid Pills and Patches.  Current as of: February 02, 2020??????????????????????????????Content Version: 13.1  ?? 2006-2021 Healthwise, Incorporated.   Care instructions adapted under license by [facility]. If you have questions about a medical condition or this instruction, always ask your healthcare professional. Healthwise, Incorporated disclaims any warranty or liability for your use of this information.  ??     Informaci??n adjunta  Informaci??n de recursos ante una crisis:  L??neas directas nacionales de prevenci??n del suicidio:  1-800-SUICIDE (316)559-4267 en espa??ol o 1-800-273-TALK (559) 035-5815) en ingl??s     L??neas de atenci??n ante Neomia Dear crisis de [place]:   [phone]    MyChart  ??Env??e mensajes al m??dico, revise los resultados de [place] m??dicas, renueve las recetas, haga citas y Arvella Merles m??s!     Vaya a https://myuncchart.org y haga clic en ??Activate Your Account??. [place] su c??digo de activaci??n de My [place] Chart exactamente como aparece a continuaci??n junto con su fecha de nacimiento para completar el proceso de activaci??n.      Mi c??digo de activaci??n de My [place] Chart: No se gener?? un c??digo de activaci??n  Estado actual de MyChart: Activo       Si necesita ayuda con My [place] Chart, llame a [facility] al [phone].     Care Everywhere CEID  [id number]: Cleda Clarks n??mero de identificaci??n se puede usar si otra instalaci??n m??dica que [employer] el programa Epic necesita solicitar el expediente m??dico de [place].                 Camry Breann Losano N??m. de expediente: [phone]     CSN: [id number]   SA: [facility] Report:-IP After Visit Summary      Al  firmar Jannet Askew, yo reconozco que recib?? y entiendo las instrucciones del alta precedentes y materiales educativos para el paciente adjuntos (si los hay).   By signing below, I acknowledge that I have received and understand the foregoing discharge instructions and accompanying patient education materials (if any).        _________________________________________________  Chales Salmon del paciente/representante autorizado/adulto responsable  Signature of Patient/Authorized Representative/Responsible Adult        _________________________________________________  Nemiah Commander de imprenta y relaci??n con el paciente:   Printed Name and Relationship to Patient      Franco Nones y hora: _____________________________________  Date and Time        _________________________________________________  Sherald Hess la enfermera u otro proveedor:  Signature of Nurse or Other Provider        _________________________________________________  Nemiah Commander de imprenta y credenciales:   Printed Name and Credentials      Fecha y hora: ______________________________________  Date and Time

## 2020-10-16 NOTE — Unmapped (Signed)
OPERATIVE NOTE FOR STANDARD UTERINE VACUUM ASPIRATION    Patient: Stephanie Hester  Medical Record Number: [id number]  Date of Surgery: 10/16/20  Surgeon: Sudie Grumbling, MD, MPH (Attending Physician)  Assistant(s): Elyn Peers, MD, MPH (Family Planning Fellow)    Procedure: Standard Uterine Vacuum Aspiration     Indications:  Stephanie Hester is a 39 y.o. G3P1011 at 10 wks with missed abortion who desires uterine vacuum aspiration for   early pregnancy loss    For contraception, patient desires depomedroxyprogesterone acetate    Anesthesia: General with intubation and paracervical block with 20 mL of 1% lidocaine with 4U vasopressin    IVF:  UOP: 20mL  EBL: 25mL    Findings:  1. Gritty texture of uterus following procedure  2. Products of conception consistent with gestational age seen on gross examination of specimen  3. Prophylactic TXA given for bleeding. Thin stripe on [place]. Hemostasis at end of case    Details: The patient was taken to the operating room where she was placed under anesthesia without complication. A speculum was placed in the patient's vagina. The cervix was then cleaned using Betadine solution. A single-tooth tenaculum was then applied to the cervix. A paracervical block was then placed using 20mL of 1% lidocaine. The cervix was then dilated using Pratt dilators to a size 33 [place]. The size 11 suction curette was advanced under ultrasound guidance, and the contents of the uterus were removed.  An 8mm suction cannula was also used until a gritty texture was noted by the surgeon and a thin endometrial stripe was noted on ultrasound.      The tenaculum was removed from the anterior lip of the patient's cervix, which was noted to be hemostatic. The speculum was then removed from the patient's vagina.     The products of conception were examined and found to be consistent with a 10 week fetus.    The patient tolerated the procedure well.     Sponge, lap, and needle counts were correct.     The patient was taken to the recovery room in stable condition.     Complications: none     Condition: stable to RR    Specimens:  POC--> Routine path and cytogenetics

## 2020-10-27 DIAGNOSIS — Z794 Long term (current) use of insulin: Principal | ICD-10-CM

## 2020-10-27 DIAGNOSIS — K760 Fatty (change of) liver, not elsewhere classified: Principal | ICD-10-CM

## 2020-10-27 DIAGNOSIS — E119 Type 2 diabetes mellitus without complications: Principal | ICD-10-CM

## 2020-10-27 DIAGNOSIS — Z7984 Long term (current) use of oral hypoglycemic drugs: Principal | ICD-10-CM

## 2020-10-27 DIAGNOSIS — E669 Obesity, unspecified: Principal | ICD-10-CM

## 2020-10-27 DIAGNOSIS — E785 Hyperlipidemia, unspecified: Principal | ICD-10-CM

## 2020-10-27 DIAGNOSIS — O021 Missed abortion: Principal | ICD-10-CM

## 2020-10-27 DIAGNOSIS — K219 Gastro-esophageal reflux disease without esophagitis: Principal | ICD-10-CM

## 2020-10-27 DIAGNOSIS — O034 Incomplete spontaneous abortion without complication: Principal | ICD-10-CM

## 2020-10-27 DIAGNOSIS — U071 COVID-19: Principal | ICD-10-CM

## 2020-10-27 DIAGNOSIS — E039 Hypothyroidism, unspecified: Principal | ICD-10-CM

## 2020-10-27 DIAGNOSIS — Z79890 Hormone replacement therapy (postmenopausal): Principal | ICD-10-CM

## 2020-10-27 DIAGNOSIS — L409 Psoriasis, unspecified: Principal | ICD-10-CM

## 2020-10-28 ENCOUNTER — Encounter
Admit: 2020-10-28 | Discharge: 2020-10-28 | Disposition: A | Discharge status: Home / Self Care | Payer: PRIVATE HEALTH INSURANCE | Attending: Student in an Organized Health Care Education/Training Program

## 2020-10-28 ENCOUNTER — Emergency Department
Admit: 2020-10-28 | Discharge: 2020-10-28 | Disposition: A | Discharge status: Home / Self Care | Payer: PRIVATE HEALTH INSURANCE | Attending: Student in an Organized Health Care Education/Training Program

## 2020-10-28 ENCOUNTER — Ambulatory Visit
Admit: 2020-10-28 | Discharge: 2020-10-28 | Disposition: A | Discharge status: Home / Self Care | Payer: PRIVATE HEALTH INSURANCE | Attending: Student in an Organized Health Care Education/Training Program

## 2020-10-28 DIAGNOSIS — E669 Obesity, unspecified: Principal | ICD-10-CM

## 2020-10-28 DIAGNOSIS — Z7984 Long term (current) use of oral hypoglycemic drugs: Principal | ICD-10-CM

## 2020-10-28 DIAGNOSIS — K219 Gastro-esophageal reflux disease without esophagitis: Principal | ICD-10-CM

## 2020-10-28 DIAGNOSIS — E119 Type 2 diabetes mellitus without complications: Principal | ICD-10-CM

## 2020-10-28 DIAGNOSIS — Z794 Long term (current) use of insulin: Principal | ICD-10-CM

## 2020-10-28 DIAGNOSIS — L409 Psoriasis, unspecified: Principal | ICD-10-CM

## 2020-10-28 DIAGNOSIS — U071 COVID-19: Principal | ICD-10-CM

## 2020-10-28 DIAGNOSIS — O021 Missed abortion: Principal | ICD-10-CM

## 2020-10-28 DIAGNOSIS — E785 Hyperlipidemia, unspecified: Principal | ICD-10-CM

## 2020-10-28 DIAGNOSIS — Z79890 Hormone replacement therapy (postmenopausal): Principal | ICD-10-CM

## 2020-10-28 DIAGNOSIS — E039 Hypothyroidism, unspecified: Principal | ICD-10-CM

## 2020-10-28 DIAGNOSIS — O034 Incomplete spontaneous abortion without complication: Principal | ICD-10-CM

## 2020-10-28 DIAGNOSIS — K760 Fatty (change of) liver, not elsewhere classified: Principal | ICD-10-CM

## 2020-10-28 LAB — CBC W/ AUTO DIFF
BASOPHILS ABSOLUTE COUNT: 0 10*9/L (ref 0.0–0.1)
BASOPHILS RELATIVE PERCENT: 0.3 %
EOSINOPHILS ABSOLUTE COUNT: 0.3 10*9/L (ref 0.0–0.4)
EOSINOPHILS RELATIVE PERCENT: 2.3 %
HEMATOCRIT: 36.3 % (ref 36.0–46.0)
HEMOGLOBIN: 11.8 g/dL — ABNORMAL LOW (ref 12.0–16.0)
LARGE UNSTAINED CELLS: 1 % (ref 0–4)
LYMPHOCYTES ABSOLUTE COUNT: 3.9 10*9/L (ref 1.5–5.0)
LYMPHOCYTES RELATIVE PERCENT: 29 %
MEAN CORPUSCULAR HEMOGLOBIN CONC: 32.7 g/dL (ref 31.0–37.0)
MEAN CORPUSCULAR HEMOGLOBIN: 29.8 pg (ref 26.0–34.0)
MEAN CORPUSCULAR VOLUME: 91.2 fL (ref 80.0–100.0)
MEAN PLATELET VOLUME: 8.1 fL (ref 7.0–10.0)
MONOCYTES ABSOLUTE COUNT: 0.6 10*9/L (ref 0.2–0.8)
MONOCYTES RELATIVE PERCENT: 4.3 %
NEUTROPHILS ABSOLUTE COUNT: 8.4 10*9/L — ABNORMAL HIGH (ref 2.0–7.5)
NEUTROPHILS RELATIVE PERCENT: 62.7 %
PLATELET COUNT: 486 10*9/L — ABNORMAL HIGH (ref 150–440)
RED BLOOD CELL COUNT: 3.98 10*12/L — ABNORMAL LOW (ref 4.00–5.20)
RED CELL DISTRIBUTION WIDTH: 15.3 % — ABNORMAL HIGH (ref 12.0–15.0)
WBC ADJUSTED: 13.4 10*9/L — ABNORMAL HIGH (ref 4.5–11.0)

## 2020-10-28 LAB — COMPREHENSIVE METABOLIC PANEL
ALBUMIN: 3.9 g/dL (ref 3.4–5.0)
ALKALINE PHOSPHATASE: 110 U/L (ref 46–116)
ALT (SGPT): 28 U/L (ref 10–49)
ANION GAP: 7 mmol/L (ref 5–14)
AST (SGOT): 34 U/L (ref <=34)
BILIRUBIN TOTAL: 0.3 mg/dL (ref 0.3–1.2)
BLOOD UREA NITROGEN: 17 mg/dL (ref 9–23)
BUN / CREAT RATIO: 27
CALCIUM: 9.5 mg/dL (ref 8.7–10.4)
CHLORIDE: 110 mmol/L — ABNORMAL HIGH (ref 98–107)
CO2: 22 mmol/L (ref 20.0–31.0)
CREATININE: 0.64 mg/dL
EGFR CKD-EPI AA FEMALE: >90 mL/min/{1.73_m2} (ref >=60)
EGFR CKD-EPI NON-AA FEMALE: >90 mL/min/{1.73_m2} (ref >=60)
GLUCOSE RANDOM: 128 mg/dL (ref 70–179)
POTASSIUM: 3.9 mmol/L (ref 3.4–4.5)
PROTEIN TOTAL: 7.5 g/dL (ref 5.7–8.2)
SODIUM: 139 mmol/L (ref 135–145)

## 2020-10-28 LAB — PROTIME-INR
INR: 0.86
PROTIME: 10.1 s — ABNORMAL LOW (ref 10.3–13.4)

## 2020-10-28 LAB — HCG QUANTITATIVE, BLOOD: GONADOTROPIN, CHORIONIC (HCG) QUANT: 7.9 m[IU]/mL

## 2020-10-28 LAB — APTT
APTT: 27.4 s (ref 24.9–36.9)
HEPARIN CORRELATION: 0.2

## 2020-10-28 MED ORDER — METFORMIN ER 500 MG 24 HR TABLET,EXTENDED RELEASE: 500 mg | Freq: Two times a day (BID) | 0 refills | 0 days

## 2020-10-28 MED ADMIN — propofoL (DIPRIVAN) injection: INTRAVENOUS | @ 15:00:00 | Stop: 2020-10-28

## 2020-10-28 MED ADMIN — dexamethasone (DECADRON) 4 mg/mL injection: INTRAVENOUS | @ 15:00:00 | Stop: 2020-10-28

## 2020-10-28 MED ADMIN — phenylephrine 20 mg in sodium chloride 0.9% 250 mL (80 mcg/mL) infusion PMB: INTRAVENOUS | @ 15:00:00 | Stop: 2020-10-28

## 2020-10-28 MED ADMIN — ondansetron (ZOFRAN) injection: INTRAVENOUS | @ 15:00:00 | Stop: 2020-10-28

## 2020-10-28 MED ADMIN — lactated Ringers infusion: INTRAVENOUS | @ 14:00:00 | Stop: 2020-10-28

## 2020-10-28 MED ADMIN — lidocaine (XYLOCAINE) 10 mg/mL (1 %) injection: @ 15:00:00 | Stop: 2020-10-28

## 2020-10-28 MED ADMIN — fentaNYL (PF) (SUBLIMAZE) injection: INTRAVENOUS | @ 15:00:00 | Stop: 2020-10-28

## 2020-10-28 MED ADMIN — lidocaine (XYLOCAINE) 20 mg/mL (2 %) injection: INTRAVENOUS | @ 15:00:00 | Stop: 2020-10-28

## 2020-10-28 MED ADMIN — MORPhine 4 mg/mL injection 4 mg: 4 mg | INTRAVENOUS | @ 14:00:00 | Stop: 2020-10-28

## 2020-10-28 MED ADMIN — ketorolac (TORADOL) injection: INTRAVENOUS | @ 15:00:00 | Stop: 2020-10-28

## 2020-10-28 MED ADMIN — midazolam (VERSED) injection: INTRAVENOUS | @ 15:00:00 | Stop: 2020-10-28

## 2020-10-28 MED ADMIN — azithromycin (ZITHROMAX) tablet 500 mg: 500 mg | ORAL | @ 16:00:00 | Stop: 2020-10-28
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
## 2020-10-28 NOTE — Unmapped (Signed)
[facility]   [facility]  [address]  [place] [address]  Loc: [phone]   RESUMEN DE LA VISITA  Stephanie Hester   N??m. de expediente: [phone]     Retained products of conception following abortion  10/27/2020-  MAIN PERIOP [facility]  [phone]  Los siguientes pasos    --------- Asistir----------  16 de enero del 2022 RETURN RHEUM DERM a las 11:00 a. m.   llegue a las 10:45 a. m.   Jacquelin Hawking, MD  [facility] R[facility]UMATOLOGY SPECIALTY [place]   [address]   [place] [address]   [phone]     Tiene m??s citas el mismo d??a.  Revise la [employer] de citas.     Instrucciones    Sus medicamentos han cambiado  CAMBIE la manera de usar:   insulin NPH??(HumuLIN N NPH U-100 Insulin)????? instrucciones adicionales    SIGA tomando sus otros medicamentos     Revise la lista de medicamentos actualizada a continuaci??n        Resultados de laboratorio pendientes     Sherryl Barters actual   Pregnancy Qualitative, Urine (Sent to lab) Recolectado (10/28/20 0158)        Otras instrucciones    Instrucciones del alta     [place] [facility]       Cirujano: Dr. Beverely Pace   Procedimiento que se realize: Aspiraci??n por vac??o uterino  Hallazgos generales: co??gulo retenido despu??s de un aborto espont??neo    INFORMACI??N DE CONTACTO Y CU??NDO LLAMARNOS:  Durante el horario de oficina: (8:00 a. m.- 4:00 p. m.) [phone] (linea de la enfermera)   A otras horas, incluyendo los fines de semanas, noches, temprano en las ma??anas y d??as feriados.  Llame a la operadora del [facility] [place] al [phone] pregunte por el ??GYN Consult Pager??, el m??dico de ginecolog??a de [place].    Para programar Neomia Dear cita: [phone]     Cuidados de seguimiento???   Usted tendra un seguimiento con:  su ginecologo obstetra primario     Qu?? esperar despu??s de su procedimiento:  No es Therapist, art ni tomar Scientist, research (life sciences) las 24 horas despu??s de la cirug??a.  Debe estar listo para regresar al Aleen Campi o la escuela 1-2 d??as despu??s del procedimiento.   Dejanos saber si necesita una nota de un m??dico  Durante las pr??ximas [employer], descanse lo suficiente, beba l??quidos y solamente hasta que este lista realice actividades pesadas o extenuantes   Se le administrar??n antibi??ticos (1 tableta de azitromicina) antes de su procedimiento. Este antibi??tico ayuda a Database administrator infecci??n despu??s de su procedimiento.   Es normal sangrar o Scientist, research (life sciences) 2 a 4 semanas. Use toallas sanitarias maxi para que pueda ver su sangrado. Algunas mujeres no experimentan sangrado, lo cual tambi??n es normal. Si sangra mucho (empapa m??s de 1 toalla sanitaria por hora durante m??s de 2 horas) o tiene co??gulos grandes, ll??menos de inmediato. Si a??n tiene sangrado (m??s que solo [employer]) dos semanas despu??s del procedimiento, ll??menos porque necesita que uno de nuestros m??dicos lo vea y lo examine.  El dolor y los c??licos son normales durante los primeros 1-2 d??as despu??s del procedimiento. Puede usar ibuprofeno, Tylenol o una almohadilla t??rmica para [employer].    Si siente calor o enfermo mida su temperatura. Si su temperatura es de 100.4o o m??s o se siente mal, ll??menos de inmediato.    No coloque nada en su vagina [employer]. Su cuello uterino puede  estar [employer] Health. No coito (sexo) ni tampones.    Enfordamiento o sensibilidad de las mamas. Si le duelen los senos y / o nota secreci??n, ??telos firmemente con un sost??n ajustado, una toalla doblada asegurada con pasadores o un vendaje ACE de 7 . Se puede usar hielo durante 10 minutos cada media hora. Estimulaci??n, como el agua caliente de la ducha debe de evitarse.    Tristeza. Ll??menos si tiene dificultades con la tristeza. Podemos recomendarle asistencia adicional. Algunas mujeres encuentran ??til la l??nea directa gratuita: [phone]).  Puede esperar su primer per??odo en 2-6 semanas si no est?? usando ning??n m??todo anticonceptivo.  Si est?? comenzando con el m??todo anticonceptivo, puede esperar lo siguiente:  Anticoncepci??n intrauterina de LNG (Mirena?? o Liletta??) colocada despu??s de su procedimiento: espere sangrado irregular y [employer] por seis meses, seguido de sangrado mensual m??nimo o nulo.  Anticoncepci??n intrauterina de cobre Best boy) colocada despu??s de su procedimiento: espere sangrado y [employer] 2 a 6 semanas despu??s del procedimiento, luego puede esperar per??odos mensuales.  P??ldora anticonceptiva, anillo vaginal: puede comenzar de inmediato. Puede esperar manchado y sangrado durante 2-6 semanas a partir del procedimiento, seguido de la menstruaci??n durante la semana del placebo (p??ldoras sin hormonas o sin anillo en su lugar).  Inyecci??n de Depo-Provera ?? inmediatamente despu??s del procedimiento: se espera sangrado y manchado durante 2 a 6 semanas despu??s del procedimiento, seguido de [place] 6 meses de manchado irregular, seguido de [place] disminuci??n de la menstruaci??n. Recuerde hacer un seguimiento con su m??dico o cl??nica para repetir la inyecci??n de Depo-Provera?? cada 3 meses.  Implante de etonogestrel (Nexplanon ??): puede esperar sangrado irregular o manchado. Si el sangrado se vuelve abundante o molesto, programe una cita de seguimiento con nosotros o con su m??dico local.             Seguimiento  16 de enero RETURN RHEUM DERM with Jacquelin Hawking, MD  [facility] 18 de enero del 2022 a las 11:00 a. m. (llegue a las 10:45 a. m.)   [facility]UMATOLOGY SPECIAL[facility] [place]  [address] [place] [address]   [place] [address]  [phone]   17 de enero US OB TARGETED 2ND/3RD TRIMESTER SINGLE GESTATION  Mi??rcoles 19 del 2022 a las 2:00 p. m. (llegue a las 1:45 p, m.)  [place]  28yrs and up; Endovaginal (EV), transvaginal (TV); Transabdominal need to have full bladder. IMG [place] [facility] [address]  [address]   [place] [address]  [phone]   26 de enero NEW ??RETINA with Ronnette Juniper, MD  26 de enero del 2022 a las 10:15 a. m.   [facility] [address] [place]  [address]   [address]   [place] [address]  [phone]         Motivo de la hospitalizaci??n  Su diagn??stico primario fue: Aborto      M??dicos que lo atendieron [employer] hospitalizaci??n  No se encuentra en archivo       Es al??rgico a lo siguiente  Al??rgeno   Se desconoce que tenga alergias           Lista de medicamentos      [place] En la noche  A la hora de irse a dormir Seg??n se necesita    aspirin 81 mg Cap  Tome 81 mg por v??a oral a diario con la comida de la noche.  blood-glucose meter Misc  Com??nmente conocido como:   FREESTYLE INSULINX  Hacer prueba en ayunas y Neomia Dear hora antes de cada comida. Cualquier [employer].          * clobetasoL 0.05 % external solution  Com??nmente conocido como: TEMOVATE  Aplicar de forma t??pica en el cuero cabelludo.          * clobetasoL 0.05 % ointment  Com??nmente conocido como: TEMOVATE  Aplicar dos veces al d??a sobre el salpullido rojo y elevado durante 2 semanas.          docusate sodium 100 MG capsule  Com??nmente conocido como: COLACE  Tome 1 c??psula (100 mg en total) por v??a oral a diario seg??n se necesite para el estre??imiento.          empty container Misc  Use segun se le ha indicado para deshacerse de las plumas usadas de Humira.         glucagon 1 mg Solr  Glucagon emergency kit: use solamente como se le ha indicado.  Zollie Beckers gen??rico: glucagon         GLUMETZA 500 MG (MOD) 24 hr tablet  Tome 500 mg por v??a oral dos (2) veces al d??a.  Nombre gen??rico: metFORMIN         halobetasol 0.05 % ointment  Com??nmente conocido como: ULTRAVATE  Aplicar de forma t??pica dos (2) veces al d??a. A la soriasis en el cuerpo.          HUMIRA(CF) PEN 40 mg/0.4 mL injection  Inyecte el contenido de 1 lapicero (40mg  en total) bajo la piel cada catorce (14) d??as.  Nombre gen??rico: adalimumab         ibuprofen 600 MG tablet  Com??nmente conocido como: MOTRIN  Tome 1 tableta (600 mg en total) por v??a oral cada seis (6) horas seg??n se necesite para el dolor.        CAMBIE  insulin NPH 100 unit/mL injection  Commonly known as: HumuLIN N NPH U-100 Insulin  Humulin or Novolin NPH vial (brand substitution allowed per insurance preference): tome 20 unidades antes del desayunot, 10 unidades a las hora de irse a dormir.  De acuerdo a nuestros registros, usted puede haber estadi  Seg??n nuestros Chief Strategy Officer, es posible que haya estado tomando este medicamento de [place].  Lo que cambi??: instrucciones adicionales         insulin syringe-needle U-100 1 mL 31 x 3/8 (10 mm) Syrg  Use con la insulina seg??n se le ha [employer] al d??a.          levothyroxine 125 MCG tablet  Commonly known as: SYNTHROID  Tome 137 mcg por v??a oral a diario.         multivitamin, prenatal (folic acid-iron) 27-1 mg Tab  Tome 1 tableta por v??a oral a diario.         oxyCODONE 5 MG immediate release tablet  Com??nmente conocido como: ROXICODONE  Tome 1 tableta  (5 mg en total) por v??a oral cada ocho (8) horas seg??n se necesite para el dolor. hasta por 15 dias.          promethazine 25 MG tablet  Com??nmente conocido como: PHENERGAN  Tome 1 tableta (25 mg en total) por v??a oral cada seis (6) horas seg??n se necesite.          *Esta lista tiene 2 medicamentos que son los mismos que otros medicamentos que se le han recetado. Lea las instrucciones con cuidado y pregunte a  su doctor o alg??n otro proveedor de [employer] revise con usted.           Informaci??n adjunta     Leer adjuntos    Informaci??n de recursos ante una crisis:  L??neas directas nacionales de prevenci??n del suicidio:  1-800-SUICIDE 336-445-2597 en espa??ol o 1-800-273-TALK [phone]) en ingl??s     L??neas de atenci??n ante Neomia Dear crisis de [place]:   [phone]    MyChart  ??Env??e mensajes al m??dico, revise los resultados de [place] m??dicas, renueve las recetas, haga citas y Arvella Merles m??s! Vaya a [email or web address] y haga clic en ??Activate Your Account??. [place] su c??digo de activaci??n de My [place] Chart exactamente como aparece a continuaci??n junto con su fecha de nacimiento para completar el proceso de activaci??n.      Mi c??digo de activaci??n de My [place] Chart:   No se gener?? un c??digo de activaci??n  Estado actual de MyChart: Activo       Si necesita ayuda con My [place] Chart, llame a [facility] al [phone].     Care Everywhere CEID  [phone]: Cleda Clarks n??mero de identificaci??n se puede usar si otra instalaci??n m??dica que [employer] el programa Epic necesita solicitar el expediente m??dico de .                 Madissen Zekiah Coen N??m. de expediente: [phone]     CSN: [id number]   SA: [facility] SERVICE ARE[facility] Report:-IP After Visit Summary      Al Sallye Ober, yo reconozco que recib?? y entiendo las instrucciones del alta precedentes y materiales educativos para el paciente adjuntos (si los hay).   By signing below, I acknowledge that I have received and understand the foregoing discharge instructions and accompanying patient education materials (if any).    Firma del paciente/representante autorizado/adulto responsable  Signature of Patient/Authorized Representative/Responsible Adult    Nombre en letra de imprenta y relaci??n con Retail banker Name and Relationship to Patient    Franco Nones y hora  Date and Time    Firma de la enfermera u otro proveedor   Public house manager of Nurse or Other Provider    Nombre en letra de imprenta y credenciales   Printed Name and Credentials    Fecha y hora   Date and Time

## 2020-10-28 NOTE — Unmapped (Signed)
Pt comes in for complaint of vaginal bleeding, and expelling large clots. Pt reports cramping and pain in her pelvis.   Pt states had stillborn and had vacuum extraction done at [facility] on 12/25 and was doing fine, but today started having a lot of bleeding.
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
## 2020-10-28 NOTE — Unmapped (Signed)
OPERATIVE NOTE FOR STANDARD UTERINE VACUUM ASPIRATION    Patient: Stephanie Hester  Medical Record Number: [id number]  Date of Surgery: 10/28/20  Surgeon: Cheryll Cockayne, MD, [facility] (Attending Physician)  Assistant(s): Raylene Miyamoto, MD, MPH (Family Planning Fellow)    Procedure: Standard Uterine Vacuum Aspiration     Indications:  [name] is a 40 y.o. G3P1011 status post vacuum aspiration at 10 wks with missed abortion on 10/16/2020 who returns with vaginal bleeding and concern for retained products on ultrasound.     For contraception, patient desires none    Anesthesia: MAC with LMA and paracervical block with 20 mL of 1% lidocaine    IVF: 500 mL  UOP: 50 mL  EBL: 50 mL    Findings:  1. Gritty texture of uterus following procedure  2. Products of conception consistent with blood clot and possible retained tissue    Details: The patient was taken to the operating room where she was placed under anesthesia without complication. A speculum was placed in the patient's vagina. The cervix was then cleaned using Betadine solution. A single-tooth tenaculum was then applied to the cervix. A paracervical block was then placed using 20mL of 1% lidocaine. The cervix was then dilated using Pratt dilators to a size 25 [place]. The size 8 suction curette was advanced under ultrasound guidance, and the contents of the uterus were removed.  Two passes with a sharp curette were used to remove an area of clot at the uterine fundus. An10 mm suction cannula was also used until a gritty texture was noted by the surgeon and a thin endometrial stripe was noted on ultrasound.      The tenaculum was removed from the anterior lip of the patient's cervix, which was noted to be hemostatic. The speculum was then removed from the patient's vagina.     The products of conception were examined and found to be consistent with old blood clot and tissue.    The patient tolerated the procedure well.     Sponge, lap, and needle counts were correct.     The patient was taken to the recovery room in stable condition.     Complications: none     Condition: stable to RR    Specimens:  POC--> Disposal    Yetta Barre. Sheilah Mins, MD MPH  Complex Family Planning Fellow, PGY-5  Department of OBGYN  [place] [facility] [place] [facility] [place]  Pager: [phone]

## 2020-10-28 NOTE — Unmapped (Signed)
Bed: 70-D  Expected date:   Expected time:   Means of arrival:   Comments:

## 2020-11-01 NOTE — Unmapped (Signed)
Documentation Note  11/01/20    Informed that patient's cytogenetics was denied as it was not noted in chart. The plan for cytogenetics was sent to me via the referral and patient expressed interest. We did discuss that given age, a miscarriage was not uncommon and genetic testing may not be valuable. However patient did endorse continued interest. We did draw additional blood on the day of her surgery for Maternal Fetal Contamination in preparation for the testing. Will discuss with [employer] peer to peer and update patient.        Chandra Batch, MD, MPH  Complex Family Planning Fellow  OBGYN  Pager: [phone]  Date: 11/01/2020  Time: 3:24 PM

## 2020-11-05 NOTE — Unmapped (Signed)
[facility] Refill Coordination Note    Specialty Medication(s) to be Shipped:   Inflammatory Disorders: Humira    Other medication(s) to be shipped: No additional medications requested for fill at this time     Stephanie Hester, DOB: 1981/02/25  Phone: [phone] (home)       All above HIPAA information was verified with patient.     Was a Nurse, learning disability used for this call? No    Completed refill call assessment today to schedule patient's medication shipment from the [facility] [phone]).       Specialty medication(s) and dose(s) confirmed: Regimen is correct and unchanged.   Changes to medications: Stephanie Hester reports no changes at this time.  Changes to insurance: No  Questions for the pharmacist: No    Confirmed patient received Welcome Packet with first shipment. The patient will receive a drug information handout for each medication shipped and additional FDA Medication Guides as required.       DISEASE/MEDICATION-SPECIFIC INFORMATION        For patients on injectable medications: Patient currently has 0 doses left.  Next injection is scheduled for 11/09/2020.    SPECIALTY MEDICATION ADHERENCE     Medication Adherence    Patient reported X missed doses in the last month: 0  Specialty Medication: Humira Cf 40 mg/0.4 ml  Patient is on additional specialty medications: No  Any gaps in refill history greater than 2 weeks in the last 3 months: no  Demonstrates understanding of importance of adherence: yes  Informant: patient  Reliability of informant: reliable  Confirmed plan for next specialty medication refill: delivery by pharmacy  Refills needed for supportive medications: not needed                      SHIPPING     Shipping address confirmed in Epic.     Delivery Scheduled: Yes, Expected medication delivery date: 11/07/2020.     Medication will be delivered via Same Day Courier to the prescription address in Epic WAM.    Esli Jernigan D Noriko Macari   [facility] Specialty Technician

## 2020-11-07 MED FILL — HUMIRA PEN CITRATE FREE 40 MG/0.4 ML: SUBCUTANEOUS | 28 days supply | Qty: 2 | Fill #5

## 2020-11-08 LAB — MATERNAL CELL CONTAMINATION ANALYSIS

## 2020-11-13 ENCOUNTER — Encounter
Admit: 2020-11-13 | Discharge: 2020-11-14 | Payer: PRIVATE HEALTH INSURANCE | Attending: Dermatology | Primary: Dermatology

## 2020-11-13 DIAGNOSIS — L409 Psoriasis, unspecified: Principal | ICD-10-CM

## 2020-11-13 NOTE — Unmapped (Signed)
Dermatology Note     Chief Complaint:   Chief Complaint   Patient presents with   ??? Chronic Condition Follow-Up     Has HAD covid vaccine        Info Unavailable Referr*     HPI:   This is a 40 y.o. female who presents to [facility] clinic for follow up of psoraisis.  She has been managing with adalimumab and this has her psoriasis nearly clear. She has small residual activity on the elbows and L knee. Since last visit she lost her pregnancy.  She had COVID as well.  This was a very difficult period for her, but she is doing better.  She and her husband have decided not to pursue further pregnancy after this second pregnancy loss.    No other new, changing, or symptomatic skin lesions of concern are reported today.    Past Medical History, Family History, Social History, Meds, Allergies, Problem List  reviewed in the electronic medical record.     Review of Systems:  Aside from those items mentioned in the HPI, the patient reports no recent fever, no recent chills, no unintended weightloss    Past Medical History:  - 2 pregnancy losses     Family History: sister with dermatomyositis    Social History: See above    Labs: syphilis screen negative, INR normal, very slightly low PT, APTT normal      Medications:  Outpatient Encounter Medications as of 11/13/2020   Medication Sig Dispense Refill   ??? aspirin 81 mg cap Take 81 mg by mouth daily with evening meal.      ??? blood-glucose meter (FREESTYLE INSULINX) Misc Test fasting and one hour after each meal.  Any brand ok. 1 each 0   ??? clobetasoL (TEMOVATE) 0.05 % external solution Apply topically daily to scalp 50 mL 5   ??? empty container Misc Use as directed to dispose of used Humira pens. 1 each 2   ??? glucagon (GLUCAGON) 1 mg SolR Glucagon emergency kit: use only as directed. 1 each 1   ??? halobetasol (ULTRAVATE) 0.05 % ointment Apply topically Two (2) times a day. To psoriasis on the body. 100 g 2   ??? HUMIRA PEN CITRATE FREE 40 MG/0.4 ML Inject the contents of 1 pen (40 mg total) under the skin every fourteen (14) days. 2 each 11   ??? ibuprofen (MOTRIN) 600 MG tablet Take 1 tablet (600 mg total) by mouth every six (6) hours as needed for pain. 60 tablet 2   ??? insulin NPH (HUMULIN N NPH U-100 INSULIN) 100 unit/mL injection Humulin or Novolin NPH vial (brand substitution allowed per insurance preference): take 20 units before breakfast, 10 units at bedtime. (Patient taking differently: Humulin or Novolin NPH vial (brand substitution allowed per insurance preference): take 30 units before breakfast, 20 units at bedtime.) 10 mL 3   ??? insulin syringe-needle U-100 1 mL 31 x 3/8 (10 mm) Syrg Use with insulin as directed twice daily as directed. 100 each 3   ??? levothyroxine (SYNTHROID) 125 MCG tablet Take 137 mcg by mouth daily.      ??? multivitamin, prenatal, folic acid-iron, 27-1 mg Tab Take 1 tablet by mouth daily. 100 tablet 3   ??? promethazine (PHENERGAN) 25 MG tablet Take 1 tablet (25 mg total) by mouth every six (6) hours as needed. 30 tablet 0   ??? clobetasoL (TEMOVATE) 0.05 % ointment Apply twice daily to red, raised rash for 2 weeks 60  g 2   ??? docusate sodium (COLACE) 100 MG capsule Take 1 capsule (100 mg total) by mouth daily as needed for constipation. 30 capsule 1   ??? metFORMIN (GLUMETZA) 500 MG (MOD) 24 hr tablet Take 500 mg by mouth two (2) times a day.     ??? [EXPIRED] oxyCODONE (ROXICODONE) 5 MG immediate release tablet Take 1 tablet (5 mg total) by mouth every eight (8) hours as needed for pain for up to 15 days. 5 tablet 0     No facility-administered encounter medications on file as of 11/13/2020.            Examination:  BP 96/73 (BP Site: L Arm, BP Position: Sitting, BP Cuff Size: Medium)  - Pulse 88  - Temp 36.2 ??C (97.2 ??F) (Temporal)  - Ht 154.9 cm (5' 1)  - Wt 91.8 kg (202 lb 6.4 oz)  - LMP 06/28/2020  - BMI 38.24 kg/m??    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  - continue adalimumab  - if she were to consider further pregnancies, we could discuss use of adalimumab with OBGYN.  For now however, she states that she and her husband have decided not to pursue further pregnancy.

## 2020-11-13 NOTE — Unmapped (Signed)
Continue Humira    You can use your cream medications on any area of rash that you find

## 2020-11-26 DIAGNOSIS — L409 Psoriasis, unspecified: Principal | ICD-10-CM

## 2020-11-26 MED FILL — CLOBETASOL 0.05 % TOPICAL OINTMENT: 14 days supply | Qty: 60 | Fill #1

## 2020-11-26 NOTE — Unmapped (Signed)
Stephanie Hester reports she will lose insurance after today (1/31). In the meantime as she tries to renew Medicaid, I advised we can submit referral to assistance team to help sign up for mfg assist.    She also reports once or twice having some dizziness/weakness for ~ 2 hours after her Humira injections. It doesn't always occur, and it doesn't persist beyond 2 hours. We discussed she should track this and let us know if this worsens or persists.       [facility] Shared [facility] Clinical Assessment & Refill Coordination Note    Stephanie Hester, DOB: 03-19-1981  Phone: [phone] (home)     All above HIPAA information was verified with patient.     Was a Nurse, learning disability used for this call? Yes, spanish. Patient language is appropriate in [facility]    Specialty Medication(s):   Inflammatory Disorders: Humira     Current Outpatient Medications   Medication Sig Dispense Refill   ??? aspirin 81 mg cap Take 81 mg by mouth daily with evening meal.      ??? blood-glucose meter (FREESTYLE INSULINX) Misc Test fasting and one hour after each meal.  Any brand ok. 1 each 0   ??? clobetasoL (TEMOVATE) 0.05 % external solution Apply topically daily to scalp 50 mL 5   ??? clobetasoL (TEMOVATE) 0.05 % ointment Apply twice daily to red, raised rash for 2 weeks 60 g 2   ??? empty container Misc Use as directed to dispose of used Humira pens. 1 each 2   ??? glucagon (GLUCAGON) 1 mg SolR Glucagon emergency kit: use only as directed. 1 each 1   ??? halobetasol (ULTRAVATE) 0.05 % ointment Apply topically Two (2) times a day. To psoriasis on the body. 100 g 2   ??? HUMIRA PEN CITRATE FREE 40 MG/0.4 ML Inject the contents of 1 pen (40 mg total) under the skin every fourteen (14) days. 2 each 11   ??? ibuprofen (MOTRIN) 600 MG tablet Take 1 tablet (600 mg total) by mouth every six (6) hours as needed for pain. 60 tablet 2   ??? insulin NPH (HUMULIN N NPH U-100 INSULIN) 100 unit/mL injection Humulin or Novolin NPH vial (brand substitution allowed per insurance preference): take 20 units before breakfast, 10 units at bedtime. (Patient taking differently: Humulin or Novolin NPH vial (brand substitution allowed per insurance preference): take 30 units before breakfast, 20 units at bedtime.) 10 mL 3   ??? insulin syringe-needle U-100 1 mL 31 x 3/8 (10 mm) Syrg Use with insulin as directed twice daily as directed. 100 each 3   ??? levothyroxine (SYNTHROID) 125 MCG tablet Take 137 mcg by mouth daily.      ??? metFORMIN (GLUMETZA) 500 MG (MOD) 24 hr tablet Take 500 mg by mouth two (2) times a day.     ??? multivitamin, prenatal, folic acid-iron, 27-1 mg Tab Take 1 tablet by mouth daily. 100 tablet 3   ??? promethazine (PHENERGAN) 25 MG tablet Take 1 tablet (25 mg total) by mouth every six (6) hours as needed. 30 tablet 0     No current facility-administered medications for this visit.        Changes to medications: Tamula reports no changes at this time.    No Known Allergies    Changes to allergies: No    SPECIALTY MEDICATION ADHERENCE     Humira  : 9 days of medicine on hand            Specialty  medication(s) dose(s) confirmed: Regimen is correct and unchanged.     Are there any concerns with adherence? No    Adherence counseling provided? Not needed    CLINICAL MANAGEMENT AND INTERVENTION      Clinical Benefit Assessment:    Do you feel the medicine is effective or helping your condition? Yes    Clinical Benefit counseling provided? Not needed    Adverse Effects Assessment:    Are you experiencing any side effects? No    Are you experiencing difficulty administering your medicine? No    Quality of Life Assessment:    How many days over the past month did your psoriasis  keep you from your normal activities? For example, brushing your teeth or getting up in the morning. 0    Have you discussed this with your provider? Not needed    Therapy Appropriateness:    Is therapy appropriate? Yes, therapy is appropriate and should be continued    DISEASE/MEDICATION-SPECIFIC INFORMATION      For patients on injectable medications: Patient currently has 0 doses left.  Next injection is scheduled for 2/11.    PATIENT SPECIFIC NEEDS     - Does the patient have any physical, cognitive, or cultural barriers? No    - Is the patient high risk? No    - Does the patient require a Care Management Plan? No     - Does the patient require physician intervention or other additional services (i.e. nutrition, smoking cessation, social work)? No      SHIPPING     Specialty Medication(s) to be Shipped:   na    Other medication(s) to be shipped: No additional medications requested for fill at this time     Changes to insurance: No    Delivery Scheduled: Patient declined refill at this time due to active insurance ends 1/31.     Medication will be delivered via na to the confirmed na address in [facility].    The patient will receive a drug information handout for each medication shipped and additional FDA Medication Guides as required.  Verified that patient has previously received a Conservation officer, historic buildings.    All of the patient's questions and concerns have been addressed.    [name]   [facility] Specialty Pharmacist

## 2020-11-27 NOTE — Unmapped (Signed)
Patient currently without insurance. Will wait for mfg assist to see if she is eligible. Sending clobetasol today.    Raynor Calcaterra A. Katrinka Blazing, PharmD, BCPS - Pharmacist   [facility]   [address], [place], [place] [address]   t [phone] - f [phone]

## 2020-11-27 NOTE — Unmapped (Incomplete)
CC: Desires sterilization     HPI: Stephanie Hester is a 40 y.o. female G3P1011 who presents for discussion of family planning. Patient presented with desire for sterilization. Her preferred method of sterilization prior to our discussion was  ***.  She is currently using *** for contraception.      We reviewed the various options of contraception -- especially those that do not require surgery and are reversible -- but the patient strongly desires permanent sterilization.     *** All forms of permanent contraception were discussed with the patient, including hysteroscopic methods and female sterilization.     The patient continues to strongly desire ***[facility] sterilization/bilateral salpingectomy     Otherwise, the patient has no complaints today.    Past medical and surgical history reviewed.  Previous abdominal/ pelvic surgery include: ***    Exam: LMP 06/28/2020   Gen: alert and oriented, very pleasant female, NAD   CV: RRR, no m/r/g  Chest: RRR; CTA bilaterally, normal work of breathing  Abd: soft, NT,  *** surgical scars    A/P: Stephanie Hester is a 40 y.o. female G84P1011  who desires surgical sterilization. We discussed both laparoscopic bilateral salpingectomy and tubal ligation. The patient prefers laparoscopic bilateral salpingectomy/***tubal ligation.       *** I explained that there is a potential benefit of reduction of ovarian cancer, with an improvement in contraceptive efficacy of salpingectomy compared with traditional BTL.  I also explained the potential for increased blood loss and other surgical complications. We discussed the potential for reduced ovarian blood flow, with a potential for premature menopause. We also spent time discussing regret.  The patient is certain regarding her decision, and does not desire any future children.  We discussed that the only option for pursuing fertility after bilateral salpingectomy was with IVF, which has significantly more costs than tubal reversal procedures that are done after traditional BTL.     Risks of the procedure discussed with patient including infection, bleeding, damage to surrounding organs, uterine perforation, and risk of failure of approx 1/250 with increased risk of ectopic pregnancy, and the risk of failure.  The patient will accept blood transfusion and does not have a DNR in place. Consent form signed and witnessed.     *** Decision for surgery made today.    ***We spent significant time discussing the increased risk of regret given her age is < 30.      *** Additionally, the patient strongly desires this procedure and would be willing to undergo a BTL even if it meant additional [facility] sites or an open procedure.     *** We discussed that she is at slightly increased risk of complications given her surgical history.     *** Medicaid consent form signed.     2) Pre-operative planning:   -- Pre-op testing: None required, *** UPT on day of OR  -- Contraception until OR: ***  -- Antibiotics: Not required   -- The patient was instructed to hold all NSAIDs and aspirin for 7 days prior to surgery.   -- NPO after midnight on the day before surgery.   -- BMI: There is no height or weight on file to calculate BMI.  -- Likely OR Location: Pt appropriate for [facility]. No anesthesia eval required.   Pt given information on location.   Presumptive surgical date: ***   Pt telephone number: ***    3) Post-operative care   -Emphasized the importance of no heavy  lifting/pushing/pulling greater than 10 lbs, and nothing in vagina for 2 weeks after surgery   - All questions were answered.     Greater than 50% of this 25 min visit was spent in counseling and/or coordination of care.

## 2020-12-04 DIAGNOSIS — L409 Psoriasis, unspecified: Principal | ICD-10-CM

## 2020-12-05 MED ORDER — HUMIRA PEN CITRATE FREE 40 MG/0.4 ML
SUBCUTANEOUS | 11 refills | 28.00000 days | Status: CP
Start: 2020-12-05 — End: ?
  Filled 2020-12-12: qty 2, 28d supply, fill #0

## 2020-12-11 NOTE — Unmapped (Signed)
[facility] Refill Coordination Note    Specialty Medication(s) to be Shipped:   Inflammatory Disorders: Humira    Other medication(s) to be shipped: Halobetasol ointment and Clobetasol external solution     Stephanie Hester, DOB: April 27, 1981  Phone: [phone] (home)       All above HIPAA information was verified with patient.     Was a Nurse, learning disability used for this call? No    Completed refill call assessment today to schedule patient's medication shipment from the [facility] [phone]).       Specialty medication(s) and dose(s) confirmed: Regimen is correct and unchanged.   Changes to medications: Media reports no changes at this time.  Changes to insurance: No  Questions for the pharmacist: No    Confirmed patient received Welcome Packet with first shipment. The patient will receive a drug information handout for each medication shipped and additional FDA Medication Guides as required.       DISEASE/MEDICATION-SPECIFIC INFORMATION        For patients on injectable medications: Patient currently has 0 doses left.  Next injection is scheduled for 12/14/2020.    SPECIALTY MEDICATION ADHERENCE     Medication Adherence    Patient reported X missed doses in the last month: 1  Specialty Medication: Humira Cf 40 mg/0.4 ml   Patient is on additional specialty medications: No  Any gaps in refill history greater than 2 weeks in the last 3 months: no  Demonstrates understanding of importance of adherence: yes  Informant: patient  Reliability of informant: reliable  Confirmed plan for next specialty medication refill: delivery by pharmacy  Refills needed for supportive medications: not needed                      SHIPPING     Shipping address confirmed in Epic.     Delivery Scheduled: Yes, Expected medication delivery date: 12/12/2020.     Medication will be delivered via Same Day Courier to the prescription address in Epic WAM.    [name]   [facility] Shared [facility] Specialty Technician

## 2020-12-12 MED FILL — HALOBETASOL PROPIONATE 0.05 % TOPICAL OINTMENT: TOPICAL | 30 days supply | Qty: 100 | Fill #1

## 2020-12-12 MED FILL — CLOBETASOL 0.05 % SCALP SOLUTION: TOPICAL | 25 days supply | Qty: 50 | Fill #3

## 2020-12-28 NOTE — Unmapped (Signed)
Informed pt to follow-up prescription from [facility]. Phone number provided.

## 2021-01-02 NOTE — Unmapped (Signed)
[facility] Pharm[facility] Refill Coordination Note    Specialty Medication(s) to be Shipped:   Inflammatory Disorders: Humira    Other medication(s) to be shipped: Clobetasol external solution     Stephanie Hester, DOB: 22-Jul-1981  Phone: [phone] (home)       All above HIPAA information was verified with patient.     Was a Nurse, learning disability used for this call? No    Completed refill call assessment today to schedule patient's medication shipment from the [facility] [phone]).       Specialty medication(s) and dose(s) confirmed: Regimen is correct and unchanged.   Changes to medications: Shaliah reports no changes at this time.  Changes to insurance: No  Questions for the pharmacist: No    Confirmed patient received Welcome Packet with first shipment. The patient will receive a drug information handout for each medication shipped and additional FDA Medication Guides as required.       DISEASE/MEDICATION-SPECIFIC INFORMATION        For patients on injectable medications: Patient currently has 0 doses left.  Next injection is scheduled for 01/04/2021.    SPECIALTY MEDICATION ADHERENCE     Medication Adherence    Patient reported X missed doses in the last month: 0  Specialty Medication: Humira Cf 40 mg/0.4 ml  Patient is on additional specialty medications: No  Any gaps in refill history greater than 2 weeks in the last 3 months: no  Demonstrates understanding of importance of adherence: yes  Informant: patient  Reliability of informant: reliable  Confirmed plan for next specialty medication refill: delivery by pharmacy  Refills needed for supportive medications: not needed                      SHIPPING     Shipping address confirmed in Epic.     Delivery Scheduled: Yes, Expected medication delivery date: 01/03/2021.     Medication will be delivered via Same Day Courier to the prescription address in Epic WAM.    Buffey Zabinski D Haley Fuerstenberg   [facility] Shared [facility] Specialty Technician

## 2021-01-03 MED FILL — CLOBETASOL 0.05 % SCALP SOLUTION: TOPICAL | 25 days supply | Qty: 50 | Fill #4

## 2021-01-03 MED FILL — HUMIRA PEN CITRATE FREE 40 MG/0.4 ML: SUBCUTANEOUS | 28 days supply | Qty: 2 | Fill #1

## 2021-01-16 DIAGNOSIS — Z1231 Encounter for screening mammogram for malignant neoplasm of breast: Principal | ICD-10-CM

## 2021-01-24 NOTE — Unmapped (Signed)
[facility] Pharm[facility] Refill Coordination Note    Specialty Medication(s) to be Shipped:   Inflammatory Disorders: Humira    Other medication(s) to be shipped: Halobetasol ointment     Stephanie Hester, DOB: [birth year]  Phone: [phone] (home)       All above HIPAA information was verified with patient.     Was a Nurse, learning disability used for this call? No    Completed refill call assessment today to schedule patient's medication shipment from the [facility] [phone]).       Specialty medication(s) and dose(s) confirmed: Regimen is correct and unchanged.   Changes to medications: Stephanie Hester reports no changes at this time.  Changes to insurance: No  Questions for the pharmacist: No    Confirmed patient received Welcome Packet with first shipment. The patient will receive a drug information handout for each medication shipped and additional FDA Medication Guides as required.       DISEASE/MEDICATION-SPECIFIC INFORMATION        For patients on injectable medications: Patient currently has 0 doses left.  Next injection is scheduled for 02/01/2021.    SPECIALTY MEDICATION ADHERENCE     Medication Adherence    Patient reported X missed doses in the last month: 0  Specialty Medication: Humira Cf 40 mg/0.4 ml   Patient is on additional specialty medications: No  Any gaps in refill history greater than 2 weeks in the last 3 months: no  Demonstrates understanding of importance of adherence: yes  Informant: patient  Reliability of informant: reliable  Confirmed plan for next specialty medication refill: delivery by pharmacy  Refills needed for supportive medications: not needed                      SHIPPING     Shipping address confirmed in Epic.     Delivery Scheduled: Yes, Expected medication delivery date: 01/25/2021.     Medication will be delivered via Same Day Courier to the prescription address in Epic WAM.    Stephanie Hester Stephanie Hester   [facility] Shared [facility] Specialty Technician

## 2021-01-25 MED FILL — HUMIRA PEN CITRATE FREE 40 MG/0.4 ML: SUBCUTANEOUS | 28 days supply | Qty: 2 | Fill #2

## 2021-01-25 MED FILL — HALOBETASOL PROPIONATE 0.05 % TOPICAL OINTMENT: TOPICAL | 30 days supply | Qty: 100 | Fill #2

## 2021-01-29 ENCOUNTER — Encounter: Admit: 2021-01-29 | Discharge: 2021-01-30 | Payer: PRIVATE HEALTH INSURANCE

## 2021-02-04 DIAGNOSIS — R928 Other abnormal and inconclusive findings on diagnostic imaging of breast: Principal | ICD-10-CM

## 2021-02-15 NOTE — Unmapped (Signed)
[facility] Refill Coordination Note    Specialty Medication(s) to be Shipped:   Inflammatory Disorders: Humira    Other medication(s) to be shipped: No additional medications requested for fill at this time     Stephanie Hester, DOB: 18-Mar-1981  Phone: [phone] (home)       All above HIPAA information was verified with patient.     Was a Nurse, learning disability used for this call? No    Completed refill call assessment today to schedule patient's medication shipment from the [facility] [phone]).  All relevant notes have been reviewed.     Specialty medication(s) and dose(s) confirmed: Regimen is correct and unchanged.   Changes to medications: Stephanie Hester reports no changes at this time.  Changes to insurance: No  New side effects reported not previously addressed with a pharmacist or physician: None reported  Questions for the pharmacist: No    Confirmed patient received a Conservation officer, historic buildings and a Surveyor, mining with first shipment. The patient will receive a drug information handout for each medication shipped and additional FDA Medication Guides as required.       DISEASE/MEDICATION-SPECIFIC INFORMATION        For patients on injectable medications: Patient currently has 1 doses left.  Next injection is scheduled for 02/15/2021.    SPECIALTY MEDICATION ADHERENCE     Medication Adherence    Patient reported X missed doses in the last month: 0  Specialty Medication: Humira Cf 40 mg/0.4 ml   Patient is on additional specialty medications: No  Any gaps in refill history greater than 2 weeks in the last 3 months: no  Demonstrates understanding of importance of adherence: yes  Informant: patient  Reliability of informant: reliable  Confirmed plan for next specialty medication refill: delivery by pharmacy  Refills needed for supportive medications: not needed              Were doses missed due to medication being on hold? No    Humira Cf 40 mg/0.4 ml  mg/ml: 14 days of medicine on hand       REFERRAL TO PHARMACIST     Referral to the pharmacist: Not needed      [facility]     Shipping address confirmed in Epic.     Delivery Scheduled: Yes, Expected medication delivery date: 02/22/2021.     Medication will be delivered via Same Day Courier to the prescription address in Epic WAM.    Stephanie Hester D Trueman Worlds   [facility] Shared [facility] Specialty Technician

## 2021-02-20 ENCOUNTER — Ambulatory Visit: Admit: 2021-02-20 | Discharge: 2021-02-21 | Payer: PRIVATE HEALTH INSURANCE

## 2021-02-20 ENCOUNTER — Encounter: Admit: 2021-02-20 | Discharge: 2021-02-21 | Payer: PRIVATE HEALTH INSURANCE

## 2021-02-22 MED FILL — HUMIRA PEN CITRATE FREE 40 MG/0.4 ML: SUBCUTANEOUS | 28 days supply | Qty: 2 | Fill #3

## 2021-03-06 ENCOUNTER — Other Ambulatory Visit: Payer: Self-pay | Admitting: Physician Assistant

## 2021-03-06 DIAGNOSIS — R1032 Left lower quadrant pain: Secondary | ICD-10-CM

## 2021-03-15 DIAGNOSIS — L409 Psoriasis, unspecified: Principal | ICD-10-CM

## 2021-03-15 MED ORDER — HALOBETASOL PROPIONATE 0.05 % TOPICAL OINTMENT
Freq: Two times a day (BID) | TOPICAL | 2 refills | 0.00000 days
Start: 2021-03-15 — End: 2022-03-15

## 2021-03-18 DIAGNOSIS — L409 Psoriasis, unspecified: Principal | ICD-10-CM

## 2021-03-18 MED ORDER — HALOBETASOL PROPIONATE 0.05 % TOPICAL OINTMENT
Freq: Two times a day (BID) | TOPICAL | 2 refills | 0.00000 days | Status: CP
Start: 2021-03-18 — End: 2022-03-18
  Filled 2021-03-22: qty 100, 25d supply, fill #0

## 2021-03-18 NOTE — Unmapped (Signed)
[facility] Special[facility] Pharmacy Refill Coordination Note    Specialty Medication(s) to be Shipped:   Inflammatory Disorders: Humira    Other medication(s) to be shipped: Halobetasol ointment     Stephanie Hester, DOB: 04-Jun-1981  Phone: [phone] (home)       All above HIPAA information was verified with patient.     Was a Nurse, learning disability used for this call? No    Completed refill call assessment today to schedule patient's medication shipment from the [facility] [phone]).  All relevant notes have been reviewed.     Specialty medication(s) and dose(s) confirmed: Regimen is correct and unchanged.   Changes to medications: Stephanie Hester reports no changes at this time.  Changes to insurance: No  New side effects reported not previously addressed with a pharmacist or physician: None reported  Questions for the pharmacist: No    Confirmed patient received a Conservation officer, historic buildings and a Surveyor, mining with first shipment. The patient will receive a drug information handout for each medication shipped and additional FDA Medication Guides as required.       DISEASE/MEDICATION-SPECIFIC INFORMATION        For patients on injectable medications: Patient currently has 0 doses left.  Next injection is scheduled for 03/29/2021.    SPECIALTY MEDICATION ADHERENCE     Medication Adherence    Patient reported X missed doses in the last month: 0  Specialty Medication: Humira Cf 40 mg/0.4 ml  Patient is on additional specialty medications: No  Any gaps in refill history greater than 2 weeks in the last 3 months: no  Demonstrates understanding of importance of adherence: yes  Informant: patient  Reliability of informant: reliable  Confirmed plan for next specialty medication refill: delivery by pharmacy  Refills needed for supportive medications: not needed              Were doses missed due to medication being on hold? No    Humira Cf 40 mg/0.4 ml  mg/ml: 0 days of medicine on hand       REFERRAL TO PHARMACIST     Referral to the pharmacist: Not needed      [facility]     Shipping address confirmed in Epic.     Delivery Scheduled: Yes, Expected medication delivery date: 03/21/2021.     Medication will be delivered via Same Day Courier to the prescription address in Epic WAM.    Stephanie Hester Stephanie Hester   [facility] Shared [facility] Specialty Technician

## 2021-03-21 MED FILL — HUMIRA PEN CITRATE FREE 40 MG/0.4 ML: SUBCUTANEOUS | 28 days supply | Qty: 2 | Fill #4

## 2021-03-28 ENCOUNTER — Ambulatory Visit
Admission: RE | Admit: 2021-03-28 | Discharge: 2021-03-28 | Disposition: A | Discharge status: Home / Self Care | Payer: Medicaid Other | Source: Ambulatory Visit | Attending: Physician Assistant | Admitting: Physician Assistant

## 2021-03-28 ENCOUNTER — Other Ambulatory Visit: Payer: Self-pay | Admitting: Physician Assistant

## 2021-03-28 ENCOUNTER — Other Ambulatory Visit: Payer: Self-pay

## 2021-03-28 DIAGNOSIS — R109 Unspecified abdominal pain: Secondary | ICD-10-CM | POA: Diagnosis present

## 2021-03-28 DIAGNOSIS — R1032 Left lower quadrant pain: Secondary | ICD-10-CM

## 2021-03-28 DIAGNOSIS — G8929 Other chronic pain: Secondary | ICD-10-CM

## 2021-04-03 DIAGNOSIS — R1032 Left lower quadrant pain: Principal | ICD-10-CM

## 2021-04-03 DIAGNOSIS — R109 Unspecified abdominal pain: Principal | ICD-10-CM

## 2021-04-12 NOTE — Unmapped (Signed)
[facility] Special[facility] Pharmacy Refill Coordination Note    Specialty Medication(s) to be Shipped:   Inflammatory Disorders: Humira    Other medication(s) to be shipped: No additional medications requested for fill at this time     Stephanie Hester, DOB: 1981-10-27  Phone: [phone] (home)       All above HIPAA information was verified with patient.     Was a Nurse, learning disability used for this call? No    Completed refill call assessment today to schedule patient's medication shipment from the [facility] [phone]).  All relevant notes have been reviewed.     Specialty medication(s) and dose(s) confirmed: Regimen is correct and unchanged.   Changes to medications: Torrie reports no changes at this time.  Changes to insurance: No  New side effects reported not previously addressed with a pharmacist or physician: None reported  Questions for the pharmacist: No    Confirmed patient received a Conservation officer, historic buildings and a Surveyor, mining with first shipment. The patient will receive a drug information handout for each medication shipped and additional FDA Medication Guides as required.       DISEASE/MEDICATION-SPECIFIC INFORMATION        For patients on injectable medications: Patient currently has 0 doses left.  Next injection is scheduled for 04/26/2021.    SPECIALTY MEDICATION ADHERENCE     Medication Adherence    Patient reported X missed doses in the last month: 0  Specialty Medication: Humira Cf 40 mg/0.4 ml  Patient is on additional specialty medications: No  Any gaps in refill history greater than 2 weeks in the last 3 months: no  Demonstrates understanding of importance of adherence: yes  Informant: patient  Reliability of informant: reliable  Confirmed plan for next specialty medication refill: delivery by pharmacy  Refills needed for supportive medications: not needed              Were doses missed due to medication being on hold? No    Humira Cf 40 mg/0.4 ml  mg/ml: 0 days of medicine on hand       REFERRAL TO PHARMACIST     Referral to the pharmacist: Not needed      [facility]     Shipping address confirmed in Epic.     Delivery Scheduled: Yes, Expected medication delivery date: 04/19/2021.     Medication will be delivered via Same Day Courier to the prescription address in Epic WAM.    Jan Olano D Ajani Schnieders   [facility] Shared [facility] Specialty Technician

## 2021-04-19 MED FILL — HUMIRA PEN CITRATE FREE 40 MG/0.4 ML: SUBCUTANEOUS | 28 days supply | Qty: 2 | Fill #5

## 2021-04-30 ENCOUNTER — Encounter: Admit: 2021-04-30 | Discharge: 2021-05-01 | Payer: PRIVATE HEALTH INSURANCE

## 2021-05-15 NOTE — Unmapped (Signed)
Stephanie Hester reports her psoriasis is still under good control. Her dizziness/weakness that she was having periodically before has stopped. She continues to use topicals PRN, and requests a refill today.    [facility] Clinical Assessment & Refill Coordination Note    Stephanie Hester, DOB: 1981/10/23  Phone: [phone] (home)     All above HIPAA information was verified with patient.     Was a Nurse, learning disability used for this call? No - Patient declined    Specialty Medication(s):   Inflammatory Disorders: Humira     Current Outpatient Medications   Medication Sig Dispense Refill   ??? aspirin 81 mg cap Take 81 mg by mouth daily with evening meal.      ??? blood-glucose meter (FREESTYLE INSULINX) Misc Test fasting and one hour after each meal.  Any brand ok. 1 each 0   ??? clobetasoL (TEMOVATE) 0.05 % external solution Apply topically daily to scalp 50 mL 5   ??? clobetasoL (TEMOVATE) 0.05 % ointment Apply twice daily to red, raised rash for 2 weeks 60 g 2   ??? empty container Misc Use as directed to dispose of used Humira pens. 1 each 2   ??? glucagon (GLUCAGON) 1 mg SolR Glucagon emergency kit: use only as directed. 1 each 1   ??? halobetasol (ULTRAVATE) 0.05 % ointment Apply topically Two (2) times a day to psoriasis on the body. 100 g 2   ??? HUMIRA PEN CITRATE FREE 40 MG/0.4 ML Inject the contents of 1 pen (40 mg total) under the skin every fourteen (14) days. 2 each 11   ??? ibuprofen (MOTRIN) 600 MG tablet Take 1 tablet (600 mg total) by mouth every six (6) hours as needed for pain. 60 tablet 2   ??? insulin NPH (HUMULIN N NPH U-100 INSULIN) 100 unit/mL injection Humulin or Novolin NPH vial (brand substitution allowed per insurance preference): take 20 units before breakfast, 10 units at bedtime. (Patient taking differently: Humulin or Novolin NPH vial (brand substitution allowed per insurance preference): take 30 units before breakfast, 20 units at bedtime.) 10 mL 3   ??? insulin syringe-needle U-100 1 mL 31 x 3/8 (10 mm) Syrg Use with insulin as directed twice daily as directed. 100 each 3   ??? levothyroxine (SYNTHROID) 125 MCG tablet Take 137 mcg by mouth daily.      ??? metFORMIN (GLUMETZA) 500 MG (MOD) 24 hr tablet Take 500 mg by mouth two (2) times a day.     ??? multivitamin, prenatal, folic acid-iron, 27-1 mg Tab Take 1 tablet by mouth daily. 100 tablet 3   ??? promethazine (PHENERGAN) 25 MG tablet Take 1 tablet (25 mg total) by mouth every six (6) hours as needed. 30 tablet 0     No current facility-administered medications for this visit.        Changes to medications: [name] reports no changes at this time.    No Known Allergies    Changes to allergies: No    SPECIALTY MEDICATION ADHERENCE     Humira - 0 left  Medication Adherence    Patient reported X missed doses in the last month: 0  Specialty Medication: Humira          Specialty medication(s) dose(s) confirmed: Regimen is correct and unchanged.     Are there any concerns with adherence? No    Adherence counseling provided? Not needed    CLINICAL MANAGEMENT AND INTERVENTION      Clinical Benefit Assessment:  Do you feel the medicine is effective or helping your condition? Yes    Clinical Benefit counseling provided? Not needed    Adverse Effects Assessment:    Are you experiencing any side effects? No    Are you experiencing difficulty administering your medicine? No    Quality of Life Assessment:    Quality of Life    Rheumatology  On a scale of 1 - 10 with 1 representing not at all and 10 representing completely - how has your rheumatologic condition affected your:  Oncology  Dermatology  1.On a scale of 1-10, how itchy, sore, painful or stinging has your skin condition been within the last week?: 1  2.On a scale of 1-10, how embarrassed or self-conscious have you felt because of your skin within the last week?: 1  3.On a scale of 1-10, how much has your skin affected your social or leisure activities within the last week?: 1          Have you discussed this with your provider? Not needed    Acute Infection Status:    Acute infections noted within Epic:  No active infections  Patient reported infection: None    Therapy Appropriateness:    Is therapy appropriate? Yes, therapy is appropriate and should be continued    DISEASE/MEDICATION-SPECIFIC INFORMATION      For patients on injectable medications: Patient currently has 0 doses left.  Next injection is scheduled for 7/29.    PATIENT SPECIFIC NEEDS     - Does the patient have any physical, cognitive, or cultural barriers? No    - Is the patient high risk? No    - Does the patient require a Care Management Plan? No     - Does the patient require physician intervention or other additional services (i.e. nutrition, smoking cessation, social work)? No      SHIPPING     Specialty Medication(s) to be Shipped:   Inflammatory Disorders: Humira    Other medication(s) to be shipped: clobetasol solution, halobetasol ointment     Changes to insurance: No    Delivery Scheduled: Yes, Expected medication delivery date: Thurs, 7/21.     Medication will be delivered via Same Day Courier to the confirmed prescription address in [facility].    The patient will receive a drug information handout for each medication shipped and additional FDA Medication Guides as required.  Verified that patient has previously received a Conservation officer, historic buildings and a Surveyor, mining.    The patient or caregiver noted above participated in the development of this care plan and knows that they can request review of or adjustments to the care plan at any time.      All of the patient's questions and concerns have been addressed.    [name]   [facility] Specialty Pharmacist

## 2021-05-16 DIAGNOSIS — L409 Psoriasis, unspecified: Principal | ICD-10-CM

## 2021-05-16 MED FILL — HUMIRA PEN CITRATE FREE 40 MG/0.4 ML: SUBCUTANEOUS | 28 days supply | Qty: 2 | Fill #6

## 2021-05-16 MED FILL — CLOBETASOL 0.05 % SCALP SOLUTION: TOPICAL | 25 days supply | Qty: 50 | Fill #5

## 2021-05-16 MED FILL — HALOBETASOL PROPIONATE 0.05 % TOPICAL OINTMENT: TOPICAL | 25 days supply | Qty: 100 | Fill #1

## 2021-05-31 ENCOUNTER — Encounter
Admit: 2021-05-31 | Discharge: 2021-06-01 | Disposition: A | Discharge status: Home / Self Care | Payer: PRIVATE HEALTH INSURANCE

## 2021-05-31 LAB — URINALYSIS
BILIRUBIN UA: NEGATIVE
BLOOD UA: NEGATIVE
GLUCOSE UA: >1000 — AB
LEUKOCYTE ESTERASE UA: NEGATIVE
NITRITE UA: NEGATIVE
PH UA: 5 (ref 5.0–9.0)
PROTEIN UA: NEGATIVE
RBC UA: 1 /HPF (ref <=4)
SPECIFIC GRAVITY UA: 1.036 — ABNORMAL HIGH (ref 1.003–1.030)
SQUAMOUS EPITHELIAL: 4 /HPF (ref 0–5)
UROBILINOGEN UA: <2
WBC UA: 4 /HPF (ref 0–5)

## 2021-05-31 LAB — PREGNANCY, URINE: PREGNANCY TEST URINE: NEGATIVE

## 2021-06-01 LAB — COMPREHENSIVE METABOLIC PANEL
ALBUMIN: 4 g/dL (ref 3.4–5.0)
ALKALINE PHOSPHATASE: 101 U/L (ref 46–116)
ALT (SGPT): 32 U/L (ref 10–49)
ANION GAP: 9 mmol/L (ref 5–14)
AST (SGOT): 24 U/L (ref <=34)
BILIRUBIN TOTAL: 0.3 mg/dL (ref 0.3–1.2)
BLOOD UREA NITROGEN: 12 mg/dL (ref 9–23)
BUN / CREAT RATIO: 20
CALCIUM: 9.7 mg/dL (ref 8.7–10.4)
CHLORIDE: 107 mmol/L (ref 98–107)
CO2: 24 mmol/L (ref 20.0–31.0)
CREATININE: 0.59 mg/dL — ABNORMAL LOW
EGFR CKD-EPI (2021) FEMALE: >90 mL/min/{1.73_m2} (ref >=60)
GLUCOSE RANDOM: 105 mg/dL (ref 70–179)
POTASSIUM: 3.7 mmol/L (ref 3.4–4.8)
PROTEIN TOTAL: 7.6 g/dL (ref 5.7–8.2)
SODIUM: 140 mmol/L (ref 135–145)

## 2021-06-01 LAB — CBC W/ AUTO DIFF
BASOPHILS ABSOLUTE COUNT: 0.1 10*9/L (ref 0.0–0.1)
BASOPHILS RELATIVE PERCENT: 0.7 %
EOSINOPHILS ABSOLUTE COUNT: 0.2 10*9/L (ref 0.0–0.5)
EOSINOPHILS RELATIVE PERCENT: 2 %
HEMATOCRIT: 41.9 % (ref 34.0–44.0)
HEMOGLOBIN: 13.6 g/dL (ref 11.3–14.9)
LYMPHOCYTES ABSOLUTE COUNT: 4.4 10*9/L — ABNORMAL HIGH (ref 1.1–3.6)
LYMPHOCYTES RELATIVE PERCENT: 43.2 %
MEAN CORPUSCULAR HEMOGLOBIN CONC: 32.6 g/dL (ref 32.0–36.0)
MEAN CORPUSCULAR HEMOGLOBIN: 24.8 pg — ABNORMAL LOW (ref 25.9–32.4)
MEAN CORPUSCULAR VOLUME: 76 fL — ABNORMAL LOW (ref 77.6–95.7)
MEAN PLATELET VOLUME: 7.2 fL (ref 6.8–10.7)
MONOCYTES ABSOLUTE COUNT: 0.9 10*9/L — ABNORMAL HIGH (ref 0.3–0.8)
MONOCYTES RELATIVE PERCENT: 8.3 %
NEUTROPHILS ABSOLUTE COUNT: 4.7 10*9/L (ref 1.8–7.8)
NEUTROPHILS RELATIVE PERCENT: 45.8 %
PLATELET COUNT: 371 10*9/L (ref 150–450)
RED BLOOD CELL COUNT: 5.51 10*12/L — ABNORMAL HIGH (ref 3.95–5.13)
RED CELL DISTRIBUTION WIDTH: 17.8 % — ABNORMAL HIGH (ref 12.2–15.2)
WBC ADJUSTED: 10.2 10*9/L (ref 3.6–11.2)

## 2021-06-01 LAB — SLIDE REVIEW

## 2021-06-01 LAB — LIPASE: LIPASE: 51 U/L (ref 12–53)

## 2021-06-01 MED ORDER — SIMETHICONE 125 MG CHEWABLE TABLET
ORAL_TABLET | Freq: Four times a day (QID) | ORAL | 0 refills | 8 days | Status: CP | PRN
Start: 2021-06-01 — End: 2021-06-01

## 2021-06-01 MED ORDER — SIMETHICONE 80 MG CHEWABLE TABLET
ORAL_TABLET | Freq: Four times a day (QID) | ORAL | 0 refills | 8 days | Status: CP | PRN
Start: 2021-06-01 — End: 2021-07-01

## 2021-06-01 MED ADMIN — iohexoL (OMNIPAQUE) 350 mg iodine/mL solution 100 mL: 100 mL | INTRAVENOUS | @ 08:00:00 | Stop: 2021-06-01

## 2021-06-01 MED ADMIN — HYDROcodone-acetaminophen (NORCO) 5-325 mg per tablet 1 tablet: 1 | ORAL | @ 07:00:00 | Stop: 2021-06-01

## 2021-06-01 NOTE — Unmapped (Signed)
Pt here for LLQ pain radiating to left flank, states pain on/off for a year, worse the past 3-4 days. Pt denies GU or GI complaints.

## 2021-06-01 NOTE — Unmapped (Signed)
[facility]  Emergency Department Medical Screening Examination     Subjective     Stephanie Hester is a 40 y.o. female presenting for evaluation of Abdominal Pain. Patient with few years of LLQ pain that acutely worsened over the past 3-4 days.  She received an [place] of ovaries today that was negative.      Abbreviated Review of Systems/Covid Screen  Constitutional: Negative for fever  Respiratory: Negative for cough. Negative for difficulty breathing.    Objective     ED Triage Vitals [05/31/21 1748]   Enc Vitals Group      BP 118/87      Heart Rate 91      SpO2 Pulse       Resp 18      Temp       Temp Source Oral      SpO2 100 %      Focused Physical Exam  Constitutional: No acute distress.  Respiratory: Non-labored respirations.  Neurological: Clear speech. No gross focal neurologic deficits are appreciated.  ?  Assessment & Plan     A medical screening exam has been performed. At the time of this evaluation, no emergency medical condition requiring immediate stabilization has been identified nor is there suspicion for imminent decompensation. Appropriate triage protocols will be implemented and a comprehensive ED evaluation with disposition will be completed by a healthcare provider when an appropriate ED location becomes available. The patient is aware that this is an initial encounter only and verbalizes understanding and agreement with the plan.     Emergency Department operations continue to be impacted by the COVID-19 pandemic.    Documentation assistance was provided by Debby Freiberg Casimiro Needle) Durenda Age, on May 31, 2021 at 17:47 for Forest Gleason, MD.  May 31, 2021 11:48 PM. Documentation assistance provided by the scribe. I was present during the time the encounter was recorded. The information recorded by the scribe was done at my direction and has been reviewed and validated by me.
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
## 2021-06-07 NOTE — Unmapped (Signed)
[facility] Refill Coordination Note    Specialty Medication(s) to be Shipped:   Inflammatory Disorders: Humira    Other medication(s) to be shipped: No additional medications requested for fill at this time     Stephanie Hester, DOB: Feb 22, 1981  Phone: [phone] (home)       All above HIPAA information was verified with patient.     Was a Nurse, learning disability used for this call? No    Completed refill call assessment today to schedule patient's medication shipment from the [facility] [phone]).  All relevant notes have been reviewed.     Specialty medication(s) and dose(s) confirmed: Regimen is correct and unchanged.   Changes to medications: Cyana reports no changes at this time.  Changes to insurance: No  New side effects reported not previously addressed with a pharmacist or physician: None reported  Questions for the pharmacist: No    Confirmed patient received a Conservation officer, historic buildings and a Surveyor, mining with first shipment. The patient will receive a drug information handout for each medication shipped and additional FDA Medication Guides as required.       DISEASE/MEDICATION-SPECIFIC INFORMATION        For patients on injectable medications: Patient currently has 0 doses left.  Next injection is scheduled for 06/21/2021.    SPECIALTY MEDICATION ADHERENCE     Medication Adherence    Patient reported X missed doses in the last month: 0  Specialty Medication: Humira Cf 40 mg/0.4 ml  Patient is on additional specialty medications: No  Any gaps in refill history greater than 2 weeks in the last 3 months: no  Demonstrates understanding of importance of adherence: yes  Informant: patient  Reliability of informant: reliable  Confirmed plan for next specialty medication refill: delivery by pharmacy  Refills needed for supportive medications: not needed              Were doses missed due to medication being on hold? No    Humira Cf 40 mg/0.4 ml  mg/ml: 0 days of medicine on hand       REFERRAL TO PHARMACIST     Referral to the pharmacist: Not needed      [facility]     Shipping address confirmed in Epic.     Delivery Scheduled: Yes, Expected medication delivery date: 06/11/2021.     Medication will be delivered via Same Day Courier to the prescription address in Epic WAM.    Kensie Susman D Adalyn Pennock   [facility] Specialty Technician

## 2021-06-11 MED FILL — HUMIRA PEN CITRATE FREE 40 MG/0.4 ML: SUBCUTANEOUS | 28 days supply | Qty: 2 | Fill #7

## 2021-07-02 NOTE — Unmapped (Incomplete)
Dermatology Note     Assessment and Plan:      Benign Lesions/ Findings:   {derm skin check benign A&P:75424}  - Reassurance provided regarding the benign appearance of lesions noted on exam today; no treatment is indicated in the absence of symptoms/changes.  - Reinforced importance of photoprotective strategies including liberal and frequent sunscreen use of a broad-spectrum SPF 30 or greater, use of protective clothing, and sun avoidance for prevention of cutaneous malignancy and photoaging.  Counseled patient on the importance of regular self-skin monitoring as well as routine clinical skin examinations as scheduled.     Personal history of {derm skin check cancer list:75440} ***  - No evidence of recurrence at this time.  - Discussed maintaining vigilance and counseled on sun protection as above.    There are no diagnoses linked to this encounter.    The patient was advised to call for an appointment should any new, changing, or symptomatic lesions develop.     RTC: No follow-ups on file. or sooner as needed   _________________________________________________________________      Chief Complaint     No chief complaint on file.      HPI     Stephanie Hester is a 40 y.o. female who presents as a {Derm pt type:71410} [facility] for {reason for visit:75513} ***    The patient denies any other new or changing lesions or areas of concern.     Pertinent Past Medical History     {derm PMH :75456}    Problem List    None       ***    Family History:   {derm skin check melanoma history:75452}    Past Medical History, Family History, Social History, Medication List, Allergies, and Problem List were reviewed in the rooming section of Epic.     ROS: Other than symptoms mentioned in the HPI, {derm skin check ros:75408::no fevers, chills, or other skin complaints}    Physical Examination     GENERAL: Well-appearing female in no acute distress, resting comfortably.  {PE systems:75418::NEURO: Alert and oriented, answers questions appropriately}  {PE extent:75514}  {PE list:75421}  ***    {PE limitations:75419::All areas not commented on are within normal limits or unremarkable}      (Approved Template 07/09/2020)

## 2021-07-03 ENCOUNTER — Encounter
Admit: 2021-07-03 | Discharge: 2021-07-04 | Payer: PRIVATE HEALTH INSURANCE | Attending: Dermatology | Primary: Dermatology

## 2021-07-03 DIAGNOSIS — Z79899 Other long term (current) drug therapy: Principal | ICD-10-CM

## 2021-07-03 DIAGNOSIS — L409 Psoriasis, unspecified: Principal | ICD-10-CM

## 2021-07-03 MED ORDER — HALOBETASOL PROPIONATE 0.05 % TOPICAL OINTMENT
Freq: Two times a day (BID) | TOPICAL | 2 refills | 0.00000 days | Status: CP
Start: 2021-07-03 — End: ?

## 2021-07-03 MED ORDER — CLOBETASOL 0.05 % SCALP SOLUTION
Freq: Every day | TOPICAL | 5 refills | 0.00000 days | Status: CP
Start: 2021-07-03 — End: ?

## 2021-07-03 NOTE — Unmapped (Signed)
Dermatology Note     Assessment and Plan     1. Psoriasis vulgaris, <5% BSA  Well-controlled on Humira 40 mg every 2 weeks and topical steroids for breakthrough flares (halobetasol 0.05% ointment to body, clobetasol 0.05% solution to scalp). We will refill topical steroids today. Proper use of topical steroids and risks reviewed, including risk of skin atrophy and hypopigmentation.   - clobetasoL (TEMOVATE) 0.05 % external solution; Apply topically daily. To rash on scalp as needed for rash.  Dispense: 50 mL; Refill: 5  - halobetasol (ULTRAVATE) 0.05 % ointment; Apply topically Two (2) times a day. Onto rash on body for 2 weeks at a time as needed for rash.  Dispense: 100 g; Refill: 2    2. High risk medication use  Patient without systemic symptoms including fever/chills/night sweats/unintentional weight loss, no recent infections. Last TB test negative in 07/2020, will recheck today for monitoring while on Humira.   - Quantiferon TB Gold Plus; Future    The patient was advised to call for an appointment should any new, changing, or symptomatic lesions develop.     RTC: No follow-ups on file. or sooner as needed     Chief Complaint     Chief Complaint   Patient presents with   ??? Psoriasis     Pt here for follow up Psoriasis, skin doing well, no new concerns       HPI     Stephanie Hester is a 40 y.o. female who presents as a returning patient (last seen 11/13/2020) to [facility] for a focused skin exam.    [name] has a history of psoriasis currently controlled with Humira (40 mg every two weeks), topical halobetasol ointment for areas of disease activity on the body, and clobetasol solution for the scalp. She feels satisfied with her current level of control with these therapies. She reports that her current disease activity is limited to the extensor surfaces of bilateral elbows and knees. Denies current scalp activity, though states that she sometimes experiences scalp pruritus without associated rash/scale that resolves with clobetasol solution. She uses the halobetasol 0.05% ointment twice daily for about two weeks when experiencing a psoriasis breakthrough flare and then stops using the medication when her lesions become smooth/less bothersome. No joint pains, fevers, chills, unintentional weight loss. No recent infections or injection site reactions.     The patient denies any other new or changing lesions or areas of concern.     Pertinent Past Medical History     No history of skin cancer    Problem List    None       Family History:   Negative for melanoma    Past Medical History, Family History, Social History, Medication List, Allergies, and Problem List were reviewed in the rooming section of Epic.     ROS: Other than symptoms mentioned in the HPI, no fevers, chills, or other skin complaints.    Physical Examination     GENERAL: Well-appearing female in no acute distress, resting comfortably.  NEURO: Alert and oriented, answers questions appropriately  PSYCH: Normal mood and affect  RESP: No increased work of breathing  SKIN (Focal Skin Exam): Per patient request, examination of bilateral upper and lower extremities was performed  - Well-demarcated erythematous papules and plaques with minimal white scale on bilateral extensor elbows and knees. Minimal post-inflammatory pigment alteration of bilateral knees    All areas not commented on are within normal limits or unremarkable

## 2021-07-03 NOTE — Unmapped (Signed)
Please get labs today     Continue humira and the topical medications     Obtenga laboratorios hoy    Contin??e con humira y los medicamentos t??picos

## 2021-07-04 NOTE — Unmapped (Signed)
[facility] Refill Coordination Note    Specialty Medication(s) to be Shipped:   Inflammatory Disorders: Humira    Other medication(s) to be shipped: No additional medications requested for fill at this time     Stephanie Hester, DOB: 03/21/1981  Phone: [phone] (home)       All above HIPAA information was verified with patient.     Was a Nurse, learning disability used for this call? No    Completed refill call assessment today to schedule patient's medication shipment from the [facility] [phone]).  All relevant notes have been reviewed.     Specialty medication(s) and dose(s) confirmed: Regimen is correct and unchanged.   Changes to medications: Stephanie Hester reports no changes at this time.  Changes to insurance: No  New side effects reported not previously addressed with a pharmacist or physician: None reported  Questions for the pharmacist: No    Confirmed patient received a Conservation officer, historic buildings and a Surveyor, mining with first shipment. The patient will receive a drug information handout for each medication shipped and additional FDA Medication Guides as required.       DISEASE/MEDICATION-SPECIFIC INFORMATION        For patients on injectable medications: Patient currently has 1 doses left.  Next injection is scheduled for 07/05/2021.    SPECIALTY MEDICATION ADHERENCE     Medication Adherence    Patient reported X missed doses in the last month: 0  Specialty Medication: Humira Cf 40 mg/0.4 ml  Patient is on additional specialty medications: No  Any gaps in refill history greater than 2 weeks in the last 3 months: no  Demonstrates understanding of importance of adherence: yes  Informant: patient  Reliability of informant: reliable  Confirmed plan for next specialty medication refill: delivery by pharmacy  Refills needed for supportive medications: not needed              Were doses missed due to medication being on hold? No    Humira Cf 40 mg/0.4 ml  mg/ml: 14 days of medicine on hand       REFERRAL TO PHARMACIST     Referral to the pharmacist: Not needed      [facility]     Shipping address confirmed in Epic.     Delivery Scheduled: Yes, Expected medication delivery date: 07/12/2021.     Medication will be delivered via Same Day Courier to the prescription address in Epic WAM.    Stephanie Hester D Shaunie Boehm   [facility] Specialty Technician

## 2021-07-05 LAB — QUANTIFERON TB GOLD PLUS
QUANTIFERON ANTIGEN 1 MINUS NIL: 0.01 [IU]/mL
QUANTIFERON ANTIGEN 2 MINUS NIL: 0.02 [IU]/mL
QUANTIFERON MITOGEN: 0.17 [IU]/mL
QUANTIFERON TB GOLD PLUS: UNDETERMINED — AB
QUANTIFERON TB NIL VALUE: 0 [IU]/mL

## 2021-07-05 LAB — TB NIL: TB NIL VALUE: 0

## 2021-07-05 LAB — TB AG2: TB AG2 VALUE: 0.02

## 2021-07-05 LAB — TB MITOGEN: TB MITOGEN VALUE: 0.17

## 2021-07-05 LAB — TB AG1: TB AG1 VALUE: 0.01

## 2021-07-12 MED FILL — HUMIRA PEN CITRATE FREE 40 MG/0.4 ML: SUBCUTANEOUS | 28 days supply | Qty: 2 | Fill #8

## 2021-08-01 ENCOUNTER — Encounter: Payer: Self-pay | Admitting: *Deleted

## 2021-08-02 ENCOUNTER — Ambulatory Visit: Payer: Medicaid Other | Admitting: Anesthesiology

## 2021-08-02 ENCOUNTER — Ambulatory Visit
Admission: RE | Admit: 2021-08-02 | Discharge: 2021-08-02 | Disposition: A | Discharge status: Home / Self Care | Payer: Medicaid Other | Attending: Gastroenterology | Admitting: Gastroenterology

## 2021-08-02 ENCOUNTER — Encounter
Admission: RE | Disposition: A | Discharge status: Home / Self Care | Payer: Self-pay | Source: Home / Self Care | Attending: Gastroenterology

## 2021-08-02 ENCOUNTER — Encounter: Payer: Self-pay | Admitting: *Deleted

## 2021-08-02 DIAGNOSIS — D122 Benign neoplasm of ascending colon: Secondary | ICD-10-CM | POA: Diagnosis not present

## 2021-08-02 DIAGNOSIS — R1032 Left lower quadrant pain: Secondary | ICD-10-CM | POA: Insufficient documentation

## 2021-08-02 DIAGNOSIS — Z79890 Hormone replacement therapy (postmenopausal): Secondary | ICD-10-CM | POA: Diagnosis not present

## 2021-08-02 DIAGNOSIS — Z9049 Acquired absence of other specified parts of digestive tract: Secondary | ICD-10-CM | POA: Insufficient documentation

## 2021-08-02 DIAGNOSIS — Z7984 Long term (current) use of oral hypoglycemic drugs: Secondary | ICD-10-CM | POA: Insufficient documentation

## 2021-08-02 HISTORY — DX: Type 2 diabetes mellitus without complications: E11.9

## 2021-08-02 HISTORY — PX: COLONOSCOPY WITH PROPOFOL: SHX5780

## 2021-08-02 HISTORY — DX: Hypothyroidism, unspecified: E03.9

## 2021-08-02 HISTORY — DX: Hyperlipidemia, unspecified: E78.5

## 2021-08-02 LAB — POCT PREGNANCY, URINE: Preg Test, Ur: NEGATIVE

## 2021-08-02 LAB — GLUCOSE, CAPILLARY: Glucose-Capillary: 125 mg/dL — ABNORMAL HIGH (ref 70–99)

## 2021-08-02 SURGERY — COLONOSCOPY WITH PROPOFOL
Anesthesia: General

## 2021-08-02 MED ORDER — PROPOFOL 500 MG/50ML IV EMUL
INTRAVENOUS | Status: DC | PRN
  Administered 2021-08-02: 100 ug/kg/min via INTRAVENOUS

## 2021-08-02 MED ORDER — MIDAZOLAM HCL 2 MG/2ML IJ SOLN
INTRAMUSCULAR | Status: AC
  Filled 2021-08-02: qty 2

## 2021-08-02 MED ORDER — MIDAZOLAM HCL 2 MG/2ML IJ SOLN
INTRAMUSCULAR | Status: DC | PRN
Start: 2021-08-02 — End: 2021-08-02
  Administered 2021-08-02: 2 mg via INTRAVENOUS

## 2021-08-02 MED ORDER — PROPOFOL 10 MG/ML IV BOLUS
INTRAVENOUS | Status: DC | PRN
  Administered 2021-08-02: 50 mg via INTRAVENOUS

## 2021-08-02 MED ORDER — SODIUM CHLORIDE 0.9 % IV SOLN: INTRAVENOUS | Status: DC

## 2021-08-02 MED ORDER — FENTANYL CITRATE (PF) 100 MCG/2ML IJ SOLN
INTRAMUSCULAR | Status: AC
  Filled 2021-08-02: qty 2

## 2021-08-02 MED ORDER — FENTANYL CITRATE (PF) 100 MCG/2ML IJ SOLN
INTRAMUSCULAR | Status: DC | PRN
  Administered 2021-08-02 (×2): 50 ug via INTRAVENOUS

## 2021-08-02 NOTE — Anesthesia Postprocedure Evaluation (Signed)
Anesthesia Post Note  Patient: Caroline Pollard  Procedure(s) Performed: COLONOSCOPY WITH PROPOFOL  Patient location during evaluation: PACU Anesthesia Type: General Level of consciousness: awake and alert, oriented and patient cooperative Pain management: pain level controlled Vital Signs Assessment: post-procedure vital signs reviewed and stable Respiratory status: spontaneous breathing, nonlabored ventilation and respiratory function stable Cardiovascular status: blood pressure returned to baseline and stable Postop Assessment: adequate PO intake Anesthetic complications: no   No notable events documented.   Last Vitals:  Vitals:   08/02/21 0858 08/02/21 0908  BP: 98/65   Pulse:    Resp:    Temp: (!) 36.1 C   SpO2:  100%    Last Pain:  Vitals:   08/02/21 0918  TempSrc:   PainSc: 0-No pain                 Darrin Nipper

## 2021-08-02 NOTE — Transfer of Care (Signed)
Immediate Anesthesia Transfer of Care Note  Patient: Caroline Pollard  Procedure(s) Performed: COLONOSCOPY WITH PROPOFOL  Patient Location: PACU  Anesthesia Type:General  Level of Consciousness: awake  Airway & Oxygen Therapy: Patient Spontanous Breathing  Post-op Assessment: Report given to RN, Post -op Vital signs reviewed and stable and Patient moving all extremities  Post vital signs: Reviewed and stable  Last Vitals:  Vitals Value Taken Time  BP 98/65 08/02/21 0859  Temp 36.1 C 08/02/21 0858  Pulse 97 08/02/21 0859  Resp 18 08/02/21 0859  SpO2 94 % 08/02/21 0859  Vitals shown include unvalidated device data.  Last Pain:  Vitals:   08/02/21 0858  TempSrc: Temporal  PainSc: Asleep         Complications: No notable events documented.

## 2021-08-02 NOTE — Op Note (Signed)
[facility] Patient Name: Caroline Pollard Procedure Date: 08/02/2021 8:10 AM MRN: [id number] Account #: [id number] Date of Birth: 1981/01/16 Admit Type: Outpatient Age: 40 Room: [facility] ENDO ROOM 1 Gender: Female Note Status: Finalized Instrument Name: Jasper Riling [id number] Procedure:             Colonoscopy Indications:           Abdominal pain in the left lower quadrant Providers:             Rueben Bash, DO Referring MD:          Hassan Buckler Medicines:             Monitored Anesthesia Care Complications:         No immediate complications. Estimated blood loss:                         Minimal. Procedure:             Pre-Anesthesia Assessment:                        - Prior to the procedure, a History and Physical was                         performed, and patient medications and allergies were                         reviewed. The patient is competent. The risks and                         benefits of the procedure and the sedation options and                         risks were discussed with the patient. All questions                         were answered and informed consent was obtained.                         Patient identification and proposed procedure were                         verified by the physician, the nurse, the anesthetist                         and the technician in the endoscopy suite. Mental                         Status Examination: alert and oriented. Airway                         Examination: normal oropharyngeal airway and neck                         mobility. Respiratory Examination: clear to                         auscultation. CV Examination: RRR, no murmurs, no S3  or S4. Prophylactic Antibiotics: The patient does not                         require prophylactic antibiotics. Prior                         Anticoagulants: The patient has taken no previous                          anticoagulant or antiplatelet agents. ASA Grade                         Assessment: II - A patient with mild systemic disease.                         After reviewing the risks and benefits, the patient                         was deemed in satisfactory condition to undergo the                         procedure. The anesthesia plan was to use monitored                         anesthesia care (MAC). Immediately prior to                         administration of medications, the patient was                         re-assessed for adequacy to receive sedatives. The                         heart rate, respiratory rate, oxygen saturations,                         blood pressure, adequacy of pulmonary ventilation, and                         response to care were monitored throughout the                         procedure. The physical status of the patient was                         re-assessed after the procedure.                        After obtaining informed consent, the colonoscope was                         passed under direct vision. Throughout the procedure,                         the patient's blood pressure, pulse, and oxygen                         saturations were monitored continuously. The  Colonoscope was introduced through the anus and                         advanced to the the terminal ileum, with                         identification of the appendiceal orifice and IC                         valve. The colonoscopy was performed without                         difficulty. The patient tolerated the procedure well.                         The quality of the bowel preparation was evaluated                         using the BBPS [facility] Bowel Preparation Scale) with                         scores of: Right Colon = 1 (portion of mucosa seen,                         but other areas not well seen due to staining,                         residual  stool and/or opaque liquid), Transverse Colon                         = 2 (minor amount of residual staining, small                         fragments of stool and/or opaque liquid, but mucosa                         seen well) and Left Colon = 3 (entire mucosa seen well                         with no residual staining, small fragments of stool or                         opaque liquid). The total BBPS score equals 6. The                         quality of the bowel preparation was good. The                         terminal ileum, ileocecal valve, appendiceal orifice,                         and rectum were photographed. Findings:      The perianal and digital rectal examinations were normal. Pertinent       negatives include normal sphincter tone.      The terminal ileum appeared normal.      A 6 to 7 mm polyp was found in the ascending  colon. The polyp was       sessile. The polyp was removed with a cold snare. Resection and       retrieval were complete. Estimated blood loss was minimal.      A 1 to 2 mm polyp was found in the rectum. The polyp was sessile. The       polyp was removed with a cold biopsy forceps. Resection and retrieval       were complete. Estimated blood loss was minimal.      Due large amount of residual staining of the right colon, small to       medium sized lesions could not necessarily be ruled out. No large       lesions appreciated.      The exam was otherwise without abnormality on direct and retroflexion       views. Impression:            - The examined portion of the ileum was normal.                        - One 6 to 7 mm polyp in the ascending colon, removed                         with a cold snare. Resected and retrieved.                        - One 1 to 2 mm polyp in the rectum, removed with a                         cold biopsy forceps. Resected and retrieved.                        - The examination was otherwise normal on direct and                          retroflexion views. Recommendation:        - Discharge patient to home.                        - Resume previous diet.                        - Continue present medications.                        - Await pathology results.                        - Repeat colonoscopy for surveillance based on                         pathology results.                        - Return to referring physician as previously                         scheduled.                        - Recommend non-stimulant treatment for constipation  as this is likely cause of the abdominal discomfort. Procedure Code(s):     --- Professional ---                        [phone], Colonoscopy, flexible; with removal of                         tumor(s), polyp(s), or other lesion(s) by snare                         technique                        45380, 59, Colonoscopy, flexible; with biopsy, single                         or multiple Diagnosis Code(s):     --- Professional ---                        K63.5, Polyp of colon                        K62.1, Rectal polyp                        R10.32, Left lower quadrant pain CPT copyright 2019 [employer]. All rights reserved. The codes documented in this report are preliminary and upon coder review may  be revised to meet current compliance requirements. Attending Participation:      I personally performed the entire procedure. Volney American, DO Annamaria Helling DO, DO 08/02/2021 9:02:25 AM This report has been signed electronically. Number of Addenda: 0 Note Initiated On: 08/02/2021 8:10 AM Scope Withdrawal Time: 0 hours 15 minutes 27 seconds  Total Procedure Duration: 0 hours 19 minutes 25 seconds  Estimated Blood Loss:  Estimated blood loss was minimal.      [facility]

## 2021-08-02 NOTE — H&P (Signed)
[facility] Pre-Procedure H&P   Patient ID: Caroline Pollard is a 40 y.o. female.  Gastroenterology Provider: Annamaria Helling, DO  Referring Provider: Laurine Blazer, PA PCP: [facility] [place]  Date: 08/02/2021  HPI Caroline Pollard is a 40 y.o. female who presents today for Colonoscopy for  left lower quadrant pain, bloating.    History is garnered via Marketing executive and chart review.  Seen in office in August. Pt noted LLQ pain started prior to her miscarriage.  She describes the pain as a pulsating and burning pain worse in the evening and does not improve with defecation.  She notes bloating which has improved with medication.  She notes a daily bowel movement with no blood or melena and denies any NSAID use.  She underwent a CT scan in August which was overall negative aside from a moderate colonic stool burden with fecalized stool content in the right lower quadrant.  CMP and CBC lab values are within normal limits.  Denies family history of colorectal cancer and polyps.  No other acute GI complaints.   Past Medical History:  Diagnosis Date   Diabetes mellitus without complication ([place])    Hyperlipidemia    Hypothyroidism     Past Surgical History:  Procedure Laterality Date   APPENDECTOMY     DILATION AND CURETTAGE OF UTERUS      Family History No h/o GI disease or malignancy  Review of Systems  Constitutional:  Negative for activity change, appetite change, fatigue, fever and unexpected weight change.  HENT:  Negative for trouble swallowing and voice change.   Respiratory:  Negative for shortness of breath and wheezing.   Cardiovascular:  Negative for chest pain and palpitations.  Gastrointestinal:  Positive for abdominal pain (LLQ). Negative for abdominal distention, anal bleeding, blood in stool, constipation, diarrhea, nausea, rectal pain and vomiting.       +bloating  Musculoskeletal:  Negative for  arthralgias and myalgias.  Skin:  Negative for color change and pallor.  Neurological:  Negative for dizziness, syncope and weakness.  Psychiatric/Behavioral:  Negative for confusion.   All other systems reviewed and are negative.   Medications No current facility-administered medications on file prior to encounter.   Current Outpatient Medications on File Prior to Encounter  Medication Sig Dispense Refill   Adalimumab (HUMIRA PEN) 40 MG/0.8ML PNKT Inject 40 mg into the skin every 14 (fourteen) days.     empagliflozin (JARDIANCE) 25 MG TABS tablet Take 25 mg by mouth daily.     HYDROcodone-acetaminophen (NORCO) 5-325 MG tablet Take 1 tablet by mouth every 6 (six) hours as needed for moderate pain. 20 tablet 0   Hyoscyamine Sulfate SL 0.125 MG SUBL Place 1 tablet under the tongue every 6 (six) hours as needed (LLQ pain).     levothyroxine (SYNTHROID) 150 MCG tablet Take 150 mcg by mouth daily before breakfast. Take on an empty stomach with a glass of water at least 30-60 minutes before breakfast     metFORMIN (GLUCOPHAGE) 1000 MG tablet Take 1,000 mg by mouth 2 (two) times daily with a meal.     simethicone (MYLICON) 80 MG chewable tablet Chew 80 mg by mouth every 6 (six) hours as needed for flatulence.      Pertinent medications related to GI and procedure were reviewed by me with the patient prior to the procedure   Current Facility-Administered Medications:    0.9 %  sodium chloride infusion, , Intravenous, Continuous, Annamaria Helling, DO  sodium chloride         No Known Allergies Allergies were reviewed by me prior to the procedure  Objective    Vitals:   08/02/21 0717  BP: 99/89  Pulse: 75  Resp: 16  Temp: (!) 97.4 F (36.3 C)  TempSrc: Temporal  SpO2: 100%  Weight: 86.8 kg  Height: 5\' 2"  (1.575 m)    Physical Exam Vitals reviewed.  Constitutional:      General: She is not in acute distress.    Appearance: Normal appearance. She is obese. She is not  ill-appearing, toxic-appearing or diaphoretic.  HENT:     Head: Normocephalic and atraumatic.     Nose: Nose normal.     Mouth/Throat:     Mouth: Mucous membranes are moist.     Pharynx: Oropharynx is clear.  Eyes:     General: No scleral icterus.    Extraocular Movements: Extraocular movements intact.  Cardiovascular:     Rate and Rhythm: Normal rate and regular rhythm.     Heart sounds: Normal heart sounds. No murmur heard.   No friction rub. No gallop.  Pulmonary:     Effort: Pulmonary effort is normal. No respiratory distress.     Breath sounds: Normal breath sounds. No wheezing, rhonchi or rales.  Abdominal:     General: Bowel sounds are normal. There is no distension.     Palpations: Abdomen is soft.     Tenderness: There is no abdominal tenderness. There is no guarding or rebound.  Musculoskeletal:     Cervical back: Neck supple.     Right lower leg: No edema.     Left lower leg: No edema.  Skin:    General: Skin is warm and dry.     Coloration: Skin is not jaundiced or pale.  Neurological:     General: No focal deficit present.     Mental Status: She is alert and oriented to person, place, and time. Mental status is at baseline.  Psychiatric:        Mood and Affect: Mood normal.        Behavior: Behavior normal.        Thought Content: Thought content normal.        Judgment: Judgment normal.     Assessment:  Ms. Caroline Pollard is a 40 y.o. female  who presents today for Colonoscopy for left lower quadrant pain, bloating.  Plan:  Colonoscopy with possible intervention today  Colonoscopy with possible biopsy, control of bleeding, polypectomy, and interventions as necessary has been discussed with the patient/patient representative. Informed consent was obtained from the patient/patient representative after explaining the indication, nature, and risks of the procedure including but not limited to death, bleeding, perforation, missed neoplasm/lesions,  cardiorespiratory compromise, and reaction to medications. Opportunity for questions was given and appropriate answers were provided. Patient/patient representative has verbalized understanding is amenable to undergoing the procedure.   Annamaria Helling, DO  [facility]  Portions of the record may have been created with voice recognition software. Occasional wrong-word or 'sound-a-like' substitutions may have occurred due to the inherent limitations of voice recognition software.  Read the chart carefully and recognize, using context, where substitutions may have occurred.

## 2021-08-02 NOTE — Interval H&P Note (Signed)
History and Physical Interval Note: Preprocedure H&P from 08/02/21  was reviewed and there was no interval change after seeing and examining the patient.  Written consent was obtained from the patient after discussion of risks, benefits, and alternatives via translator. Patient has consented to proceed with Esophagogastroduodenoscopy with possible intervention. She has requested that we speak with her husband after the procedure to let him know the results.   08/02/2021 8:25 AM  Caroline Pollard  has presented today for surgery, with the diagnosis of LLQ pain (R10.32).  The various methods of treatment have been discussed with the patient and family. After consideration of risks, benefits and other options for treatment, the patient has consented to  Procedure(s) with comments: COLONOSCOPY WITH PROPOFOL (N/A) - DM - SPANISH INTERPRETER as a surgical intervention.  The patient's history has been reviewed, patient examined, no change in status, stable for surgery.  I have reviewed the patient's chart and labs.  Questions were answered to the patient's satisfaction.     Annamaria Helling

## 2021-08-02 NOTE — Anesthesia Preprocedure Evaluation (Addendum)
Anesthesia Evaluation  Patient identified by MRN, date of birth, ID band Patient awake    Reviewed: Allergy & Precautions, NPO status , Patient's Chart, lab work & pertinent test results  History of Anesthesia Complications Negative for: history of anesthetic complications  Airway Mallampati: II   Neck ROM: Full    Dental  (+)    Pulmonary neg pulmonary ROS,    Pulmonary exam normal breath sounds clear to auscultation       Cardiovascular Exercise Tolerance: Good negative cardio ROS Normal cardiovascular exam Rhythm:Regular Rate:Normal     Neuro/Psych negative neurological ROS     GI/Hepatic negative GI ROS,   Endo/Other  diabetes, Type 2Hypothyroidism Obesity   Renal/GU negative Renal ROS     Musculoskeletal   Abdominal   Peds  Hematology negative hematology ROS (+)   Anesthesia Other Findings   Reproductive/Obstetrics                            Anesthesia Physical Anesthesia Plan  ASA: 2  Anesthesia Plan: General   Post-op Pain Management:    Induction: Intravenous  PONV Risk Score and Plan: 3 and Propofol infusion, TIVA and Treatment may vary due to age or medical condition  Airway Management Planned: Natural Airway  Additional Equipment:   Intra-op Plan:   Post-operative Plan:   Informed Consent: I have reviewed the patients History and Physical, chart, labs and discussed the procedure including the risks, benefits and alternatives for the proposed anesthesia with the patient or authorized representative who has indicated his/her understanding and acceptance.     Plan/risks discussed with: in [place].  Plan Discussed with: CRNA  Anesthesia Plan Comments:        Anesthesia Quick Evaluation

## 2021-08-05 ENCOUNTER — Encounter: Payer: Self-pay | Admitting: Gastroenterology

## 2021-08-05 DIAGNOSIS — Z79899 Other long term (current) drug therapy: Principal | ICD-10-CM

## 2021-08-05 LAB — SURGICAL PATHOLOGY

## 2021-08-06 NOTE — Unmapped (Signed)
[facility] order

## 2021-08-06 NOTE — Unmapped (Signed)
S/w patient with spanish interpreter - repeat quant gold at [facility] per patient preference. Reviewed importance of repeat testing and she voiced understanding     PCV

## 2021-08-10 LAB — QUANTIFERON TB GOLD PLUS
QUANTIFERON MITOGEN VALUE: >10 [IU]/mL
QUANTIFERON NIL VALUE: 0.02 [IU]/mL
QUANTIFERON TB GOLD: NEGATIVE
QUANTIFERON TB1 AG VALUE: 0.04 [IU]/mL
QUANTIFERON TB2 AG VALUE: 0.06 [IU]/mL

## 2021-08-12 DIAGNOSIS — L409 Psoriasis, unspecified: Principal | ICD-10-CM

## 2021-08-12 DIAGNOSIS — D126 Benign neoplasm of colon, unspecified: Secondary | ICD-10-CM

## 2021-08-12 HISTORY — DX: Benign neoplasm of colon, unspecified: D12.6

## 2021-08-12 MED ORDER — CLOBETASOL 0.05 % SCALP SOLUTION
Freq: Every day | TOPICAL | 5 refills | 0.00000 days | Status: CP
Start: 2021-08-12 — End: ?
  Filled 2021-08-15: qty 50, 30d supply, fill #0

## 2021-08-12 NOTE — Unmapped (Signed)
[facility] Refill Coordination Note    Specialty Medication(s) to be Shipped:   Inflammatory Disorders: Humira    Other medication(s) to be shipped: Clobetasol ext solution     Stephanie Hester, DOB: 02/16/1981  Phone: [phone] (home)       All above HIPAA information was verified with patient.     Was a Nurse, learning disability used for this call? No    Completed refill call assessment today to schedule patient's medication shipment from the [facility] [phone]).  All relevant notes have been reviewed.     Specialty medication(s) and dose(s) confirmed: Regimen is correct and unchanged.   Changes to medications: Stephanie Hester reports no changes at this time.  Changes to insurance: No  New side effects reported not previously addressed with a pharmacist or physician: None reported  Questions for the pharmacist: No    Confirmed patient received a Conservation officer, historic buildings and a Surveyor, mining with first shipment. The patient will receive a drug information handout for each medication shipped and additional FDA Medication Guides as required.       DISEASE/MEDICATION-SPECIFIC INFORMATION        For patients on injectable medications: Patient currently has 0 doses left.  Next injection is scheduled for 08/16/2021.    SPECIALTY MEDICATION ADHERENCE     Medication Adherence    Patient reported X missed doses in the last month: 0  Specialty Medication: Humira Cf 40 mg/0.4 ml  Patient is on additional specialty medications: No  Any gaps in refill history greater than 2 weeks in the last 3 months: no  Demonstrates understanding of importance of adherence: yes  Informant: patient  Reliability of informant: reliable  Confirmed plan for next specialty medication refill: delivery by pharmacy  Refills needed for supportive medications: yes, ordered or provider notified              Were doses missed due to medication being on hold? No    Humira Cf 40 mg/0.4 ml  mg/ml: 0 days of medicine on hand       REFERRAL TO PHARMACIST     Referral to the pharmacist: Not needed      [facility]     Shipping address confirmed in Epic.     Delivery Scheduled: Yes, Expected medication delivery date: 08/13/2021.     Medication will be delivered via Same Day Courier to the prescription address in Epic WAM.    Stephanie Hester   [facility] Specialty Technician

## 2021-08-12 NOTE — Unmapped (Signed)
Requested Prescriptions     Pending Prescriptions Disp Refills   ??? clobetasoL (TEMOVATE) 0.05 % external solution 50 mL 5     Sig: Apply topically daily. To rash on scalp as needed for rash.     Recent Visits  Date Type Provider Dept   07/03/21 Office Visit Priyanka Moody Bruins, MD [facility] [place] [facility]   Showing recent visits within past 540 days with a meds authorizing provider and meeting all other requirements  Future Appointments  No visits were found meeting these conditions.  Showing future appointments within next 150 days with a meds authorizing provider and meeting all other requirements

## 2021-08-13 NOTE — Unmapped (Signed)
Stephanie Hester 's Humira shipment will be delayed as a result of Medicaid does not recognize Dr.Foulke. Need Medicaid provider. Routed msg to Dr.Vedak.     I have reached out to the patient  at [phone] and communicated the delay. We will call the patient back to reschedule the delivery upon resolution. We have not confirmed the new delivery date.

## 2021-08-14 DIAGNOSIS — L409 Psoriasis, unspecified: Principal | ICD-10-CM

## 2021-08-14 MED ORDER — HUMIRA PEN CITRATE FREE 40 MG/0.4 ML
SUBCUTANEOUS | 11 refills | 28.00000 days | Status: CP
Start: 2021-08-14 — End: ?
  Filled 2021-08-15: qty 2, 28d supply, fill #0

## 2021-08-14 NOTE — Unmapped (Signed)
Spoke with patient, who denied interpreter. Humira and clobetasol is now scheduled for a same day courier to prescription address on 10/20. No questions for the pharmacist.

## 2021-09-03 ENCOUNTER — Ambulatory Visit
Admit: 2021-09-03 | Discharge: 2021-09-04 | Payer: PRIVATE HEALTH INSURANCE | Attending: Women's Health | Primary: Women's Health

## 2021-09-03 DIAGNOSIS — N930 Postcoital and contact bleeding: Principal | ICD-10-CM

## 2021-09-03 DIAGNOSIS — G8929 Other chronic pain: Principal | ICD-10-CM

## 2021-09-03 DIAGNOSIS — Z3009 Encounter for other general counseling and advice on contraception: Principal | ICD-10-CM

## 2021-09-03 DIAGNOSIS — R102 Pelvic and perineal pain: Principal | ICD-10-CM

## 2021-09-03 NOTE — Unmapped (Signed)
Spanish interpreter used id number [id number]
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
## 2021-09-04 MED ORDER — FLUCONAZOLE 150 MG TABLET
ORAL_TABLET | ORAL | 0 refills | 0.00000 days | Status: CP
Start: 2021-09-04 — End: ?

## 2021-09-04 MED ORDER — CLINDAMYCIN 100 MG VAGINAL SUPPOSITORY
Freq: Every evening | VAGINAL | 0 refills | 3.00000 days | Status: CP
Start: 2021-09-04 — End: 2021-09-07

## 2021-09-04 NOTE — Unmapped (Signed)
Addended by: Raymondo Band on: 09/04/2021 12:45 PM     Modules accepted: Orders

## 2021-09-04 NOTE — Unmapped (Signed)
[facility] Refill Coordination Note    Specialty Medication(s) to be Shipped:   Inflammatory Disorders: Humira    Other medication(s) to be shipped: No additional medications requested for fill at this time     Stephanie Hester, DOB: 04/07/1981  Phone: [phone] (home)       All above HIPAA information was verified with patient.     Was a Nurse, learning disability used for this call? No    Completed refill call assessment today to schedule patient's medication shipment from the [facility] [phone]).  All relevant notes have been reviewed.     Specialty medication(s) and dose(s) confirmed: Regimen is correct and unchanged.   Changes to medications: Stephanie Hester reports no changes at this time.  Changes to insurance: No  New side effects reported not previously addressed with a pharmacist or physician: None reported  Questions for the pharmacist: No    Confirmed patient received a Conservation officer, historic buildings and a Surveyor, mining with first shipment. The patient will receive a drug information handout for each medication shipped and additional FDA Medication Guides as required.       DISEASE/MEDICATION-SPECIFIC INFORMATION        For patients on injectable medications: Patient currently has 0 doses left.  Next injection is scheduled for 11/18.    SPECIALTY MEDICATION ADHERENCE     Medication Adherence    Patient reported X missed doses in the last month: 0  Specialty Medication: HUMIRA  Patient is on additional specialty medications: No  Patient is on more than two specialty medications: No  Any gaps in refill history greater than 2 weeks in the last 3 months: no  Demonstrates understanding of importance of adherence: yes  Informant: patient              Were doses missed due to medication being on hold? No    Humira 40mg /0.77ml: Patient has 0 days of medication on hand    REFERRAL TO PHARMACIST     Referral to the pharmacist: Not needed      [facility]     Shipping address confirmed in Epic.     Delivery Scheduled: Yes, Expected medication delivery date: 11/16.     Medication will be delivered via Same Day Courier to the prescription address in Epic WAM.    Olga Millers   [facility] Specialty Technician

## 2021-09-11 MED FILL — HUMIRA PEN CITRATE FREE 40 MG/0.4 ML: SUBCUTANEOUS | 28 days supply | Qty: 2 | Fill #1

## 2021-10-07 NOTE — Unmapped (Signed)
[facility] Refill Coordination Note    Specialty Medication(s) to be Shipped:   Inflammatory Disorders: Humira    Other medication(s) to be shipped: Clobetasol ext solution     Stephanie Hester, DOB: 1981/02/26  Phone: [phone] (home)       All above HIPAA information was verified with patient.     Was a Nurse, learning disability used for this call? No    Completed refill call assessment today to schedule patient's medication shipment from the [facility] [phone]).  All relevant notes have been reviewed.     Specialty medication(s) and dose(s) confirmed: Regimen is correct and unchanged.   Changes to medications: Laury reports no changes at this time.  Changes to insurance: No  New side effects reported not previously addressed with a pharmacist or physician: None reported  Questions for the pharmacist: No    Confirmed patient received a Conservation officer, historic buildings and a Surveyor, mining with first shipment. The patient will receive a drug information handout for each medication shipped and additional FDA Medication Guides as required.       DISEASE/MEDICATION-SPECIFIC INFORMATION        For patients on injectable medications: Patient currently has 0 doses left.  Next injection is scheduled for 10/11/2021.    SPECIALTY MEDICATION ADHERENCE     Medication Adherence    Patient reported X missed doses in the last month: 0  Specialty Medication: Humira Cf 40 mg/0.4 ml  Patient is on additional specialty medications: No  Any gaps in refill history greater than 2 weeks in the last 3 months: no  Demonstrates understanding of importance of adherence: yes  Informant: patient  Reliability of informant: reliable  Confirmed plan for next specialty medication refill: delivery by pharmacy  Refills needed for supportive medications: not needed              Were doses missed due to medication being on hold? No    Humira 40mg /0.73ml: Patient has 0 days of medication on hand    REFERRAL TO PHARMACIST     Referral to the pharmacist: Not needed      [facility]     Shipping address confirmed in Epic.     Delivery Scheduled: Yes, Expected medication delivery date: 10/08/2021.     Medication will be delivered via Same Day Courier to the prescription address in Epic WAM.    Stephanie Hester D Marylynn Rigdon   [facility] Shared [facility] Specialty Technician

## 2021-10-08 MED FILL — HUMIRA PEN CITRATE FREE 40 MG/0.4 ML: SUBCUTANEOUS | 28 days supply | Qty: 2 | Fill #2

## 2021-10-08 MED FILL — CLOBETASOL 0.05 % SCALP SOLUTION: TOPICAL | 30 days supply | Qty: 50 | Fill #1

## 2021-10-09 ENCOUNTER — Ambulatory Visit: Admit: 2021-10-09 | Discharge: 2021-10-10 | Payer: PRIVATE HEALTH INSURANCE

## 2021-10-10 NOTE — Unmapped (Signed)
Addended by: Raymondo Band on: 10/10/2021 12:47 PM     Modules accepted: Orders

## 2021-10-31 NOTE — Unmapped (Signed)
[facility] Refill Coordination Note    Specialty Medication(s) to be Shipped:   Inflammatory Disorders: Humira    Other medication(s) to be shipped: No additional medications requested for fill at this time     Stephanie Hester, DOB: August 21, 1981  Phone: [phone] (home)       All above HIPAA information was verified with patient.     Was a Nurse, learning disability used for this call? No    Completed refill call assessment today to schedule patient's medication shipment from the [facility] [phone]).  All relevant notes have been reviewed.     Specialty medication(s) and dose(s) confirmed: Regimen is correct and unchanged.   Changes to medications: Stephanie Hester reports no changes at this time.  Changes to insurance: No  New side effects reported not previously addressed with a pharmacist or physician: None reported  Questions for the pharmacist: No    Confirmed patient received a Conservation officer, historic buildings and a Surveyor, mining with first shipment. The patient will receive a drug information handout for each medication shipped and additional FDA Medication Guides as required.       DISEASE/MEDICATION-SPECIFIC INFORMATION        For patients on injectable medications: Patient currently has 0 doses left.  Next injection is scheduled for 11/08/2021.    SPECIALTY MEDICATION ADHERENCE     Medication Adherence    Patient reported X missed doses in the last month: 0  Specialty Medication: Humira  Patient is on additional specialty medications: No  Any gaps in refill history greater than 2 weeks in the last 3 months: no  Demonstrates understanding of importance of adherence: yes  Informant: patient  Reliability of informant: reliable  Confirmed plan for next specialty medication refill: delivery by pharmacy  Refills needed for supportive medications: not needed              Were doses missed due to medication being on hold? No    Humira 40mg /0.26ml: Patient has 0 days of medication on hand    REFERRAL TO PHARMACIST     Referral to the pharmacist: Not needed      [facility]     Shipping address confirmed in Epic.     Delivery Scheduled: Yes, Expected medication delivery date: 11/01/2021.     Medication will be delivered via Same Day Courier to the prescription address in Epic WAM.    Mann Skaggs D Stephanie Hester   [facility] Shared [facility] Specialty Technician

## 2021-11-01 MED FILL — HUMIRA PEN CITRATE FREE 40 MG/0.4 ML: SUBCUTANEOUS | 28 days supply | Qty: 2 | Fill #3

## 2021-11-22 NOTE — Unmapped (Signed)
[facility] Refill Coordination Note    Specialty Medication(s) to be Shipped:   Inflammatory Disorders: Humira    Other medication(s) to be shipped: Clobetasol ext sol     Stephanie Hester, DOB: 06/20/1981  Phone: [phone] (home)       All above HIPAA information was verified with patient.     Was a Nurse, learning disability used for this call? No    Completed refill call assessment today to schedule patient's medication shipment from the [facility] [phone]).  All relevant notes have been reviewed.     Specialty medication(s) and dose(s) confirmed: Regimen is correct and unchanged.   Changes to medications: Stephanie Hester reports no changes at this time.  Changes to insurance: No  New side effects reported not previously addressed with a pharmacist or physician: None reported  Questions for the pharmacist: No    Confirmed patient received a Conservation officer, historic buildings and a Surveyor, mining with first shipment. The patient will receive a drug information handout for each medication shipped and additional FDA Medication Guides as required.       DISEASE/MEDICATION-SPECIFIC INFORMATION        For patients on injectable medications: Patient currently has 0 doses left.  Next injection is scheduled for 12/06/2021.    SPECIALTY MEDICATION ADHERENCE     Medication Adherence    Patient reported X missed doses in the last month: 0  Specialty Medication: Humira  Patient is on additional specialty medications: No  Any gaps in refill history greater than 2 weeks in the last 3 months: no  Demonstrates understanding of importance of adherence: yes  Informant: patient  Reliability of informant: reliable  Confirmed plan for next specialty medication refill: delivery by pharmacy  Refills needed for supportive medications: not needed              Were doses missed due to medication being on hold? No    Humira 40mg /0.63ml: Patient has 0 days of medication on hand    REFERRAL TO PHARMACIST Referral to the pharmacist: Not needed      [facility]     Shipping address confirmed in Epic.     Delivery Scheduled: Yes, Expected medication delivery date: 11/29/2021.     Medication will be delivered via Same Day Courier to the prescription address in Epic WAM.    [name] D Pearly Apachito   [facility] Shared [facility] Specialty Technician

## 2021-11-29 MED FILL — HUMIRA PEN CITRATE FREE 40 MG/0.4 ML: SUBCUTANEOUS | 28 days supply | Qty: 2 | Fill #4

## 2021-11-29 MED FILL — CLOBETASOL 0.05 % SCALP SOLUTION: TOPICAL | 30 days supply | Qty: 50 | Fill #2

## 2021-12-23 NOTE — Unmapped (Signed)
[facility] Refill Coordination Note    Specialty Medication(s) to be Shipped:   Inflammatory Disorders: Humira    Other medication(s) to be shipped: No additional medications requested for fill at this time     Stephanie Hester, DOB: December 04, 1980  Phone: [phone] (home)       All above HIPAA information was verified with patient.     Was a Nurse, learning disability used for this call? Yes, spanish . Patient language is appropriate in [facility]    Completed refill call assessment today to schedule patient's medication shipment from the [facility] [phone]).  All relevant notes have been reviewed.     Specialty medication(s) and dose(s) confirmed: Regimen is correct and unchanged.   Changes to medications: Stephanie Hester reports no changes at this time.  Changes to insurance: Yes: py does not have active insurance  New side effects reported not previously addressed with a pharmacist or physician: None reported  Questions for the pharmacist: No    Confirmed patient received a Conservation officer, historic buildings and a Surveyor, mining with first shipment. The patient will receive a drug information handout for each medication shipped and additional FDA Medication Guides as required.       DISEASE/MEDICATION-SPECIFIC INFORMATION        For patients on injectable medications: Patient currently has 0 doses left.  Next injection is scheduled for 03/07.    SPECIALTY MEDICATION ADHERENCE     Medication Adherence    Patient reported X missed doses in the last month: 0  Specialty Medication: Humira  Patient is on additional specialty medications: No  Patient is on more than two specialty medications: No  Any gaps in refill history greater than 2 weeks in the last 3 months: no  Demonstrates understanding of importance of adherence: yes  Informant: patient  Reliability of informant: reliable  Provider-estimated medication adherence level: good  Patient is at risk for Non-Adherence: No  Reasons for non-adherence: no problems identified  Confirmed plan for next specialty medication refill: delivery by pharmacy  Refills needed for supportive medications: not needed          Refill Coordination    Has the Patients' Contact Information Changed: No  Is the Shipping Address Different: No         Were doses missed due to medication being on hold? No    Humira 40/0.4 mg/ml: 0 days of medicine on hand         REFERRAL TO PHARMACIST     Referral to the pharmacist: Not needed      [facility]     Shipping address confirmed in Epic.     Delivery Scheduled: Yes, Expected medication delivery date: 03/07.     Medication will be delivered via Same Day Courier to the prescription address in Epic WAM.    [name]   [facility] Specialty Technician

## 2021-12-31 MED FILL — HUMIRA PEN CITRATE FREE 40 MG/0.4 ML: SUBCUTANEOUS | 28 days supply | Qty: 2 | Fill #5

## 2022-01-23 NOTE — Unmapped (Signed)
[facility] Refill Coordination Note    Specialty Medication(s) to be Shipped:   Inflammatory Disorders: Humira    Other medication(s) to be shipped: No additional medications requested for fill at this time     Stephanie Hester, DOB: Mar 20, 1981  Phone: [phone] (home)       All above HIPAA information was verified with patient.     Was a Nurse, learning disability used for this call? No    Completed refill call assessment today to schedule patient's medication shipment from the [facility] [phone]).  All relevant notes have been reviewed.     Specialty medication(s) and dose(s) confirmed: Regimen is correct and unchanged.   Changes to medications: Stephanie Hester reports no changes at this time.  Changes to insurance: No  New side effects reported not previously addressed with a pharmacist or physician: None reported  Questions for the pharmacist: No    Confirmed patient received a Conservation officer, historic buildings and a Surveyor, mining with first shipment. The patient will receive a drug information handout for each medication shipped and additional FDA Medication Guides as required.       DISEASE/MEDICATION-SPECIFIC INFORMATION        For patients on injectable medications: Patient currently has 0 doses left.  Next injection is scheduled for 04/07.    SPECIALTY MEDICATION ADHERENCE     Medication Adherence    Patient reported X missed doses in the last month: 0  Specialty Medication: humira 40mg /0.3ml  Patient is on additional specialty medications: No  Patient is on more than two specialty medications: No  Any gaps in refill history greater than 2 weeks in the last 3 months: no  Demonstrates understanding of importance of adherence: yes  Informant: patient  Reliability of informant: reliable  Provider-estimated medication adherence level: good  Patient is at risk for Non-Adherence: No  Reasons for non-adherence: no problems identified  Confirmed plan for next specialty medication refill: delivery by pharmacy  Refills needed for supportive medications: not needed          Refill Coordination    Has the Patients' Contact Information Changed: No  Is the Shipping Address Different: No         Were doses missed due to medication being on hold? No    Humira 40/0.4 mg/ml: 0 days of medicine on hand         REFERRAL TO PHARMACIST     Referral to the pharmacist: Not needed      [facility]     Shipping address confirmed in Epic.     Delivery Scheduled: Yes, Expected medication delivery date: 04/04.     Medication will be delivered via Same Day Courier to the prescription address in Epic WAM.    Stephanie Hester   [facility] Specialty Technician

## 2022-01-23 NOTE — Unmapped (Signed)
The [facility] has made a second and final attempt to reach this patient to refill the following medication:Humira.      We have left voicemails on the following phone numbers: [phone], have sent a MyChart message and have sent a text message to the following phone numbers: [phone].    Dates contacted: 3/24 3/30  Last scheduled delivery: 3/7    The patient may be at risk of non-compliance with this medication. The patient should call the [facility] at [phone]  Option 4, then Option 2 (all other specialty patients) to refill medication.    Lakeyia Surber D Administrator Shared [facility] Specialty Technician

## 2022-01-28 MED FILL — HUMIRA PEN CITRATE FREE 40 MG/0.4 ML: SUBCUTANEOUS | 28 days supply | Qty: 2 | Fill #6

## 2022-01-30 ENCOUNTER — Ambulatory Visit: Admit: 2022-01-30 | Discharge: 2022-01-31 | Payer: PRIVATE HEALTH INSURANCE

## 2022-01-30 DIAGNOSIS — R102 Pelvic and perineal pain: Principal | ICD-10-CM

## 2022-01-30 DIAGNOSIS — G8929 Other chronic pain: Principal | ICD-10-CM

## 2022-01-30 DIAGNOSIS — M7918 Myalgia, other site: Principal | ICD-10-CM

## 2022-01-30 DIAGNOSIS — R3989 Other symptoms and signs involving the genitourinary system: Principal | ICD-10-CM

## 2022-01-30 MED ORDER — METHOCARBAMOL 500 MG TABLET
ORAL_TABLET | Freq: Every evening | ORAL | 0 refills | 14 days | Status: CP | PRN
Start: 2022-01-30 — End: 2022-02-13

## 2022-01-30 NOTE — Unmapped (Signed)
Thank you for receiving care at [facility]) ! If you have any questions or concerns you have two ways to contact our team:     1) Nurses line: [phone]     2) MyChart messages: These messages are checked by the nurses during normal business hours 8:30 am-4:30 pm Monday-Friday every 24-48 hours and are for non-urgent, non-emergent concerns. You may be asked to return for a follow up visit if it is deemed your questions are best handled in the clinic setting.    Other helpful numbers:  Surgical Scheduler: [id number]  Financial [phone]  Fax: [phone]                All information obtained from: [email or web address]

## 2022-01-30 NOTE — Unmapped (Signed)
Provider: De Burrs, FNP-C   Division: Minimally Invasive Gynecologic Surgery    Date: January 30, 2022  Patient Name: Stephanie Hester  MRN: [id number]  PCP: Tyson Babinski SVC [facility] DREW  Referring Provider: Raymondo Band Geoffery Spruce, [facility]      Assessment and Plan:       41 y.o. female [phone] with clinical history and exam findings consistent with:      ICD-10-CM   1. Myalgia of pelvic floor  M79.18   2. Chronic pelvic pain in female  R10.2    G89.29   3. Abnormal urogenital discharge  R39.89        Orders Placed This Encounter   Procedures    Wet Prep, Genital (Bacterial Vaginitis Screen)    Ambulatory referral to Physical Therapy     Symptoms of LLQ pain are reproduced with carnetts and palpation of the L obturator with severe exacerbation with simultaneous L hip abduction. Will trial short course of muscle relaxer for severe pain and initiate PFPT.     At the end of the visit patient inquired about BTL vs hysterectomy. Would like to talk with surgeon about hysterectomy options. To schedule for surgical consult and schedule follow up with me for continued management of PFTM.     Symptom Exacerbation Plan:  - Increase methocarbamol dosing    Treatment goals:  Treatment goals include complete resolution of pain with medication if necessary.    FUTURE CONSIDERATIONS:  - EMB   - Abdominal wall TPI      Follow Up:  Return in about 2 months (around 04/01/2022).      I have personally reviewed the patient's medical record.     The patient's significant other was not present during this visit and has contributed important medical information as pertinent to patient's medical history.     Comprehensive patient education performed today explaining that the care plan will integrate multimodal activity-based rehabilitation techniques to enhance recovery, reduce pain, and facilitate functionality. Risks, benefits, and instructions for all medications prescribed / treatments offered reviewed with patient, who expresses understanding.      Subjective:     History of Present Illness:   Stephanie Hester is a 41 y.o. female [phone] seen in consultation at the request of Raymondo Band Geoffery Spruce, [facility] for evaluation and recommendations regarding Her chronic LLQ pelvic pain.     PAIN HISTORY:    Date of onset: 2 years  Location: Adominal/Pelvis. chronic LLQ pain.  Reports bladder pressure and pain which is relieved with urination.    Intensity:        01/30/22 0859   PainSc: 2      Frequency: Intermittent  Modifying factors:  intercourse, riding in a car, period, and full bladder  Associated symptoms: aching, burning, and pulsing    Hx of septic abortion in 10/2020 for missed abortion in 09/2020.     This is when the pain started.     Hx of PCB which resolved with treatment of vaginitis.    PMH: GERD, T2DM, HLD, NAFLD, TA, Psoriasis       Current Treatments Previous Treatments   Current Relevant Medications:  Depo Provera  OTCs      Current Physical Therapy:   N/A    Adjunct Treatments:   Activity Modification and Heat/Ice  Heat and Ice will help     In a Pain Clinic:   No Prior Relevant Medications:  Norco 5-325    Prior Physical Therapy: None.  Injections:  None    Prior Relevant Surgeries:   C section   D&C 10/2020.        Pelvic ROS    Bowels: Denies GI Symptoms  No melena, hematochezia or dyschezia.   Bladder: Loss of Urine w. Coughing, Sneeing, Laughing  Sexual intercourse: Pain with deep penetration. Occasional PCB   Sleep: Endorses difficulty falling asleep, Endorses difficulty staying asleep, and Hours of sleep per night 6-7  Mood/Safety: Does feel safe in current living situation    Current Exercise Regimen:  Walking    PAST GYNECOLOGIC HISTORY:     See below     Imaging/Labs:  CT A/P W Con 06/01/2021:       TVUS 10/09/2021:      Obstetric and Gynecologic History History OB History   Gravida Para Term Preterm AB Living   3 1 1   2 1    SAB IAB Ectopic Molar Multiple Live Births   1       0 1      # Outcome Date GA Lbr Len/2nd Weight Sex Delivery Anes PTL Lv   3 AB 09/2020    F    FD   2 SAB 03/2020           1 Term 03/12/18 [redacted]w[redacted]d  2720 g (5 lb 15.9 oz) F CS-LTranv EPI N LIV      Complications: Fetal Intolerance, Postpartum Hemorrhage      Obstetric Comments   GYN HISTORY:    Menarche start: 9   Menstrual cycles: irregular   Last Pap: 08/2021, neg cotest   Abnormal Pap hx: none   STI history: negative   Sex partners: male   Contraceptive method: Depo Provera since 09/2020        Medical History   She  has a past medical history of Diabetes mellitus (CMS-HCC), Disease of thyroid gland, Fatty liver disease, 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     Surgical History   She  has a past surgical history that includes pr cesarean delivery only (N/A, 03/12/2018); Appendectomy (1999); pr surg rx missed abortn,1st tri (N/A, 10/16/2020); and pr surg rx septic abortn (N/A, 10/28/2020).     Allergies   Patient has no known allergies.   Medications     Current Outpatient Medications:     clobetasoL (TEMOVATE) 0.05 % external solution, Apply topically daily. To rash on scalp as needed for rash., Disp: 50 mL, Rfl: 5    clobetasoL (TEMOVATE) 0.05 % ointment, Apply twice daily to red, raised rash for 2 weeks, Disp: 60 g, Rfl: 2    empagliflozin (JARDIANCE) 25 mg tablet, Take 1 tablet (25 mg total) by mouth daily., Disp: , Rfl:     fluconazole (DIFLUCAN) 150 MG tablet, Take 1 tablet by mouth today, take second tablet in 3 days, Disp: 2 tablet, Rfl: 0    halobetasol (ULTRAVATE) 0.05 % ointment, Apply topically Two (2) times a day. Onto rash on body for 2 weeks at a time as needed for rash., Disp: 100 g, Rfl: 2    HUMIRA PEN CITRATE FREE 40 MG/0.4 ML, Inject the contents of 1 pen (40 mg total) under the skin every fourteen (14) days., Disp: 2 each, Rfl: 11    insulin NPH (HUMULIN N NPH U-100 INSULIN) 100 unit/mL injection, Humulin or Novolin NPH vial (brand substitution allowed per insurance preference): take 20 units before breakfast, 10 units at bedtime. (  Patient taking differently: Humulin or Novolin NPH vial (brand substitution allowed per insurance preference): take 30 units before breakfast, 20 units at bedtime.), Disp: 10 mL, Rfl: 3    levothyroxine (SYNTHROID) 125 MCG tablet, Take 137 mcg by mouth daily., Disp: , Rfl:     levothyroxine (SYNTHROID) 175 MCG tablet, Take 1 tablet (175 mcg total) by mouth daily., Disp: , Rfl:     medroxyPROGESTERone (DEPO-PROVERA) 150 mg/mL injection, , Disp: , Rfl:     metFORMIN (GLUMETZA) 500 MG (MOD) 24 hr tablet, Take 1 tablet (500 mg total) by mouth two (2) times a day., Disp: , Rfl:     blood-glucose meter (FREESTYLE INSULINX) Misc, Test fasting and one hour after each meal.  Any brand ok. (Patient not taking: Reported on 01/30/2022), Disp: 1 each, Rfl: 0    empty container Misc, Use as directed to dispose of used Humira pens. (Patient not taking: Reported on 01/30/2022), Disp: 1 each, Rfl: 2    glucagon (GLUCAGON) 1 mg SolR, Glucagon emergency kit: use only as directed. (Patient not taking: Reported on 01/30/2022), Disp: 1 each, Rfl: 1    insulin syringe-needle U-100 1 mL 31 x 3/8 (10 mm) Syrg, Use with insulin as directed twice daily as directed. (Patient not taking: Reported on 01/30/2022), Disp: 100 each, Rfl: 3    methocarbamoL (ROBAXIN) 500 MG tablet, Take 1 tablet (500 mg total) by mouth nightly as needed (muscle spasm) for up to 14 days., Disp: 14 tablet, Rfl: 0    multivitamin, prenatal, folic acid-iron, 27-1 mg Tab, Take 1 tablet by mouth daily. (Patient not taking: Reported on 01/30/2022), Disp: 100 tablet, Rfl: 3    promethazine (PHENERGAN) 25 MG tablet, Take 1 tablet (25 mg total) by mouth every six (6) hours as needed. (Patient not taking: Reported on 01/30/2022), Disp: 30 tablet, Rfl: 0       Family History Her family history includes Dermatomyositis in her sister; Diabetes in her maternal grandmother, mother, and sister; Hypertension in her mother.     Social History She  reports that she has never smoked. She has never used smokeless tobacco. She reports that she does not currently use alcohol. She reports that she does not use drugs.        Occupational History    Not on file           Review of Systems: 10 point ROS completed, negative other than noted in HPI.    Objective:   The primary encounter diagnosis was Myalgia of pelvic floor. Diagnoses of Chronic pelvic pain in female and Abnormal urogenital discharge were also pertinent to this visit.     PHYSICAL EXAM:    BP 105/68  - Pulse 102  - Ht 154.9 cm (5' 1)  - Wt 84.6 kg (186 lb 9.6 oz)  - LMP 01/05/2022 (Exact Date)  - Breastfeeding Unknown  - BMI 35.26 kg/m??          01/30/22 0859   PainSc: 2        Constitutional: Well-developed, well-nourished female in no acute distress  Neuro: Alert and oriented to person, place, and time.  Cardiovascular: HR WNL, no cyanosis, no evidence of edema.  Pulmonary: No work of breathing.   Psychiatric: Mood and affect appropriate.  Skin: No rashes or lesions. No lymphadenopathy.     Gastrointestinal: Soft, nontender, nondistended. No masses or hernias appreciated, no hepatosplenomegaly. No fluid wave. No rebound or guarding.    Genitourinary:      Vulva:  Clitoris, prepuce, labia majora, labia minora, mons pubis normal. No erythema or allodynia.   Urethra: Midline, no masses, nontender.     Bladder: Well-suspended, nontender.    Vagina:  Normally rugated, no lesions.     Cervix: No lesions, normal size and consistency; no cervical motion tenderness    Uterus: 10 week size and contour; smooth, mobile, nontender, anteroverted  Adnexa/Parametria: no masses; no parametrial nodularity  Perineum/Anus: no lesions  Rectal: deferred    Musculoskeletal:     Carnett's sign: Positive    Muscle Right Left Reproduces pain?   Levator - - no   Obturator - ++ yes   Piriformis NT Nt NT     Chaperone present: Cristy Friedlander, RN         E&M Coding:    TIME ATTESTATION     Total Time for E/M Services on the Date of Encounter [phone] - I personally spent 30-39 minutes face-to-face and non-face-to-face in the care of this patient, excluding time spent during separately reported procedures, but including all pre, intra, and post visit time on the date of service.       De Burrs, FNP-C   Family Nurse Practitioner - OB-GYN  Minimally Invasive Gynecologic Surgery    *Patient note was created using Dragon Dictation sotware. Errors in syntax or grammar may not have been identified and edited on initial review.

## 2022-02-14 NOTE — Unmapped (Signed)
[facility] Refill Coordination Note    Specialty Medication(s) to be Shipped:   Inflammatory Disorders: Humira    Other medication(s) to be shipped: No additional medications requested for fill at this time     Stephanie Hester, DOB: 1981-10-07  Phone: [phone] (home)       All above HIPAA information was verified with patient.     Was a Nurse, learning disability used for this call? No    Completed refill call assessment today to schedule patient's medication shipment from the [facility] [phone]).  All relevant notes have been reviewed.     Specialty medication(s) and dose(s) confirmed: Regimen is correct and unchanged.   Changes to medications: Stephanie Hester reports no changes at this time.  Changes to insurance: No  New side effects reported not previously addressed with a pharmacist or physician: None reported  Questions for the pharmacist: No    Confirmed patient received a Conservation officer, historic buildings and a Surveyor, mining with first shipment. The patient will receive a drug information handout for each medication shipped and additional FDA Medication Guides as required.       DISEASE/MEDICATION-SPECIFIC INFORMATION        For patients on injectable medications: Patient currently has 0 doses left.  Next injection is scheduled for 02/28/2022.    SPECIALTY MEDICATION ADHERENCE     Medication Adherence    Patient reported X missed doses in the last month: 0  Specialty Medication: Humira  Patient is on additional specialty medications: No  Any gaps in refill history greater than 2 weeks in the last 3 months: no  Demonstrates understanding of importance of adherence: yes  Informant: patient  Reliability of informant: reliable  Confirmed plan for next specialty medication refill: delivery by pharmacy  Refills needed for supportive medications: not needed              Were doses missed due to medication being on hold? No    Humira 40/0.4 mg/ml: 0 days of medicine on hand REFERRAL TO PHARMACIST     Referral to the pharmacist: Not needed      [facility]     Shipping address confirmed in Epic.     Delivery Scheduled: Yes, Expected medication delivery date: 02/21/2022.     Medication will be delivered via Same Day Courier to the prescription address in Epic WAM.    Stephanie Hester D Leonette Tischer   [facility] Specialty Technician

## 2022-02-21 MED FILL — HUMIRA PEN CITRATE FREE 40 MG/0.4 ML: SUBCUTANEOUS | 28 days supply | Qty: 2 | Fill #7

## 2022-03-21 NOTE — Unmapped (Signed)
[facility] Refill Coordination Note    Specialty Medication(s) to be Shipped:   Inflammatory Disorders: Humira    Other medication(s) to be shipped: No additional medications requested for fill at this time     Stephanie Hester, DOB: [birth year]  Phone: [phone] (home)       All above HIPAA information was verified with patient.     Was a Nurse, learning disability used for this call? No    Completed refill call assessment today to schedule patient's medication shipment from the [facility] [phone]).  All relevant notes have been reviewed.     Specialty medication(s) and dose(s) confirmed: Regimen is correct and unchanged.   Changes to medications: Stephanie Hester reports no changes at this time.  Changes to insurance: No  New side effects reported not previously addressed with a pharmacist or physician: None reported  Questions for the pharmacist: No    Confirmed patient received a Conservation officer, historic buildings and a Surveyor, mining with first shipment. The patient will receive a drug information handout for each medication shipped and additional FDA Medication Guides as required.       DISEASE/MEDICATION-SPECIFIC INFORMATION        For patients on injectable medications: Patient currently has 0 doses left.  Next injection is scheduled for 03/28/2022.    SPECIALTY MEDICATION ADHERENCE     Medication Adherence    Patient reported X missed doses in the last month: 0  Specialty Medication: Humira  Patient is on additional specialty medications: No  Any gaps in refill history greater than 2 weeks in the last 3 months: no  Demonstrates understanding of importance of adherence: yes  Informant: patient  Reliability of informant: reliable  Confirmed plan for next specialty medication refill: delivery by pharmacy  Refills needed for supportive medications: not needed              Were doses missed due to medication being on hold? No    Humira 40/0.4 mg/ml: 0 days of medicine on hand REFERRAL TO PHARMACIST     Referral to the pharmacist: Not needed      [facility]     Shipping address confirmed in Epic.     Delivery Scheduled: Yes, Expected medication delivery date: 03/26/2022.     Medication will be delivered via Same Day Courier to the prescription address in Epic WAM.    Stephanie Hester   [facility] Specialty Technician

## 2022-03-26 MED FILL — HUMIRA PEN CITRATE FREE 40 MG/0.4 ML: SUBCUTANEOUS | 28 days supply | Qty: 2 | Fill #8

## 2022-03-28 ENCOUNTER — Ambulatory Visit: Admit: 2022-03-28 | Discharge: 2022-03-29 | Payer: PRIVATE HEALTH INSURANCE

## 2022-03-28 DIAGNOSIS — K432 Incisional hernia without obstruction or gangrene: Principal | ICD-10-CM

## 2022-03-28 DIAGNOSIS — M7918 Myalgia, other site: Principal | ICD-10-CM

## 2022-03-28 MED ORDER — METHOCARBAMOL 500 MG TABLET
ORAL_TABLET | Freq: Two times a day (BID) | ORAL | 0 refills | 8 days | Status: CP | PRN
Start: 2022-03-28 — End: 2022-04-12

## 2022-03-28 NOTE — Unmapped (Signed)
Provider: De Burrs, FNP-C   Division: Minimally Invasive Gynecologic Surgery    Date: March 28, 2022  Patient Name: Stephanie Hester  MRN: [id number]  PCP: Tyson Babinski SVC [facility]S DREW  Referring Provider: [facility] Svc, Ch*      Assessment and Plan:       41 y.o. female [phone] with clinical history and exam findings consistent with:      ICD-10-CM   1. Myalgia of pelvic floor  M79.18   2. Incisional hernia, without obstruction or gangrene  K43.2          Orders Placed This Encounter   Procedures    US Abdomen Limited    Ambulatory referral to Physical Therapy     Patient with recent travel to [place], reports that she was diagnosed with a LLQ incisional hernia while there. Would like to have  U/S for reassessment.     Did not go to PFPT. Did start muscle relaxer, did not obtain from pharamcy. Re-ordered today for trial period. To contact clinic if it is helpful with symptoms.     Patient continues to be interested in hysterectomy, has planned surgical consult end of July.     Symptom Exacerbation Plan:  - Increase methocarbamol dosing    Treatment goals:  Treatment goals include complete resolution of pain with medication if necessary.    FUTURE CONSIDERATIONS:  - Abdominal wall TPI      Follow Up:  No follow-ups on file. As scheduled.      I have personally reviewed the patient's medical record.     The patient's significant other was not present during this visit and has contributed important medical information as pertinent to patient's medical history.     Comprehensive patient education performed today explaining that the care plan will integrate multimodal activity-based rehabilitation techniques to enhance recovery, reduce pain, and facilitate functionality. Risks, benefits, and instructions for all medications prescribed / treatments offered reviewed with patient, who expresses understanding.      Subjective:     History of Present Illness:   Stephanie Hester is a 41 y.o. female [id number] seen in consultation at the request of [facility] Svc, Ch* for evaluation and recommendations regarding Her chronic LLQ pelvic pain.     03/28/22  Since previous visit Her pain is Unchanged.    Concerns reported during today's visit:   - Concern for hernia, reports that she was diagnosed with an inguinal hernia in [place] with U/S  - Continues to be interested in hysterectomy.     Her pursued the following recommendations since last visit:   Nothing.       Current Treatments Previous Treatments   Current Relevant Medications:  Depo Provera  OTCs      Current Physical Therapy:   N/A    Adjunct Treatments:   Activity Modification and Heat/Ice  Heat and Ice will help     In a Pain Clinic:   No Prior Relevant Medications:  Norco 5-325    Prior Physical Therapy: None.    Injections:  None    Prior Relevant Surgeries:   C section   D&C 10/2020.          PAIN HISTORY:    Date of onset: 2 years  Location: Adominal/Pelvis. chronic LLQ pain.  Reports bladder pressure and pain which is relieved with urination.    Intensity:        03/28/22 1250   PainSc: 5  Frequency: Intermittent  Modifying factors:  intercourse, riding in a car, period, and full bladder  Associated symptoms: aching, burning, and pulsing    Hx of septic abortion in 10/2020 for missed abortion in 09/2020.     This is when the pain started.     Hx of PCB which resolved with treatment of vaginitis.    PMH: GERD, T2DM, HLD, NAFLD, TA, Psoriasis       Pelvic ROS    Bowels: Denies GI Symptoms  No melena, hematochezia or dyschezia.   Bladder: Loss of Urine w. Coughing, Sneeing, Laughing  Sexual intercourse: Pain with deep penetration. Occasional PCB   Sleep: Endorses difficulty falling asleep, Endorses difficulty staying asleep, and Hours of sleep per night 6-7  Mood/Safety: Does feel safe in current living situation    Current Exercise Regimen:  Walking    PAST GYNECOLOGIC HISTORY:     See below     Imaging/Labs:  CT A/P W Con 06/01/2021: TVUS 10/09/2021:      Obstetric and Gynecologic History History OB History   Gravida Para Term Preterm AB Living   3 1 1   2 1    SAB IAB Ectopic Molar Multiple Live Births   1       0 1      # Outcome Date GA Lbr Len/2nd Weight Sex Delivery Anes PTL Lv   3 AB 09/2020    F    FD   2 SAB 03/2020           1 Term 03/12/18 [redacted]w[redacted]d  2720 g (5 lb 15.9 oz) F CS-LTranv EPI N LIV      Complications: Fetal Intolerance, Postpartum Hemorrhage      Obstetric Comments   GYN HISTORY:    Menarche start: 9   Menstrual cycles: irregular   Last Pap: 08/2021, neg cotest   Abnormal Pap hx: none   STI history: negative   Sex partners: male   Contraceptive method: Depo Provera since 09/2020        Medical History   She  has a past medical history of Diabetes mellitus (CMS-HCC), Disease of thyroid gland, Fatty liver disease, 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     Surgical History   She  has a past surgical history that includes pr cesarean delivery only (N/A, 03/12/2018); Appendectomy (1999); pr surg rx missed abortn,1st tri (N/A, 10/16/2020); and pr surg rx septic abortn (N/A, 10/28/2020).     Allergies   Patient has no known allergies.   Medications     Current Outpatient Medications:     clobetasoL (TEMOVATE) 0.05 % external solution, Apply topically daily. To rash on scalp as needed for rash., Disp: 50 mL, Rfl: 5    clobetasoL (TEMOVATE) 0.05 % ointment, Apply twice daily to red, raised rash for 2 weeks, Disp: 60 g, Rfl: 2    empagliflozin (JARDIANCE) 25 mg tablet, Take 1 tablet (25 mg total) by mouth daily., Disp: , Rfl:     fluconazole (DIFLUCAN) 150 MG tablet, Take 1 tablet by mouth today, take second tablet in 3 days, Disp: 2 tablet, Rfl: 0    halobetasol (ULTRAVATE) 0.05 % ointment, Apply topically Two (2) times a day. Onto rash on body for 2 weeks at a time as needed for rash., Disp: 100 g, Rfl: 2    HUMIRA PEN CITRATE FREE 40 MG/0.4 ML, Inject the contents of 1 pen (40 mg total)  under the skin every fourteen (14) days., Disp: 2 each, Rfl: 11    levothyroxine (SYNTHROID) 125 MCG tablet, Take 137 mcg by mouth daily., Disp: , Rfl:     levothyroxine (SYNTHROID) 175 MCG tablet, Take 1 tablet (175 mcg total) by mouth daily., Disp: , Rfl:     medroxyPROGESTERone (DEPO-PROVERA) 150 mg/mL injection, , Disp: , Rfl:     metFORMIN (GLUMETZA) 500 MG (MOD) 24 hr tablet, Take 1 tablet (500 mg total) by mouth two (2) times a day., Disp: , Rfl:     VICTOZA 3-PAK 0.6 mg/0.1 mL (18 mg/3 mL) injection, Inject 0.1 mL (0.6 mg total) under the skin daily., Disp: , Rfl:     blood-glucose meter (FREESTYLE INSULINX) Misc, Test fasting and one hour after each meal.  Any brand ok. (Patient not taking: Reported on 01/30/2022), Disp: 1 each, Rfl: 0    empty container Misc, Use as directed to dispose of used Humira pens. (Patient not taking: Reported on 01/30/2022), Disp: 1 each, Rfl: 2    glucagon (GLUCAGON) 1 mg SolR, Glucagon emergency kit: use only as directed. (Patient not taking: Reported on 01/30/2022), Disp: 1 each, Rfl: 1    insulin NPH (HUMULIN N NPH U-100 INSULIN) 100 unit/mL injection, Humulin or Novolin NPH vial (brand substitution allowed per insurance preference): take 20 units before breakfast, 10 units at bedtime. (Patient not taking: Reported on 03/28/2022), Disp: 10 mL, Rfl: 3    insulin syringe-needle U-100 1 mL 31 x 3/8 (10 mm) Syrg, Use with insulin as directed twice daily as directed. (Patient not taking: Reported on 01/30/2022), Disp: 100 each, Rfl: 3    methocarbamoL (ROBAXIN) 500 MG tablet, Take 2 tablets (1,000 mg total) by mouth two (2) times a day as needed (muscle spasm) for up to 15 days., Disp: 30 tablet, Rfl: 0    multivitamin, prenatal, folic acid-iron, 27-1 mg Tab, Take 1 tablet by mouth daily. (Patient not taking: Reported on 01/30/2022), Disp: 100 tablet, Rfl: 3    promethazine (PHENERGAN) 25 MG tablet, Take 1 tablet (25 mg total) by mouth every six (6) hours as needed. (Patient not taking: Reported on 01/30/2022), Disp: 30 tablet, Rfl: 0       Family History Her family history includes Dermatomyositis in her sister; Diabetes in her maternal grandmother, mother, and sister; Hypertension in her mother.     Social History She  reports that she has never smoked. She has never used smokeless tobacco. She reports that she does not currently use alcohol. She reports that she does not use drugs.        Occupational History    Not on file           Review of Systems: 10 point ROS completed, negative other than noted in HPI.    Objective:   The primary encounter diagnosis was Myalgia of pelvic floor. A diagnosis of Incisional hernia, without obstruction or gangrene was also pertinent to this visit.     PHYSICAL EXAM:    LMP  (LMP Unknown)  - Breastfeeding No          03/28/22 1250   PainSc: 5          Constitutional: Well-developed, well-nourished female in no acute distress  Neuro: Alert and oriented to person, place, and time.  Cardiovascular: HR WNL, no cyanosis, no evidence of edema.  Pulmonary: No work of breathing.   Psychiatric: Mood and affect appropriate.  Skin: No rashes or lesions. No lymphadenopathy.  Exam deferred today, copied forward for reference.   Gastrointestinal: Soft, nontender, nondistended. No masses or hernias appreciated, no hepatosplenomegaly. No fluid wave. No rebound or guarding.    Genitourinary:      Vulva: Clitoris, prepuce, labia majora, labia minora, mons pubis normal. No erythema or allodynia.   Urethra: Midline, no masses, nontender.     Bladder: Well-suspended, nontender.    Vagina:  Normally rugated, no lesions.     Cervix: No lesions, normal size and consistency; no cervical motion tenderness    Uterus: 10 week size and contour; smooth, mobile, nontender, anteroverted  Adnexa/Parametria: no masses; no parametrial nodularity  Perineum/Anus: no lesions  Rectal: deferred    Musculoskeletal:     Carnett's sign: Positive    Muscle Right Left Reproduces pain?   Levator - - no   Obturator - ++ yes   Piriformis NT Nt NT     Chaperone present: Cristy Friedlander, RN         E&M Coding:    TIME ATTESTATION     Total Time for E/M Services on the Date of Encounter [phone] - I personally spent 20-29 minutes face-to-face and non-face-to-face in the care of this patient, excluding time spent during separately reported procedures, but including all pre, intra, and post visit time on the date of service.       De Burrs, FNP-C   Family Nurse Practitioner - OB-GYN  Minimally Invasive Gynecologic Surgery    *Patient note was created using Dragon Dictation sotware. Errors in syntax or grammar may not have been identified and edited on initial review.

## 2022-03-28 NOTE — Unmapped (Signed)
Thank you for receiving care at [facility] ! If you have any questions or concerns you have two ways to contact our team:     1) Nurses line: [phone]     2) MyChart messages: These messages are checked by the nurses during normal business hours 8:30 am-4:30 pm Monday-Friday every 24-48 hours and are for non-urgent, non-emergent concerns. You may be asked to return for a follow up visit if it is deemed your questions are best handled in the clinic setting.    Other helpful numbers:  Surgical Scheduler: [phone]  Financial Counselor:[phone]  Fax: [phone]    Phone number for imaging scheduling:   [phone]

## 2022-04-01 MED ORDER — EMPTY CONTAINER
2 refills | 0 days
Start: 2022-04-01 — End: ?

## 2022-04-04 MED ORDER — EMPTY CONTAINER
2 refills | 0 days
Start: 2022-04-04 — End: ?

## 2022-04-07 MED ORDER — EMPTY CONTAINER
2 refills | 0 days
Start: 2022-04-07 — End: ?

## 2022-04-08 MED ORDER — EMPTY CONTAINER
2 refills | 0 days
Start: 2022-04-08 — End: ?

## 2022-04-08 MED FILL — EMPTY CONTAINER: 120 days supply | Qty: 1 | Fill #0

## 2022-04-16 NOTE — Unmapped (Signed)
[facility] Clinical Assessment & Refill Coordination Note    Stephanie Hester, DOB: [birth year]  Phone: [phone] (home)     All above HIPAA information was verified with patient.     Was a Nurse, learning disability used for this call? No, patient declined    Specialty Medication(s):   Inflammatory Disorders: Humira     Current Outpatient Medications   Medication Sig Dispense Refill    blood-glucose meter (FREESTYLE INSULINX) Misc Test fasting and one hour after each meal.  Any brand ok. (Patient not taking: Reported on 01/30/2022) 1 each 0    clobetasoL (TEMOVATE) 0.05 % external solution Apply topically daily. To rash on scalp as needed for rash. 50 mL 5    clobetasoL (TEMOVATE) 0.05 % ointment Apply twice daily to red, raised rash for 2 weeks 60 g 2    empagliflozin (JARDIANCE) 25 mg tablet Take 1 tablet (25 mg total) by mouth daily.      empty container Misc Use as directed to dispose of used Humira pens. 1 each 2    fluconazole (DIFLUCAN) 150 MG tablet Take 1 tablet by mouth today, take second tablet in 3 days 2 tablet 0    glucagon (GLUCAGON) 1 mg SolR Glucagon emergency kit: use only as directed. (Patient not taking: Reported on 01/30/2022) 1 each 1    halobetasol (ULTRAVATE) 0.05 % ointment Apply topically Two (2) times a day. Onto rash on body for 2 weeks at a time as needed for rash. 100 g 2    HUMIRA PEN CITRATE FREE 40 MG/0.4 ML Inject the contents of 1 pen (40 mg total) under the skin every fourteen (14) days. 2 each 11    insulin NPH (HUMULIN N NPH U-100 INSULIN) 100 unit/mL injection Humulin or Novolin NPH vial (brand substitution allowed per insurance preference): take 20 units before breakfast, 10 units at bedtime. (Patient not taking: Reported on 03/28/2022) 10 mL 3    insulin syringe-needle U-100 1 mL 31 x 3/8 (10 mm) Syrg Use with insulin as directed twice daily as directed. (Patient not taking: Reported on 01/30/2022) 100 each 3    levothyroxine (SYNTHROID) 125 MCG tablet Take 137 mcg by mouth daily.      levothyroxine (SYNTHROID) 175 MCG tablet Take 1 tablet (175 mcg total) by mouth daily.      medroxyPROGESTERone (DEPO-PROVERA) 150 mg/mL injection       metFORMIN (GLUMETZA) 500 MG (MOD) 24 hr tablet Take 1 tablet (500 mg total) by mouth two (2) times a day.      multivitamin, prenatal, folic acid-iron, 27-1 mg Tab Take 1 tablet by mouth daily. (Patient not taking: Reported on 01/30/2022) 100 tablet 3    promethazine (PHENERGAN) 25 MG tablet Take 1 tablet (25 mg total) by mouth every six (6) hours as needed. (Patient not taking: Reported on 01/30/2022) 30 tablet 0    VICTOZA 3-PAK 0.6 mg/0.1 mL (18 mg/3 mL) injection Inject 0.1 mL (0.6 mg total) under the skin daily.       No current facility-administered medications for this visit.        Changes to medications: Vaunda reports no changes at this time.    No Known Allergies    Changes to allergies: No    SPECIALTY MEDICATION ADHERENCE     Humira 40 mg/0.4 ml : 0 days of medicine on hand     Medication Adherence    Patient reported X missed doses in the last month: 0  Specialty  Medication: Humira 40 mg/0.4 ml  Informant: patient          Specialty medication(s) dose(s) confirmed: Regimen is correct and unchanged.     Are there any concerns with adherence? No    Adherence counseling provided? Not needed    CLINICAL MANAGEMENT AND INTERVENTION      Clinical Benefit Assessment:    Do you feel the medicine is effective or helping your condition? Yes    Clinical Benefit counseling provided? Not needed    Adverse Effects Assessment:    Are you experiencing any side effects? No    Are you experiencing difficulty administering your medicine? No    Quality of Life Assessment:    Quality of Life    Rheumatology  Oncology  Dermatology  1. What impact has your specialty medication had on the symptoms of your skin condition (i.e. itchiness, soreness, stinging)?: Tremendous  2. What impact has your specialty medication had on your comfort level with your skin?: Tremendous  Cystic Fibrosis          How many days over the past month did your psoriasis  keep you from your normal activities? For example, brushing your teeth or getting up in the morning. 0    Have you discussed this with your provider? Not needed    Acute Infection Status:    Acute infections noted within Epic:  No active infections  Patient reported infection: None    Therapy Appropriateness:    Is therapy appropriate and patient progressing towards therapeutic goals? Yes, therapy is appropriate and should be continued    DISEASE/MEDICATION-SPECIFIC INFORMATION      For patients on injectable medications: Patient currently has 0 doses left.  Next injection is scheduled for 6/30.    PATIENT SPECIFIC NEEDS     Does the patient have any physical, cognitive, or cultural barriers? No    Is the patient high risk? No    Does the patient require a Care Management Plan? No     SOCIAL DETERMINANTS OF HEALTH     At the [facility], we have learned that life circumstances - like trouble affording food, housing, utilities, or transportation can affect the health of many of our patients.   That is why we wanted to ask: are you currently experiencing any life circumstances that are negatively impacting your health and/or quality of life? No    Social Determinants of Psychologist, prison and probation services Strain: Not on file   Internet Connectivity: Not on file   Food Insecurity: Not on file   Tobacco Use: Low Risk     Smoking Tobacco Use: Never    Smokeless Tobacco Use: Never    Passive Exposure: Not on file   Housing/Utilities: Unknown    Within the past 12 months, have you ever stayed: outside, in a car, in a tent, in an overnight shelter, or temporarily in someone else's home (i.e. couch-surfing)?: No    Are you worried about losing your housing?: Not on file    Within the past 12 months, have you been unable to get utilities (heat, electricity) when it was really needed?: Not on file   Alcohol Use: Not on file   Transportation Needs: Not on file   Substance Use: Not on file   Health Literacy: Medium Risk    : Rarely   Physical Activity: Not on file   Interpersonal Safety: Not on file   Stress: Not on file   Intimate Partner Violence:  Not on file   Depression: Not on file   Social Connections: Not on file       Would you be willing to receive help with any of the needs that you have identified today? Not applicable       SHIPPING     Specialty Medication(s) to be Shipped:   Inflammatory Disorders: Humira    Other medication(s) to be shipped: No additional medications requested for fill at this time     Changes to insurance: No    Delivery Scheduled: Yes, Expected medication delivery date: 6/23.     Medication will be delivered via Same Day Courier to the confirmed prescription address in [facility].    The patient will receive a drug information handout for each medication shipped and additional FDA Medication Guides as required.  Verified that patient has previously received a Conservation officer, historic buildings and a Surveyor, mining.    The patient or caregiver noted above participated in the development of this care plan and knows that they can request review of or adjustments to the care plan at any time.      All of the patient's questions and concerns have been addressed.    Awa Bachicha Glade Nurse  [facility] Specialty Pharmacist

## 2022-04-18 DIAGNOSIS — L409 Psoriasis, unspecified: Principal | ICD-10-CM

## 2022-04-18 MED FILL — HUMIRA PEN CITRATE FREE 40 MG/0.4 ML: SUBCUTANEOUS | 28 days supply | Qty: 2 | Fill #9

## 2022-04-25 ENCOUNTER — Ambulatory Visit: Admit: 2022-04-25 | Discharge: 2022-04-26 | Payer: PRIVATE HEALTH INSURANCE

## 2022-04-30 ENCOUNTER — Ambulatory Visit: Admit: 2022-04-30 | Payer: PRIVATE HEALTH INSURANCE

## 2022-04-30 ENCOUNTER — Ambulatory Visit: Admit: 2022-04-30 | Discharge: 2022-05-29 | Payer: PRIVATE HEALTH INSURANCE

## 2022-04-30 NOTE — Unmapped (Signed)
[facility] [address]   OUTPATIENT PHYSICAL THERAPY  04/30/2022  Note Type: Evaluation       Patient Name: Stephanie Hester  Date of Birth:1981-10-05  Diagnosis:   Encounter Diagnoses   Name Primary?    Myalgia of pelvic floor Yes    Scar adherent      Referring MD:  Stasia Cavalier*     Date of Onset of Impairment-05/01/2019  Date PT Care Plan Established or Reviewed-04/30/2022  Date PT Treatment Started-04/30/2022   Plan of Care Effective Date: 04/30/22 - 07/23/22    Assessment/Plan:    Assessment  Assessment details:    Patient is a 41 y.o.  female who presents to pelvic floor physical therapy for evaluation with c/o of left lower quadrant pelvic pain that has been ongoing and constant for over 3 years. Pt reports pain started following a miscarriage several years ago and pain is aggravated with holding urge to urinate; with elevated gas/defecation occasionally; bending; and lifting. Patient demonstrates nocturia , elevated urinary frequency, overactive pelvic floor muscles, decreased pelvic floor strength, decreased core strength, and poor coordination of muscular control. Pt with concordant pain with palpation along cesarean scar and L obturator internus. Further assessment of the lumbopelvic-hip complex will be necessary at a future visit. Pt will benefit from course of skilled physical therapy with a focus on pelvic floor muscle coordination training, improving strength, and range of motion, manual therapy techniques, and bowel and bladder education. Pt will also benefit from progression of core and lower extremity strength to optimize control of bladder, bowel, and sexual function and support the lumbopelvic girdle. Moderate level complexity evaluation due to 1-2 personal factors and/or comorbidities, 3 or more body system elements addressed, and evolving clinical presentation.     Therapist wore a mask for the entire session.      All information regarding the expectations of participation in pelvic floor physical therapy, including but not limited to, the need for internal and/or external pelvic floor assessment, treatment techniques and plan of care were thoroughly discussed.  Patient confirms understanding that (s)he may bring a third party to any or all physical therapy sessions and can withdraw consent at any point throughout the plan of care.  Patient and guardian (if applicable) verbalize understanding of all above information and consent to pelvic floor assessment and treatment at evaluation and all future physical therapy sessions.     I reviewed the no-show/attendance policy with the patient and caregiver(s). The patient and/or family is aware that they must call to cancel appointments more than 24 hours in advance. They are also aware that if they late cancel (less than 24 hours from appointment), arrives greater than 15 minutes late, or no-show three times, we reserve the right to cancel their remaining appointments. This policy is in place to allow [place] to best serve the needs of our caseload.      If patient returns to clinic with variance in plan of care, then it may be attributable to one or more of the following factors: preferred clinician availability, appointment time request availability, therapy pool appointment availability, major holiday with clinic closure, caregiver availability, patient transportation, conflicting medical appointment, inclement weather, and/or patient illness.    If patient does not return for follow up visit(s) related to this episode of care, this note will serve as their discharge note from Physical Therapy.            Impairments: bowel dysfunction, core weakness, decreased endurance,  decreased knowledge of self-care, decreased strength, impaired coordination, impaired motor control, impaired tone, poor awareness of body mechanics, poor bowel/bladder habits, trigger points, urinary dysfunction, hypertonicity and poor self regulation Personal Factors/Comorbidities: 3+    Specific Comorbidities: According to chart review, GERD, T2DM, HLD, TA, psoriasis    Examination of Body Systems: musculoskeletal, integumentary, activity/participation and neurological    Clinical Presentation: evolving    Clinical Decision Making: moderate    Prognosis: fair prognosis    Positive Prognosis Rationale: motivated for treatment.  Negative Prognosis Rationale: insight, financial status, Pain Status and severity of symptoms.    Barriers to therapy: financial    Therapy Goals      Goals:      In 6 weeks:     Patient will verbalize understanding of and need for consistency with behavioral changes such as but not limited to appropriate hydration, potential changes to diet, use of squatty potty for voiding, routine diaphragmatic breathing, relaxation exercises, mindfulness activities.    In 12 weeks:     Pt will demonstrate reduction in intrapelvic, abdominal, and other pelvic fascial restrictions with resolved tenderness to palpation during pelvic examination and return to normal resting tone with minimal to no evidence of hypertonicity.     Pt will be able to perform activities of daily living/work related responsibilities including but not limited to child care activities and bending to clean with minimal (pain <2/10) to no pain provocation or limitations.     Pt will be able to urinate and defecate with no pain provocation prior to, during, or following voiding.     Pt to improve urinary frequency to every 2-4 hours during daytime and </=1x/night for reduced life disruption from excessive urination.       Plan    Therapy options: will be seen for skilled physical therapy services    Planned therapy interventions: Bladder Retraining, Bowel Retraining, Diaphragmatic/Pursed-lip Breathing, E-Stim, Education - Patient, Education - Geophysicist/field seismologist, Endurance Activites, Functional Mobility, Home Exercise Program, Therapeutic Exercises, Therapeutic Activities, TENS, Self-Care/Home Training, Postural Training, Neuromuscular Re-education, Manual Therapy, Cryotherapy and Taping    DME Equipment: squatty potty.    Frequency: 1x week (1x/week for 12 weeks with reducing frequency as improvements are made)    Duration in weeks: 12 weeks    Education provided to: patient.    Education provided: Anatomy, Importance of Therapy, Treatment options and plan and Role of therapy in Rehabilitation    Education results: needs reinforcement.    Communication/Consultation: Initial note sent to Referring Provider.    Next visit plan:          Abdominal MFR  Edu urge suppression techniques/healthy bladder habits   Abdominal/scar stretches       Treatment rendered today:        Moderate Complexity Evaluation x 40 min  _______________________________________________________________________________________________  Therapeutic Exercise x 2 min    Butterfly stretch x 1 min with diaphragmatic breathing.   ______________________________________________________________________________________________  Self Care x 3 min    Pt taught how to perform cesarean scar mobilization to reduce pain.   _______________________________________________________________________________________________  Total Treatment Time; 45 min    Subjective:   History of Present Condition      History of Present Condition/Chief Complaint:       H/o of pelvic pain     Date of Onset:  05/01/2019  Date is an approximation?: yes  Explanation:  Pt states pain started approx 3 years ago following a miscarriage   Subjective:     Use of  interpreter services via telephone to enhance communication.     ID# [id number]     Evaluation April 30, 2022: Pt reports chronic history of pelvic pain for approximately 3 years in low belly region on the left. Pain started after a miscarriage approximately 3 years ago according to the patient. According to chart review, pt had a septic abortion in 2022 with pain following. Pain is aggravated with gas/defecation, holding in urine for too long, lifting, and bending. Pain is distractible during day but when she goes to sleep she finds it difficult to find a comfortable position. She was told when she was in [place] that she has a hernia but most recent imaging did not find evidence of a hernia. 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   Quality of life: good    Pain  Current pain rating: 3  At best pain rating: 3  At worst pain rating: 9  Location: left lower quadrant  Quality: sharp, stabbing and constant  Relieving factors: rest  Aggravating factors: lifting and bending (defecation/gas)  Pain Related Behaviors: none  Progression: no change  Red flags: disturbed sleep.    Precautions and Equipment  Precautions: None  Current Braces/Orthoses: None  Equipment Currently Used: None  Prior Functional Status:     Functional Limitation(s)-No physical limitations  Current functional status: limited lifting and limited walking tolerance  Social Support  Lives in: two story house  Lives with: spouse (daughter - 17 years old)    Communication Preference: verbal, written and visual  Barriers to Learning: No Barriers  Work/School: Pt works as a Engineer, water for offices    Diagnostic Tests    Diagnostic Test Comments:       Reviewed in epic.     Treatments  Previous treatment: medication (pain medication, muscle relaxers)      Patient Goals  Patient goals for therapy: decreased pain and improved sleep (improve ability to play/take care of her 43 yo daughter)    Objective:   PELVIC EVAL   History of pregnancy?: Yes    Pregnancy type:  Gravida, Para and C-Section(s)  # of Gravida:  3  # of Para:  1  # of C-Section(s):  1  Sexual dysfunction:  Yes  Sexual dysfunction types:  Other (see comments) (pain wtih occasional pain during intimacy when she had a UTI)  # of voids:  Per hour and 1  # of voids per night:  2 and per night  Urinary urgency?: Yes    Urinary incontinence?: No    Pain with urination/bladder filling?: Yes (pain prior to urinate)    # of BMs:  3, 4 and per day  Fecal urgency?: No    Fecal incontinence?: No    Straining?: No    Stool Type (based on [facility] Stool Chart):  VI  Pain with defecation/rectal filling: No      The patient provided verbal consent for internal assessment and treatment, verbalized understanding of why the patient is participating in intrapelvic examination and/or treatment. The patient was given a choice between external assessment/treatment and chose an internal vaginal exam. The patient gave verbal permission, just prior to the examination for the internal examination/treatment. The patient expressed understanding that they could stop the exam/treatment at any time.       OBJECTIVE      Internal Sensory:    Internal Vaginal Vault: Normal    Palpation:    Tenderness to Palpation (TTP): lower abdomen in  LLQ, rebound - , along lateral border of cesarean scar towards L  Scar Mobility: mod/min restriction in cesarean scar, increased restriction noted with palpation of LLQ along descending colon towards sigmoid     Pelvic Floor Muscle Assessment:     External Perineum:    Introitus: Closed  Skin Condition: Normal  Vaginal Vault: Myofascial Restriction  Rectal Vault: not tested      Internal Assessment:    Muscle & Soft Tissue Quality:     Levator Ani (LA): R (Spasm) L (Spasm), minimal   Obturator Internus (OI): R (Spasm) L (Spasm), moderate/severe L with TTP, moderate R    Levator Ani (LA):    LA Strength: 3.5/5    LA ROM:0.5    LA Endurance Holds: 6 Seconds, 3 Repetitions    LA Quick Flicks: 2 Seconds, 3 Repetitions    LA Specificity: Isolation    LA Coordination:   Contract: Good  Relax: Fair                                  I attest that I have reviewed the above information.  Signed: Marcha Dutton, PT  04/30/2022 11:26 AM

## 2022-05-05 DIAGNOSIS — Z1231 Encounter for screening mammogram for malignant neoplasm of breast: Principal | ICD-10-CM

## 2022-05-05 NOTE — Unmapped (Signed)
[facility] [address] [place]  OUTPATIENT PHYSICAL THERAPY  05/05/2022  Note Type: Treatment Note       Patient Name: Stephanie Hester  Date of Birth:[birth year]  Diagnosis:   Encounter Diagnoses   Name Primary?   ??? Myalgia of pelvic floor Yes   ??? Scar adherent      Referring MD:  [facility], [facility]*     Date of Onset of Impairment-05/01/2019  Date PT Care Plan Established or Reviewed-04/30/2022  Date PT Treatment Started-04/30/2022   Plan of Care Effective Date:      Visit # 2      Assessment/Plan:    Assessment  Assessment details:    May 05, 2022: Pt with pain reduction within session from 2/10 to 1/10 by end. Employed manual therapy techniques to reduce suprapubic and cesarean scar restrictions followed by abdominal stretches with pain reduction following. Due to symptoms of pain provoked with urge to urinate based on pt history, pt educated regarding healthy bladder habits, bladder irritants, and urge suppression techniques if urgency is present <2 hours from previous void.  Pt verbalized understanding. Pt will benefit from a continued course of skilled physical therapy with a focus on pelvic floor muscle coordination training, improving strength, and range of motion, manual therapy techniques, and bowel and bladder education. Pt will also benefit from progression of core and lower extremity strength to optimize control of bladder, bowel, and sexual function and support the lumbopelvic girdle.    Evaluation: Patient is a??41 y.o.  female who presents to pelvic floor physical therapy for evaluation with c/o of left lower quadrant pelvic pain that has been ongoing and constant for over 3 years. Pt reports pain started following a miscarriage several years ago and pain is aggravated with holding urge to urinate; with elevated gas/defecation occasionally; bending; and lifting. Patient demonstrates nocturia , elevated urinary frequency, overactive pelvic floor muscles, decreased pelvic floor strength, decreased core strength, and poor coordination of muscular control. Pt with concordant pain with palpation along cesarean scar and L obturator internus. Further assessment of the lumbopelvic-hip complex will be necessary at a future visit. Pt will benefit from course of skilled physical therapy with a focus on pelvic floor muscle coordination training, improving strength, and range of motion, manual therapy techniques, and bowel and bladder education. Pt will also benefit from progression of core and lower extremity strength to optimize control of bladder, bowel, and sexual function and support the lumbopelvic girdle. Moderate level complexity evaluation due to 1-2 personal factors and/or comorbidities, 3 or more body system elements addressed, and evolving clinical presentation.     Therapist wore a mask for the entire session.      All information regarding the expectations of participation in pelvic floor physical therapy, including but not limited to, the need for internal and/or external pelvic floor assessment, treatment techniques and plan of care were thoroughly discussed.  Patient confirms understanding that (s)he may bring a third party to any or all physical therapy sessions and can withdraw consent at any point throughout the plan of care.  Patient and guardian (if applicable) verbalize understanding of all above information and consent to pelvic floor assessment and treatment at evaluation and all future physical therapy sessions.     I reviewed the no-show/attendance policy with the patient and caregiver(s). The patient and/or family is aware that they must call to cancel appointments more than 24 hours in advance. They are also aware that if they late cancel (  less than 24 hours from appointment), arrives greater than 15 minutes late, or no-show three times, we reserve the right to cancel their remaining appointments. This policy is in place to allow [place] to best serve the needs of our caseload.      If patient returns to clinic with variance in plan of care, then it may be attributable to one or more of the following factors: preferred clinician availability, appointment time request availability, therapy pool appointment availability, major holiday with clinic closure, caregiver availability, patient transportation, conflicting medical appointment, inclement weather, and/or patient illness.    If patient does not return for follow up visit(s) related to this episode of care, this note will serve as their discharge note from Physical Therapy.            Impairments: bowel dysfunction, core weakness, decreased endurance, decreased knowledge of self-care, decreased strength, impaired coordination, impaired motor control, impaired tone, poor awareness of body mechanics, poor bowel/bladder habits, trigger points, urinary dysfunction, hypertonicity and poor self regulation        Specific Comorbidities: According to chart review, GERD, T2DM, HLD, TA, psoriasis          Prognosis: fair prognosis    Positive Prognosis Rationale: motivated for treatment.  Negative Prognosis Rationale: insight, financial status, Pain Status and severity of symptoms.    Barriers to therapy: financial    Therapy Goals      Goals:      In 6 weeks:     Patient will verbalize understanding of and need for consistency with behavioral changes such as but not limited to appropriate hydration, potential changes to diet, use of squatty potty for voiding, routine diaphragmatic breathing, relaxation exercises, mindfulness activities.    In 12 weeks:     Pt will demonstrate reduction in intrapelvic, abdominal, and other pelvic fascial restrictions with resolved tenderness to palpation during pelvic examination and return to normal resting tone with minimal to no evidence of hypertonicity.     Pt will be able to perform activities of daily living/work related responsibilities including but not limited to child care activities and bending to clean with minimal (pain <2/10) to no pain provocation or limitations.     Pt will be able to urinate and defecate with no pain provocation prior to, during, or following voiding.     Pt to improve urinary frequency to every 2-4 hours during daytime and </=1x/night for reduced life disruption from excessive urination.       Plan    Therapy options: will be seen for skilled physical therapy services    Planned therapy interventions: Bladder Retraining, Bowel Retraining, Diaphragmatic/Pursed-lip Breathing, E-Stim, Education - Patient, Education - Geophysicist/field seismologist, Endurance Activites, Functional Mobility, Home Exercise Program, Therapeutic Exercises, Therapeutic Activities, TENS, Self-Care/Home Training, Postural Training, Neuromuscular Re-education, Manual Therapy, Cryotherapy and Taping    DME Equipment: squatty potty.    Frequency: 1x week (1x/week for 12 weeks with reducing frequency as improvements are made)    Duration in weeks: 12 weeks    Education provided to: patient.    Education provided: Anatomy, Importance of Therapy, Treatment options and plan and Role of therapy in Rehabilitation    Education results: needs reinforcement.    Communication/Consultation: Initial note sent to Referring Provider.    Next visit plan:          Core strengthening   MFR abdomen prn   Lifting mechanics?       Treatment rendered today:      Manual Therapy  x 20 min    direct mobilization applied to suprapubic fascia, hypogastric fascia, and along cesarean scar utilizing instrument assisted techniques.  Patient AROM/functional mobility including diaphragmatic breathing included for added neural input. Pain reduction from 2/10 to 1/10 following. Taught pt bladder/low abdominal scoops to mimic treatment rendered today for home self-release.     _______________________________________________________________________________________________  Therapeutic Exercise x 5 min    Low trunk rotation x 30 seconds/side x 2  Open books x 10/side   _______________________________________________________________________________________________  Self Care x 13 min    Pt educated regarding:     BLADDER HEALTH    WHAT IS CONSIDERED NORMAL?  The average bladder can hold about 2 cups of urine before it needs to be emptied.  The normal range of voiding urine is 6 to 8 times during a 24 hour period. As we get older, our bladder capacity can get smaller and we may need to pass urine more frequently but usually not more than every 2 hours.  Urine should flow easily without discomfort in a good, steady stream until the bladder is empty. No pushing or straining is necessary to empty the bladder.  An urge is a signal that you feel as the bladder stretches to fill with urine. Urges can be felt even if the bladder is not full. Urges are not commands to go to the toilet, merely a signal and can be controlled.    WHAT ARE GOOD BLADDER HABITS?  Take your time when emptying your bladder. Don't strain or push to empty your bladder. Make sure you empty your bladder completely each time you pass urine. Do not rush the process.   Consistently ignoring the urge to go (waiting more than 4 hours between toileting) or urinating too infrequently may be convenient but not healthy for your bladder.  Avoid going to the toilet just in case or more often than every 2 hours. It is usually not necessary to go when you feel the first urge. Try to go only when your bladder is full. Urgency and frequency of urination can be improved by retraining the bladder and spacing your fluid intake throughout the day. Practice good toilet habits. Don't let your bladder control your life.    TIPS TO MAINTAIN GOOD BLADDER HABITS  Maintain a good fluid intake. Depending on your body size and environment, drink 6 -8 cups (8 oz each) of fluid per day unless otherwise advised by your doctor. Not enough fluid creates a foul odor and dark color of the urine.  Limit the amount of caffeine (coffee, cola, chocolate or tea) and citrus foods that you consume as these foods can be associated with increased sensation of urinary urgency and frequency. See the handout How Diet Can Affect Your Bladder for more information.  Limit the amount of alcohol you drink. Alcohol increases urine production and also makes it difficult for the brain to coordinate bladder control.  Avoid constipation by maintaining a balanced diet of dietary fiber.    Cigarette smoking is also irritating to the bladder surface and is associated with bladder cancer.  In addition, the coughing associated with smoking may lead to increased incontinent episodes.    Educated goal of urinary frequency is every 2-4 hours.     Pt educated regarding urge suppression techniques. When you feel the urge to urinate, sit down if able. Then perform several deep breaths and 5-6 pelvic floor contractions or heel raises to suppress detrusor contraction. Distract yourself mentally to avoid mental fixation on urination.  If urge passes, continue on with daily activities. You can use this strategy up to 3x. If urge does not change, then calmly walk to the restroom if urge present in <2 hour intervals for urination.     Pt was educated regarding potential bladder irritants to assist with management of urinary frequency, bladder discomfort, and/or bladder dysfunction including: caffeine, chocolate, carbonated beverages, alcohol, citrus, spicy food, acidic food/drink, aged cheeses, artificial sweeteners, and sugar.   _______________________________________________________________________________________________        Total Treatment Time; 38 min    Subjective:   History of Present Condition      History of Present Condition/Chief Complaint:       H/o of pelvic pain     Date of Onset:  05/01/2019  Date is an approximation?: yes  Explanation:  Pt states pain started approx 3 years ago following a miscarriage   Subjective:     Use of interpreter services via telephone to enhance communication.     ID# [id number]     May 05, 2022: Pain has been a bit lower. Pt has done scar massage and has been helpful.     Evaluation April 30, 2022: Pt reports chronic history of pelvic pain for approximately 3 years in low belly region on the left. Pain started after a miscarriage approximately 3 years ago according to the patient. According to chart review, pt had a septic abortion in 2022 with pain following. Pain is aggravated with gas/defecation, holding in urine for too long, lifting, and bending. Pain is distractible during day but when she goes to sleep she finds it difficult to find a comfortable position. She was told when she was in [place] that she has a hernia but most recent imaging did not find evidence of a hernia. 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   Quality of life: good    Pain  Current pain rating: 2  At best pain rating: 3  At worst pain rating: 9  Location: left lower quadrant  Quality: sharp, stabbing and constant  Relieving factors: rest  Aggravating factors: lifting and bending (defecation/gas)  Pain Related Behaviors: none  Progression: no change  Red flags: disturbed sleep.    Precautions and Equipment  Precautions: None  Current Braces/Orthoses: None  Equipment Currently Used: None  Prior Functional Status:     Functional Limitation(s)-No physical limitations  Current functional status: limited lifting and limited walking tolerance  Social Support  Lives in: two story house  Lives with: spouse (daughter - 70 years old)    Communication Preference: verbal, written and visual  Barriers to Learning: No Barriers  Work/School: Pt works as a Engineer, water for offices    Diagnostic Tests    Diagnostic Test Comments:       Reviewed in epic.     Treatments  Previous treatment: medication (pain medication, muscle relaxers)      Patient Goals  Patient goals for therapy: decreased pain and improved sleep (improve ability to play/take care of her 18 yo daughter)    Objective:   Objective    Hep:   + butterfly stretch, cesarean scar work, and bladder scoops                               I attest that I have reviewed the above information.  Signed: Marcha Dutton, PT  05/05/2022 10:53 AM

## 2022-05-09 ENCOUNTER — Ambulatory Visit: Admit: 2022-05-09 | Discharge: 2022-05-10 | Payer: PRIVATE HEALTH INSURANCE

## 2022-05-09 DIAGNOSIS — Z1231 Encounter for screening mammogram for malignant neoplasm of breast: Principal | ICD-10-CM

## 2022-05-12 NOTE — Unmapped (Signed)
[facility] [address] [place]  OUTPATIENT PHYSICAL THERAPY  05/12/2022  Note Type: Treatment Note       Patient Name: Stephanie Hester  Date of Birth:1981/10/26  Diagnosis:   Encounter Diagnoses   Name Primary?    Myalgia of pelvic floor Yes    Scar adherent      Referring MD:  [facility], [facility]*     Date of Onset of Impairment-05/01/2019  Date PT Care Plan Established or Reviewed-04/30/2022  Date PT Treatment Started-04/30/2022   Plan of Care Effective Date:      Visit # 3 (2/3 for insurance auth)      Assessment/Plan:    Assessment  Assessment details:    May 12, 2022: Session focused on continued manual therapy efforts to reduce myofascial and cesarean scar restrictions with pain reduction from 2/10 to 0/10 following. Pt also instructed through core engagement exercises to improve core support as well as review of lifting mechanics to improve tolerance for daily activities with reduced pain. Pt will benefit from a continued course of skilled physical therapy with a focus on pelvic floor muscle coordination training, improving strength, and range of motion, manual therapy techniques, and bowel and bladder education. Pt will also benefit from progression of core and lower extremity strength to optimize control of bladder, bowel, and sexual function and support the lumbopelvic girdle.    Evaluation: Patient is a 41 y.o.  female who presents to pelvic floor physical therapy for evaluation with c/o of left lower quadrant pelvic pain that has been ongoing and constant for over 3 years. Pt reports pain started following a miscarriage several years ago and pain is aggravated with holding urge to urinate; with elevated gas/defecation occasionally; bending; and lifting. Patient demonstrates nocturia , elevated urinary frequency, overactive pelvic floor muscles, decreased pelvic floor strength, decreased core strength, and poor coordination of muscular control. Pt with concordant pain with palpation along cesarean scar and L obturator internus. Further assessment of the lumbopelvic-hip complex will be necessary at a future visit. Pt will benefit from course of skilled physical therapy with a focus on pelvic floor muscle coordination training, improving strength, and range of motion, manual therapy techniques, and bowel and bladder education. Pt will also benefit from progression of core and lower extremity strength to optimize control of bladder, bowel, and sexual function and support the lumbopelvic girdle. Moderate level complexity evaluation due to 1-2 personal factors and/or comorbidities, 3 or more body system elements addressed, and evolving clinical presentation.     Therapist wore a mask for the entire session.      All information regarding the expectations of participation in pelvic floor physical therapy, including but not limited to, the need for internal and/or external pelvic floor assessment, treatment techniques and plan of care were thoroughly discussed.  Patient confirms understanding that (s)he may bring a third party to any or all physical therapy sessions and can withdraw consent at any point throughout the plan of care.  Patient and guardian (if applicable) verbalize understanding of all above information and consent to pelvic floor assessment and treatment at evaluation and all future physical therapy sessions.     I reviewed the no-show/attendance policy with the patient and caregiver(s). The patient and/or family is aware that they must call to cancel appointments more than 24 hours in advance. They are also aware that if they late cancel (less than 24 hours from appointment), arrives greater than 15 minutes late, or no-show three times,  we reserve the right to cancel their remaining appointments. This policy is in place to allow [place] to best serve the needs of our caseload.      If patient returns to clinic with variance in plan of care, then it may be attributable to one or more of the following factors: preferred clinician availability, appointment time request availability, therapy pool appointment availability, major holiday with clinic closure, caregiver availability, patient transportation, conflicting medical appointment, inclement weather, and/or patient illness.    If patient does not return for follow up visit(s) related to this episode of care, this note will serve as their discharge note from Physical Therapy.            Impairments: bowel dysfunction, core weakness, decreased endurance, decreased knowledge of self-care, decreased strength, impaired coordination, impaired motor control, impaired tone, poor awareness of body mechanics, poor bowel/bladder habits, trigger points, urinary dysfunction, hypertonicity and poor self regulation        Specific Comorbidities: According to chart review, GERD, T2DM, HLD, TA, psoriasis          Prognosis: fair prognosis    Positive Prognosis Rationale: motivated for treatment.  Negative Prognosis Rationale: insight, financial status, Pain Status and severity of symptoms.    Barriers to therapy: financial    Therapy Goals      Goals:      In 6 weeks:     Patient will verbalize understanding of and need for consistency with behavioral changes such as but not limited to appropriate hydration, potential changes to diet, use of squatty potty for voiding, routine diaphragmatic breathing, relaxation exercises, mindfulness activities.    In 12 weeks:     Pt will demonstrate reduction in intrapelvic, abdominal, and other pelvic fascial restrictions with resolved tenderness to palpation during pelvic examination and return to normal resting tone with minimal to no evidence of hypertonicity.     Pt will be able to perform activities of daily living/work related responsibilities including but not limited to child care activities and bending to clean with minimal (pain <2/10) to no pain provocation or limitations.     Pt will be able to urinate and defecate with no pain provocation prior to, during, or following voiding.     Pt to improve urinary frequency to every 2-4 hours during daytime and </=1x/night for reduced life disruption from excessive urination.       Plan    Therapy options: will be seen for skilled physical therapy services    Planned therapy interventions: Bladder Retraining, Bowel Retraining, Diaphragmatic/Pursed-lip Breathing, E-Stim, Education - Patient, Education - Geophysicist/field seismologist, Endurance Activites, Functional Mobility, Home Exercise Program, Therapeutic Exercises, Therapeutic Activities, TENS, Self-Care/Home Training, Postural Training, Neuromuscular Re-education, Manual Therapy, Cryotherapy and Taping    DME Equipment: squatty potty.    Frequency: 1x week (1x/week for 12 weeks with reducing frequency as improvements are made)    Duration in weeks: 12 weeks    Education provided to: patient.    Education provided: Anatomy, Importance of Therapy, Treatment options and plan and Role of therapy in Rehabilitation    Education results: needs reinforcement.    Communication/Consultation: Initial note sent to Referring Provider.    Next visit plan:          Review goals   Review HEP   More visits?       Treatment rendered today:      Manual Therapy x 25 min    direct mobilization applied to suprapubic fascia, hypogastric fascia, and along cesarean  scar utilizing instrument assisted techniques.  Patient AROM/functional mobility including diaphragmatic breathing included for added neural input. Pain reduction from 2/10 to 0/10 following.   _______________________________________________________________________________________________  Therapeutic Exercise x 8 min    Transverse abdominus (TrA) activation in hooklying f/b TrA activation with alt march f/b TrA activation with ASLR (performed bilaterally)   _______________________________________________________________________________  Therapeutic Activities x 5 min    Lifting mechanics with 7 lb weight with cueing to not hold breath, use core and gluteal engagement to improve tolerance for lifting.   _______________________________________________________________________________________________    Total Treatment Time; 38 min    Subjective:   History of Present Condition      History of Present Condition/Chief Complaint:       H/o of pelvic pain     Date of Onset:  05/01/2019  Date is an approximation?: yes  Explanation:  Pt states pain started approx 3 years ago following a miscarriage   Subjective:     Use of interpreter services via telephone to enhance communication.     ID# [id number]    May 12, 2022: I've been getting better. Pt is reporting improved sleep. Pt reports improved urinary function with urinating every 3-4 hours and no pain.     Evaluation April 30, 2022: Pt reports chronic history of pelvic pain for approximately 3 years in low belly region on the left. Pain started after a miscarriage approximately 3 years ago according to the patient. According to chart review, pt had a septic abortion in 2022 with pain following. Pain is aggravated with gas/defecation, holding in urine for too long, lifting, and bending. Pain is distractible during day but when she goes to sleep she finds it difficult to find a comfortable position. She was told when she was in [place] that she has a hernia but most recent imaging did not find evidence of a hernia. 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   Quality of life: good    Pain  Current pain rating: 1 (0/10 by end of session)  At best pain rating: 1  At worst pain rating: 3  Location: left lower quadrant  Quality: sharp, stabbing and constant  Relieving factors: rest  Aggravating factors: lifting and bending (defecation/gas)  Pain Related Behaviors: none  Progression: no change  Red flags: disturbed sleep.    Precautions and Equipment  Precautions: None  Current Braces/Orthoses: None  Equipment Currently Used: None  Prior Functional Status:     Functional Limitation(s)-No physical limitations  Current functional status: limited lifting and limited walking tolerance  Social Support  Lives in: two story house  Lives with: spouse (daughter - 67 years old)    Communication Preference: verbal, written and visual  Barriers to Learning: No Barriers  Work/School: Pt works as a Engineer, water for offices    Diagnostic Tests    Diagnostic Test Comments:       Reviewed in epic.     Treatments  Previous treatment: medication (pain medication, muscle relaxers)      Patient Goals  Patient goals for therapy: decreased pain and improved sleep (improve ability to play/take care of her 19 yo daughter)    Objective:   Objective    Hep:   + butterfly stretch, cesarean scar work, and bladder scoops                               I attest that I have  reviewed the above information.  Signed: Marcha Dutton, PT  05/12/2022 12:32 PM

## 2022-05-15 NOTE — Unmapped (Signed)
[facility] Refill Coordination Note    Specialty Medication(s) to be Shipped:   Inflammatory Disorders: Humira    Other medication(s) to be shipped: No additional medications requested for fill at this time     Stephanie Hester, DOB: [birth year]  Phone: [phone] (home)       All above HIPAA information was verified with patient.     Was a Nurse, learning disability used for this call? No    Completed refill call assessment today to schedule patient's medication shipment from the [facility] [phone]).  All relevant notes have been reviewed.     Specialty medication(s) and dose(s) confirmed: Regimen is correct and unchanged.   Changes to medications: Maram reports no changes at this time.  Changes to insurance: No  New side effects reported not previously addressed with a pharmacist or physician: None reported  Questions for the pharmacist: No    Confirmed patient received a Conservation officer, historic buildings and a Surveyor, mining with first shipment. The patient will receive a drug information handout for each medication shipped and additional FDA Medication Guides as required.       DISEASE/MEDICATION-SPECIFIC INFORMATION        For patients on injectable medications: Patient currently has 0 doses left.  Next injection is scheduled for 05/23/22.    SPECIALTY MEDICATION ADHERENCE     Medication Adherence    Patient reported X missed doses in the last month: 0  Specialty Medication: HUMIRA  Patient is on additional specialty medications: No              Were doses missed due to medication being on hold? No    Humira 40/0.4 mg/ml: 0 days of medicine on hand        REFERRAL TO PHARMACIST     Referral to the pharmacist: Not needed      [facility]     Shipping address confirmed in Epic.     Delivery Scheduled: Yes, Expected medication delivery date: 05/19/22.     Medication will be delivered via Same Day Courier to the prescription address in Epic WAM.    Unk Lightning   [facility] Specialty Technician

## 2022-05-16 ENCOUNTER — Ambulatory Visit: Admit: 2022-05-16 | Discharge: 2022-05-17 | Payer: PRIVATE HEALTH INSURANCE

## 2022-05-16 DIAGNOSIS — M7918 Myalgia, other site: Principal | ICD-10-CM

## 2022-05-16 DIAGNOSIS — Z3009 Encounter for other general counseling and advice on contraception: Principal | ICD-10-CM

## 2022-05-16 DIAGNOSIS — N939 Abnormal uterine and vaginal bleeding, unspecified: Principal | ICD-10-CM
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
## 2022-05-17 NOTE — Unmapped (Signed)
Thank you for receiving care at [facility]) ! If you have any questions or concerns you have two ways to contact our team:    1) Nurses line: [phone]    2) MyChart messages: These messages are checked by the nurses during normal business hours 8:30 am-4:30 pm Monday-Friday every 24-48 hours and are for non-urgent, non-emergent concerns. You may be asked to return for a follow up visit if it is deemed your questions are best handled in the clinic setting.    __________________________________________________________________________

## 2022-05-19 MED FILL — HUMIRA PEN CITRATE FREE 40 MG/0.4 ML: SUBCUTANEOUS | 28 days supply | Qty: 2 | Fill #10

## 2022-05-21 NOTE — Unmapped (Signed)
[facility] [address] [place]  OUTPATIENT PHYSICAL THERAPY  05/21/2022  Note Type: Re-evaluation/Progress Note       Patient Name: Stephanie Hester  Date of Birth:May 02, 1981  Diagnosis:   Encounter Diagnoses   Name Primary?    Myalgia of pelvic floor Yes    Scar adherent      Referring MD:  [facility], [facility]*     Date of Onset of Impairment-05/01/2019  Date PT Care Plan Established or Reviewed-04/30/2022  Date PT Treatment Started-04/30/2022   Plan of Care Effective Date:      Visit # 4 (3/3 for insurance auth - requesting additional authorization)      Assessment/Plan:    Assessment  Assessment details:    May 21, 2022: Progress update performed today. Thus far, pt has met 1/1 of her STG and 2/4 of her LTG with an additional goal based on pelvic floor muscle assessment. Pt with elevated tone and concordant tenderness to palpation of her left obturator internus and levator ani. This has impacted her ability to tolerate sexual intimacy. Pt also continues to experience occasional pain increases in her abdomen with daily living although intensity and frequency has improved since her evaluation. She endorsed 90% improvement since starting PT.  Pt will benefit from a continued course of skilled physical therapy with a focus on pelvic floor muscle coordination training, improving strength, and range of motion, manual therapy techniques, and bowel and bladder education. Pt will also benefit from progression of core and lower extremity strength to optimize control of bladder, bowel, and sexual function and support the lumbopelvic girdle.    Evaluation: Patient is a 41 y.o.  female who presents to pelvic floor physical therapy for evaluation with c/o of left lower quadrant pelvic pain that has been ongoing and constant for over 3 years. Pt reports pain started following a miscarriage several years ago and pain is aggravated with holding urge to urinate; with elevated gas/defecation occasionally; bending; and lifting. Patient demonstrates nocturia , elevated urinary frequency, overactive pelvic floor muscles, decreased pelvic floor strength, decreased core strength, and poor coordination of muscular control. Pt with concordant pain with palpation along cesarean scar and L obturator internus. Further assessment of the lumbopelvic-hip complex will be necessary at a future visit. Pt will benefit from course of skilled physical therapy with a focus on pelvic floor muscle coordination training, improving strength, and range of motion, manual therapy techniques, and bowel and bladder education. Pt will also benefit from progression of core and lower extremity strength to optimize control of bladder, bowel, and sexual function and support the lumbopelvic girdle. Moderate level complexity evaluation due to 1-2 personal factors and/or comorbidities, 3 or more body system elements addressed, and evolving clinical presentation.     Therapist wore a mask for the entire session.      All information regarding the expectations of participation in pelvic floor physical therapy, including but not limited to, the need for internal and/or external pelvic floor assessment, treatment techniques and plan of care were thoroughly discussed.  Patient confirms understanding that (s)he may bring a third party to any or all physical therapy sessions and can withdraw consent at any point throughout the plan of care.  Patient and guardian (if applicable) verbalize understanding of all above information and consent to pelvic floor assessment and treatment at evaluation and all future physical therapy sessions.     I reviewed the no-show/attendance policy with the patient and caregiver(s). The patient and/or family  is aware that they must call to cancel appointments more than 24 hours in advance. They are also aware that if they late cancel (less than 24 hours from appointment), arrives greater than 15 minutes late, or no-show three times, we reserve the right to cancel their remaining appointments. This policy is in place to allow [place] to best serve the needs of our caseload.      If patient returns to clinic with variance in plan of care, then it may be attributable to one or more of the following factors: preferred clinician availability, appointment time request availability, therapy pool appointment availability, major holiday with clinic closure, caregiver availability, patient transportation, conflicting medical appointment, inclement weather, and/or patient illness.    If patient does not return for follow up visit(s) related to this episode of care, this note will serve as their discharge note from Physical Therapy.            Impairments: bowel dysfunction, core weakness, decreased endurance, decreased knowledge of self-care, decreased strength, impaired coordination, impaired motor control, impaired tone, poor awareness of body mechanics, poor bowel/bladder habits, trigger points, urinary dysfunction, hypertonicity and poor self regulation        Specific Comorbidities: According to chart review, GERD, T2DM, HLD, TA, psoriasis          Prognosis: fair prognosis    Positive Prognosis Rationale: motivated for treatment.  Negative Prognosis Rationale: insight, financial status, Pain Status and severity of symptoms.    Barriers to therapy: financial    Therapy Goals      Goals:      In 6 weeks:     Patient will verbalize understanding of and need for consistency with behavioral changes such as but not limited to appropriate hydration, potential changes to diet, use of squatty potty for voiding, routine diaphragmatic breathing, relaxation exercises, mindfulness activities. - Goal met 05/21/22     In 12 weeks:     Pt will demonstrate reduction in intrapelvic, abdominal, and other pelvic fascial restrictions with resolved tenderness to palpation during pelvic examination and return to normal resting tone with minimal to no evidence of hypertonicity. - Goal in progress, improving abdominal restrictions but moderate spasm noted in L of intrapelvic musculature (OI>LA)    Pt will be able to tolerate sexual intimacy with <2/10 pain. - Goal updated 05/21/22 based on findings of pelvic assessment and pt report    Pt will be able to perform activities of daily living/work related responsibilities including but not limited to child care activities and bending to clean with minimal (pain <2/10) to no pain provocation or limitations. - Goal in progress 2/10 pain on one occasion in the last week     Pt will be able to urinate and defecate with no pain provocation prior to, during, or following voiding. - Goal met 05/21/22    Pt to improve urinary frequency to every 2-4 hours during daytime and </=1x/night for reduced life disruption from excessive urination. - Goal met 05/21/22      Plan    Therapy options: will be seen for skilled physical therapy services    Planned therapy interventions: Bladder Retraining, Bowel Retraining, Diaphragmatic/Pursed-lip Breathing, E-Stim, Education - Patient, Education - Geophysicist/field seismologist, Endurance Activites, Functional Mobility, Home Exercise Program, Therapeutic Exercises, Therapeutic Activities, TENS, Self-Care/Home Training, Postural Training, Neuromuscular Re-education, Manual Therapy, Cryotherapy and Taping    DME Equipment: squatty potty.    Frequency: 1x week (1x/week for 12 weeks with reducing frequency as improvements are made)  Duration in weeks: 12 weeks    Education provided to: patient.    Education provided: Anatomy, Importance of Therapy, Treatment options and plan and Role of therapy in Rehabilitation    Education results: needs reinforcement.    Communication/Consultation: Initial note sent to Referring Provider.    Next visit plan:          Review HEP   MFR pelvic restrictions   Edu sexual positioning      Treatment rendered today:      Manual Therapy x 25 min    Pelvic assessment to determine contribution to current symptoms. Findings outlined in objective. Simultaneous review of goals.     Downtraining of pelvic floor facilitated by single digit palpation of left obturator internus (OI) and levator ani (LA) intravaginally with sustained holds in order to promote reduction in pain and overall-tone to restore range of motion necessary for appropriate bowel, bladder, and sexual function.   _______________________________________________________________________________________________  Therapeutic Exercise x 8 min    Happy baby stretch   Diaphragmatic breathing  Cat/cow x 20   Child pose stretch   Reverse kegel - 50% contraction followed by full relaxation and 2 deep breaths   _______________________________________________________________________________________________    Total Treatment Time; 33 min    Subjective:   History of Present Condition      History of Present Condition/Chief Complaint:       H/o of pelvic pain     Date of Onset:  05/01/2019  Date is an approximation?: yes  Explanation:  Pt states pain started approx 3 years ago following a miscarriage   Subjective:     Use of interpreter services via telephone to enhance communication.     ID# [id number], Camila    May 21, 2022: Pain has improved since her last appointment. Pt endorses sense of burning sensation in low abdomen/pelvis one day - when cooking in her kitchen, had to lie down. Pt has some cramping with irregular periods.     Evaluation April 30, 2022: Pt reports chronic history of pelvic pain for approximately 3 years in low belly region on the left. Pain started after a miscarriage approximately 3 years ago according to the patient. According to chart review, pt had a septic abortion in 2022 with pain following. Pain is aggravated with gas/defecation, holding in urine for too long, lifting, and bending. Pain is distractible during day but when she goes to sleep she finds it difficult to find a comfortable position. She was told when she was in [place] that she has a hernia but most recent imaging did not find evidence of a hernia. 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   Quality of life: good    Pain  Current pain rating: 0  At best pain rating: 1  At worst pain rating: 2  Location: left lower quadrant  Quality: sharp, stabbing and constant  Relieving factors: rest  Aggravating factors: lifting and bending (defecation/gas)  Pain Related Behaviors: none  Progression: no change  Red flags: disturbed sleep.    Precautions and Equipment  Precautions: None  Current Braces/Orthoses: None  Equipment Currently Used: None  Prior Functional Status:     Functional Limitation(s)-No physical limitations  Current functional status: limited lifting and limited walking tolerance  Social Support  Lives in: two story house  Lives with: spouse (daughter - 12 years old)    Communication Preference: verbal, written and visual  Barriers to Learning: No Barriers  Work/School: Pt  works as a Runner, broadcasting/film/video Tests    Diagnostic Test Comments:       Reviewed in epic.     Treatments  Previous treatment: medication (pain medication, muscle relaxers)      Patient Goals  Patient goals for therapy: decreased pain and improved sleep (improve ability to play/take care of her 68 yo daughter)    Objective:   Objective    OBJECTIVE      Pelvic Floor Muscle Assessment:     External Perineum:    Introitus: Closed  Skin Condition: Normal  Ant Prolapse Test: Absent  Post Prolapse Test: Absent  Vaginal Vault: Myofascial Restriction - mild        Internal Assessment:    Muscle & Soft Tissue Quality:     Levator Ani (LA): R (Normal) L (Spasm)  Obturator Internus (OI): R (Normal) L (Spasm), TTP on L    Levator Ani (LA):    LA Strength: 3    LA ROM:0.5    LA Endurance Holds: not assessed    LA Quick Flicks: not assessed    LA Specificity: Isolation    LA Coordination:   Contract: Good  Relax: Fair  Bear Down: not assessed  Valsalva: No    The patient provided verbal consent for internal assessment and treatment, verbalized understanding of why the patient is participating in intrapelvic examination and/or treatment. The patient was given a choice between external assessment/treatment and chose an vaginal exam. The patient gave verbal permission, just prior to the examination for the internal vaginal examination/treatment. The patient expressed understanding that they could stop the exam/treatment at any time.         Hep:   + butterfly stretch, cesarean scar work, and bladder scoops     Handout provided today (HEP expanded):                               I attest that I have reviewed the above information.  Signed: Marcha Dutton, PT  05/21/2022 12:09 PM

## 2022-05-26 NOTE — Unmapped (Signed)
[facility]  [address]                                    [place], [place] [address]    Kealy cancelled in <24 hours her scheduled pelvic floor physical Therapy follow-up session.  Heena has been contacted via telephone call. Utilizing telephone interpreter ID [phone]. Pt cancelled as she wants to await information from her insurance company regarding approval for additional visits. I spoke with the patient and informed her when I received updates about approval of additional visits I would contact her. She wished to defer scheduling additional visits at this time until additional visits are approved.      Signed: Marcha Dutton, PT  05/26/2022, 8:20 AM

## 2022-06-09 NOTE — Unmapped (Signed)
[facility] Refill Coordination Note    Specialty Medication(s) to be Shipped:   Inflammatory Disorders: Humira    Other medication(s) to be shipped: Clobetasol ext solution     Stephanie Hester, DOB: 13-Jun-1981  Phone: [phone] (home)       All above HIPAA information was verified with patient.     Was a Nurse, learning disability used for this call? No    Completed refill call assessment today to schedule patient's medication shipment from the [facility] [phone]).  All relevant notes have been reviewed.     Specialty medication(s) and dose(s) confirmed: Regimen is correct and unchanged.   Changes to medications: [name] reports no changes at this time.  Changes to insurance: No  New side effects reported not previously addressed with a pharmacist or physician: None reported  Questions for the pharmacist: No    Confirmed patient received a Conservation officer, historic buildings and a Surveyor, mining with first shipment. The patient will receive a drug information handout for each medication shipped and additional FDA Medication Guides as required.       DISEASE/MEDICATION-SPECIFIC INFORMATION        For patients on injectable medications: Patient currently has 0 doses left.  Next injection is scheduled for 06/20/2022.    SPECIALTY MEDICATION ADHERENCE     Medication Adherence    Patient reported X missed doses in the last month: 0  Specialty Medication: Humira  Patient is on additional specialty medications: No  Any gaps in refill history greater than 2 weeks in the last 3 months: no  Demonstrates understanding of importance of adherence: yes  Informant: patient  Reliability of informant: reliable              Confirmed plan for next specialty medication refill: delivery by pharmacy  Refills needed for supportive medications: not needed              Were doses missed due to medication being on hold? No    Humira 40/0.4 mg/ml: 0 days of medicine on hand         REFERRAL TO PHARMACIST Referral to the pharmacist: Not needed      [facility]     Shipping address confirmed in Epic.     Delivery Scheduled: Yes, Expected medication delivery date: 06/13/2022.     Medication will be delivered via Same Day Courier to the prescription address in Epic WAM.    [name]   [facility] Specialty Technician

## 2022-06-09 NOTE — Unmapped (Signed)
Called Stephanie Hester with telephone interpreter support Elita Quick ID # [phone]) to inform her that she was approved for 2 additional PT visits. Left a voicemail informing her that if she is interested in returning to [facility] then she can call our office back to schedule 2 additional visits. Call back number provided.

## 2022-06-13 MED FILL — HUMIRA PEN CITRATE FREE 40 MG/0.4 ML: SUBCUTANEOUS | 28 days supply | Qty: 2 | Fill #11

## 2022-06-13 MED FILL — CLOBETASOL 0.05 % SCALP SOLUTION: TOPICAL | 30 days supply | Qty: 50 | Fill #3

## 2022-06-25 ENCOUNTER — Ambulatory Visit: Admit: 2022-06-25 | Discharge: 2022-06-28 | Payer: PRIVATE HEALTH INSURANCE

## 2022-06-25 NOTE — Unmapped (Signed)
[facility] [address] [facility]  OUTPATIENT PHYSICAL THERAPY  06/25/2022  Note Type: Treatment Note       Patient Name: Stephanie Hester  Date of Birth:30-Oct-1980  Diagnosis:   Encounter Diagnoses   Name Primary?    Myalgia of pelvic floor Yes    Scar adherent      Referring MD:  [facility], [facility]*     Date of Onset of Impairment-05/01/2019  Date PT Care Plan Established or Reviewed-04/30/2022  Date PT Treatment Started-04/30/2022   Plan of Care Effective Date:      Visit # 5 (1/2 for new auth)      Assessment/Plan:    Assessment  Assessment details:    June 25, 2022: Pt with increased pain levels from previous visit that she attributes to increase life stressors and additional labor related activities caring for her mother. As a result, pt presents today with 8/10 abdominal pain at start of visit but following manual therapy efforts reduced to 1/10. Due to reports of increased constipation since her last visit, performed and educated patient on colonic massage to encourage increased colonic motility. Session also included encouragement for pt to resume home exercise program to help reduce pain flares and manage symptoms long term. Pt will benefit from a continued course of skilled physical therapy with a focus on pelvic floor muscle coordination training, improving strength, and range of motion, manual therapy techniques, and bowel and bladder education. Pt will also benefit from progression of core and lower extremity strength to optimize control of bladder, bowel, and sexual function and support the lumbopelvic girdle.    Evaluation: Patient is a 41 y.o.  female who presents to pelvic floor physical therapy for evaluation with c/o of left lower quadrant pelvic pain that has been ongoing and constant for over 3 years. Pt reports pain started following a miscarriage several years ago and pain is aggravated with holding urge to urinate; with elevated gas/defecation occasionally; bending; and lifting. Patient demonstrates nocturia , elevated urinary frequency, overactive pelvic floor muscles, decreased pelvic floor strength, decreased core strength, and poor coordination of muscular control. Pt with concordant pain with palpation along cesarean scar and L obturator internus. Further assessment of the lumbopelvic-hip complex will be necessary at a future visit. Pt will benefit from course of skilled physical therapy with a focus on pelvic floor muscle coordination training, improving strength, and range of motion, manual therapy techniques, and bowel and bladder education. Pt will also benefit from progression of core and lower extremity strength to optimize control of bladder, bowel, and sexual function and support the lumbopelvic girdle. Moderate level complexity evaluation due to 1-2 personal factors and/or comorbidities, 3 or more body system elements addressed, and evolving clinical presentation.     Therapist wore a mask for the entire session.      All information regarding the expectations of participation in pelvic floor physical therapy, including but not limited to, the need for internal and/or external pelvic floor assessment, treatment techniques and plan of care were thoroughly discussed.  Patient confirms understanding that (s)he may bring a third party to any or all physical therapy sessions and can withdraw consent at any point throughout the plan of care.  Patient and guardian (if applicable) verbalize understanding of all above information and consent to pelvic floor assessment and treatment at evaluation and all future physical therapy sessions.     I reviewed the no-show/attendance policy with the patient and caregiver(s). The patient and/or family  is aware that they must call to cancel appointments more than 24 hours in advance. They are also aware that if they late cancel (less than 24 hours from appointment), arrives greater than 15 minutes late, or no-show three times, we reserve the right to cancel their remaining appointments. This policy is in place to allow [place] to best serve the needs of our caseload.      If patient returns to clinic with variance in plan of care, then it may be attributable to one or more of the following factors: preferred clinician availability, appointment time request availability, therapy pool appointment availability, major holiday with clinic closure, caregiver availability, patient transportation, conflicting medical appointment, inclement weather, and/or patient illness.    If patient does not return for follow up visit(s) related to this episode of care, this note will serve as their discharge note from Physical Therapy.            Impairments: bowel dysfunction, core weakness, decreased endurance, decreased knowledge of self-care, decreased strength, impaired coordination, impaired motor control, impaired tone, poor awareness of body mechanics, poor bowel/bladder habits, trigger points, urinary dysfunction, hypertonicity and poor self regulation        Specific Comorbidities: According to chart review, GERD, T2DM, HLD, TA, psoriasis          Prognosis: fair prognosis    Positive Prognosis Rationale: motivated for treatment.  Negative Prognosis Rationale: insight, financial status, Pain Status and severity of symptoms.    Barriers to therapy: financial    Therapy Goals      Goals:      In 6 weeks:     Patient will verbalize understanding of and need for consistency with behavioral changes such as but not limited to appropriate hydration, potential changes to diet, use of squatty potty for voiding, routine diaphragmatic breathing, relaxation exercises, mindfulness activities. - Goal met 05/21/22     In 12 weeks:     Pt will demonstrate reduction in intrapelvic, abdominal, and other pelvic fascial restrictions with resolved tenderness to palpation during pelvic examination and return to normal resting tone with minimal to no evidence of hypertonicity. - Goal in progress, improving abdominal restrictions but moderate spasm noted in L of intrapelvic musculature (OI>LA)    Pt will be able to tolerate sexual intimacy with <2/10 pain. - Goal updated 05/21/22 based on findings of pelvic assessment and pt report    Pt will be able to perform activities of daily living/work related responsibilities including but not limited to child care activities and bending to clean with minimal (pain <2/10) to no pain provocation or limitations. - Goal in progress 2/10 pain on one occasion in the last week     Pt will be able to urinate and defecate with no pain provocation prior to, during, or following voiding. - Goal met 05/21/22    Pt to improve urinary frequency to every 2-4 hours during daytime and </=1x/night for reduced life disruption from excessive urination. - Goal met 05/21/22      Plan    Therapy options: will be seen for skilled physical therapy services    Planned therapy interventions: Bladder Retraining, Bowel Retraining, Diaphragmatic/Pursed-lip Breathing, E-Stim, Education - Patient, Education - Geophysicist/field seismologist, Endurance Activites, Functional Mobility, Home Exercise Program, Therapeutic Exercises, Therapeutic Activities, TENS, Self-Care/Home Training, Postural Training, Neuromuscular Re-education, Manual Therapy, Cryotherapy and Taping    DME Equipment: squatty potty.    Frequency: 1x week (1x/week for 12 weeks with reducing frequency as improvements are made)  Duration in weeks: 12 weeks    Education provided to: patient.    Education provided: Anatomy, Importance of Therapy, Treatment options and plan and Role of therapy in Rehabilitation    Education results: needs reinforcement.      Next visit plan:          Pelvic floor exam?   Review goals   Review long term HEP   discharge      Treatment rendered today:      Manual Therapy x 30 min    Myofascial release using silicone instrument tool along lower abdomen, particularly LLQ, along with skin rolling and petrissage technqiues with near-full resolution of pain by end.     Performed and assisted patient in abdominal massage to promote colonic motility, relieve abdominal discomfort, and improve ease of bowel movements. Patient with return demonstration and verbalized understanding.   _______________________________________________________________________________________________  Self Care x 8 min    Reviewed toileting posture and importance to avoid constipation.     Reviewed importance of consistency of HEP to manage pain symptoms long-term.   _______________________________________________________________________________________________    Total Treatment Time: 38 min      Subjective:   History of Present Condition      History of Present Condition/Chief Complaint:       H/o of pelvic pain     Date of Onset:  05/01/2019  Date is an approximation?: yes  Explanation:  Pt states pain started approx 3 years ago following a miscarriage   Subjective:     Use of interpreter services via telephone to enhance communication.     ID# [id number]    June 25, 2022: Things were going good but I had to go to [place] for an emergency. I couldn't do therapy exercises and had to work a lot with my mom and carry her, change diaper, bath. She unfortunately passed away. Pain has decreased a little bit but it feels like something has ripped open. Pt denies leakage since her last visit.     Evaluation April 30, 2022: Pt reports chronic history of pelvic pain for approximately 3 years in low belly region on the left. Pain started after a miscarriage approximately 3 years ago according to the patient. According to chart review, pt had a septic abortion in 2022 with pain following. Pain is aggravated with gas/defecation, holding in urine for too long, lifting, and bending. Pain is distractible during day but when she goes to sleep she finds it difficult to find a comfortable position. She was told when she was in [place] that she has a hernia but most recent imaging did not find evidence of a hernia. 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   Quality of life: good    Pain  Current pain rating: 0  At best pain rating: 1  At worst pain rating: 2  Location: left lower quadrant  Quality: sharp, stabbing and constant  Relieving factors: rest  Aggravating factors: lifting and bending (defecation/gas)  Pain Related Behaviors: none  Progression: no change  Red flags: disturbed sleep.    Precautions and Equipment  Precautions: None  Current Braces/Orthoses: None  Equipment Currently Used: None  Prior Functional Status:     Functional Limitation(s)-No physical limitations  Current functional status: limited lifting and limited walking tolerance  Social Support  Lives in: two story house  Lives with: spouse (daughter - 61 years old)    Communication Preference: verbal, written and visual  Barriers to Learning:  No Barriers  Work/School: Pt works as a Runner, broadcasting/film/video Tests    Diagnostic Test Comments:       Reviewed in epic.     Treatments  Previous treatment: medication (pain medication, muscle relaxers)      Patient Goals  Patient goals for therapy: decreased pain and improved sleep (improve ability to play/take care of her 34 yo daughter)      Objective:   Objective      Hep:   + butterfly stretch, cesarean scar work, and bladder scoops     Handout provided today (HEP expanded):                               I attest that I have reviewed the above information.  Signed: Marcha Dutton, PT  06/25/2022 11:50 AM

## 2022-07-04 DIAGNOSIS — L409 Psoriasis, unspecified: Principal | ICD-10-CM

## 2022-07-04 MED ORDER — HUMIRA PEN CITRATE FREE 40 MG/0.4 ML
SUBCUTANEOUS | 3 refills | 28.00000 days | Status: CP
Start: 2022-07-04 — End: ?
  Filled 2022-07-11: qty 2, 28d supply, fill #0

## 2022-07-04 NOTE — Unmapped (Signed)
Appt 9/25

## 2022-07-04 NOTE — Unmapped (Signed)
Needs a follow up or to re-establish care somewhere prior to further refills.

## 2022-07-10 NOTE — Unmapped (Signed)
[facility] Refill Coordination Note    Specialty Medication(s) to be Shipped:   Inflammatory Disorders: Humira    Other medication(s) to be shipped: No additional medications requested for fill at this time     Stephanie Hester, DOB: 1981/03/22  Phone: [phone] (home)       All above HIPAA information was verified with patient.     Was a Nurse, learning disability used for this call? No    Completed refill call assessment today to schedule patient's medication shipment from the [facility] [phone]).  All relevant notes have been reviewed.     Specialty medication(s) and dose(s) confirmed: Regimen is correct and unchanged.   Changes to medications: Nami reports no changes at this time.  Changes to insurance: No  New side effects reported not previously addressed with a pharmacist or physician: None reported  Questions for the pharmacist: No    Confirmed patient received a Conservation officer, historic buildings and a Surveyor, mining with first shipment. The patient will receive a drug information handout for each medication shipped and additional FDA Medication Guides as required.       DISEASE/MEDICATION-SPECIFIC INFORMATION        For patients on injectable medications: Patient currently has 0 doses left.  Next injection is scheduled for 07/18/2022.    SPECIALTY MEDICATION ADHERENCE     Medication Adherence    Patient reported X missed doses in the last month: 0  Specialty Medication: Humira  Patient is on additional specialty medications: No  Any gaps in refill history greater than 2 weeks in the last 3 months: no  Demonstrates understanding of importance of adherence: yes  Informant: patient  Reliability of informant: reliable              Confirmed plan for next specialty medication refill: delivery by pharmacy  Refills needed for supportive medications: not needed              Were doses missed due to medication being on hold? No    Humira 40/0.4 mg/ml: 0 days of medicine on hand         REFERRAL TO PHARMACIST     Referral to the pharmacist: Not needed      [facility]     Shipping address confirmed in Epic.     Delivery Scheduled: Yes, Expected medication delivery date: 07/11/2022.     Medication will be delivered via Same Day Courier to the prescription address in Epic WAM.    [name]   [facility] Specialty Technician

## 2022-07-16 ENCOUNTER — Ambulatory Visit: Admit: 2022-07-16 | Payer: PRIVATE HEALTH INSURANCE

## 2022-07-16 NOTE — Unmapped (Signed)
[facility] [address] [place]  OUTPATIENT PHYSICAL THERAPY  07/16/2022  Note Type: Discharge Note       Patient Name: Stephanie Hester  Date of Birth:12-01-1980  Diagnosis:   Encounter Diagnoses   Name Primary?    Myalgia of pelvic floor Yes    Scar adherent      Referring MD:  [facility]     Date of Onset of Impairment-05/01/2019  Date PT Care Plan Established or Reviewed-04/30/2022  Date PT Treatment Started-04/30/2022   Plan of Care Effective Date:      Visit # 6 (2/2 for new auth)      Assessment/Plan:    Assessment  Assessment details:    Today's Assessment: Pt is discharged from therapy and no longer requires skilled physical therapy as they have met all of their goals. In 6 visits, she met 1/1 of her STG and all of her LTG. Pt has demonstrated improvements in improved severity of pelvic/low abdominal pain during functional activities with no more than 1/10 pain with child care activities, no tenderness during pelvic examination and normal tone, no reported pain with sexual intimacy, normal urinary function, and no pain with bowel/bladder function. Pt will benefit from continued utilization of their home exercise program and self management techniques incorporated into daily routine for independent management of their condition and associated symptoms as well as to maximize their quality of life and overall function.       Assessment from Evaluation: Patient is a 41 y.o.  female who presents to pelvic floor physical therapy for evaluation with c/o of left lower quadrant pelvic pain that has been ongoing and constant for over 3 years. Pt reports pain started following a miscarriage several years ago and pain is aggravated with holding urge to urinate; with elevated gas/defecation occasionally; bending; and lifting. Patient demonstrates nocturia , elevated urinary frequency, overactive pelvic floor muscles, decreased pelvic floor strength, decreased core strength, and poor coordination of muscular control. Pt with concordant pain with palpation along cesarean scar and L obturator internus. Further assessment of the lumbopelvic-hip complex will be necessary at a future visit. Pt will benefit from course of skilled physical therapy with a focus on pelvic floor muscle coordination training, improving strength, and range of motion, manual therapy techniques, and bowel and bladder education. Pt will also benefit from progression of core and lower extremity strength to optimize control of bladder, bowel, and sexual function and support the lumbopelvic girdle. Moderate level complexity evaluation due to 1-2 personal factors and/or comorbidities, 3 or more body system elements addressed, and evolving clinical presentation.     Therapist wore a mask for the entire session.      All information regarding the expectations of participation in pelvic floor physical therapy, including but not limited to, the need for internal and/or external pelvic floor assessment, treatment techniques and plan of care were thoroughly discussed.  Patient confirms understanding that (s)he may bring a third party to any or all physical therapy sessions and can withdraw consent at any point throughout the plan of care.  Patient and guardian (if applicable) verbalize understanding of all above information and consent to pelvic floor assessment and treatment at evaluation and all future physical therapy sessions.     I reviewed the no-show/attendance policy with the patient and caregiver(s). The patient and/or family is aware that they must call to cancel appointments more than 24 hours in advance. They are also aware that if they late cancel (  less than 24 hours from appointment), arrives greater than 15 minutes late, or no-show three times, we reserve the right to cancel their remaining appointments. This policy is in place to allow [place] to best serve the needs of our caseload.      If patient returns to clinic with variance in plan of care, then it may be attributable to one or more of the following factors: preferred clinician availability, appointment time request availability, therapy pool appointment availability, major holiday with clinic closure, caregiver availability, patient transportation, conflicting medical appointment, inclement weather, and/or patient illness.    If patient does not return for follow up visit(s) related to this episode of care, this note will serve as their discharge note from Physical Therapy.            Impairments: bowel dysfunction, core weakness, decreased endurance, decreased knowledge of self-care, decreased strength, impaired coordination, impaired motor control, impaired tone, poor awareness of body mechanics, poor bowel/bladder habits, trigger points, urinary dysfunction, hypertonicity and poor self regulation        Specific Comorbidities: According to chart review, GERD, T2DM, HLD, TA, psoriasis          Prognosis: fair prognosis    Positive Prognosis Rationale: motivated for treatment.  Negative Prognosis Rationale: insight, financial status, Pain Status and severity of symptoms.    Barriers to therapy: financial    Therapy Goals      Goals:      In 6 weeks:     Patient will verbalize understanding of and need for consistency with behavioral changes such as but not limited to appropriate hydration, potential changes to diet, use of squatty potty for voiding, routine diaphragmatic breathing, relaxation exercises, mindfulness activities. - Goal met 05/21/22, maintained today 07/16/22    In 12 weeks:     Pt will demonstrate reduction in intrapelvic, abdominal, and other pelvic fascial restrictions with resolved tenderness to palpation during pelvic examination and return to normal resting tone with minimal to no evidence of hypertonicity. - Goal met 07/16/22    Pt will be able to tolerate sexual intimacy with <2/10 pain. - Goal met 07/16/22     Pt will be able to perform activities of daily living/work related responsibilities including but not limited to child care activities and bending to clean with minimal (pain <2/10) to no pain provocation or limitations. - Goal met 07/16/22, I can hold my daughter and go outside and run, no more than 1/10 pain     Pt will be able to urinate and defecate with no pain provocation prior to, during, or following voiding. - Goal met 05/21/22, maintained 07/16/22    Pt to improve urinary frequency to every 2-4 hours during daytime and </=1x/night for reduced life disruption from excessive urination. - Goal met 05/21/22, maintained 07/16/22      Plan    Therapy options: will be seen for skilled physical therapy services    Planned therapy interventions: Bladder Retraining, Bowel Retraining, Diaphragmatic/Pursed-lip Breathing, E-Stim, Education - Patient, Education - Geophysicist/field seismologist, Endurance Activites, Functional Mobility, Home Exercise Program, Therapeutic Exercises, Therapeutic Activities, TENS, Self-Care/Home Training, Postural Training, Neuromuscular Re-education, Manual Therapy, Cryotherapy and Taping    DME Equipment: squatty potty.    Frequency: 1x week (1x/week for 12 weeks with reducing frequency as improvements are made)    Duration in weeks: 12 weeks    Education provided to: patient.    Education provided: Anatomy, Importance of Therapy, Treatment options and plan and Role of therapy in Rehabilitation  Communication/Consultation: D/C note sent to Referring Provider.    Next visit plan:          Discharge today       Treatment rendered today:      Manual Therapy x 25 min    Myofascial release using silicone instrument tool along lower abdomen, particularly LLQ, along with skin rolling and petrissage technqiues with near-full resolution of pain by end. Taught pt how to use tool on her own as she desired ways to re-create at home. Provided pt with handout of where to obtain tool and reviewed safety precautions and contraindications.      Pelvic floor examination to determine contribution to symptoms and current status.    Simultaneous review of progress thus far and long-term management strategies to maintain improvements made in PT.  _______________________________________________________________________________________________  Therapeutic Activities x 10 min    Reviewed lifting mechanics with 5 kg ball and 35 lb kettle bell. Cueing required for increased hip flexion, core engagement throughout, and breathing mechanics. Pt improved with rehearsal.   _______________________________________________________________________________________________    Total Treatment Time: 35 min      Subjective:   History of Present Condition      History of Present Condition/Chief Complaint:       H/o of pelvic pain     Date of Onset:  05/01/2019  Date is an approximation?: yes  Explanation:  Pt states pain started approx 3 years ago following a miscarriage   Subjective:     Use of interpreter services via telephone to enhance communication.     ID# [id number]    July 16, 2022: Things are improving. I have an occasional burning sensation in my intestine but massage at home that you taught me they help.    Evaluation April 30, 2022: Pt reports chronic history of pelvic pain for approximately 3 years in low belly region on the left. Pain started after a miscarriage approximately 3 years ago according to the patient. According to chart review, pt had a septic abortion in 2022 with pain following. Pain is aggravated with gas/defecation, holding in urine for too long, lifting, and bending. Pain is distractible during day but when she goes to sleep she finds it difficult to find a comfortable position. She was told when she was in [place] that she has a hernia but most recent imaging did not find evidence of a hernia. 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   Quality of life: good    Pain  Current pain rating: 2  At best pain rating: 1  At worst pain rating: 2  Location: left lower quadrant  Quality: sharp, stabbing and constant  Relieving factors: rest  Aggravating factors: lifting and bending (defecation/gas)  Pain Related Behaviors: none  Progression: no change  Red flags: disturbed sleep.    Precautions and Equipment  Precautions: None  Current Braces/Orthoses: None  Equipment Currently Used: None  Prior Functional Status:     Functional Limitation(s)-No physical limitations  Current functional status: limited lifting and limited walking tolerance  Social Support  Lives in: two story house  Lives with: spouse (daughter - 37 years old)    Communication Preference: verbal, written and visual  Barriers to Learning: No Barriers  Work/School: Pt works as a Engineer, water for offices    Diagnostic Tests    Diagnostic Test Comments:       Reviewed in epic.     Treatments  Previous treatment: medication (pain medication, muscle relaxers)  Patient Goals  Patient goals for therapy: decreased pain and improved sleep (improve ability to play/take care of her 53 yo daughter)      Objective:   Objective    The patient provided verbal consent for internal assessment and treatment, verbalized understanding of why the patient is participating in intrapelvic examination and/or treatment. The patient was given a choice between external assessment/treatment and chose an vaginal exam. The patient gave verbal permission, just prior to the examination for the vaginal examination/treatment. The patient expressed understanding that they could stop the exam/treatment at any time.     OBJECTIVE    Pelvic Floor Muscle Assessment:     External Perineum:    Introitus: Closed  Skin Condition: Normal  Vaginal Vault: Normal    Internal Assessment:    Muscle & Soft Tissue Quality:     Levator Ani (LA): R (Normal) L (Normal)  Obturator Internus (OI): R (Normal) L (Normal)    Levator Ani (LA):    LA Strength: 3.5    LA ROM:0.5      LA Specificity: Isolation    LA Coordination:   Contract: Good  Relax: Good  Bear Down: LA relax  Valsalva: No      Hep:   + butterfly stretch, cesarean scar work, and bladder scoops     Handout provided today (HEP expanded):                               I attest that I have reviewed the above information.  Signed: Marcha Dutton, PT  07/16/2022 10:58 AM

## 2022-07-18 MED ORDER — HALOBETASOL PROPIONATE 0.05 % TOPICAL OINTMENT
Freq: Two times a day (BID) | TOPICAL | 2 refills | 0.00000 days
Start: 2022-07-18 — End: ?

## 2022-07-18 MED ORDER — CLOBETASOL 0.05 % SCALP SOLUTION
Freq: Every day | TOPICAL | 5 refills | 0.00000 days
Start: 2022-07-18 — End: ?

## 2022-07-21 ENCOUNTER — Ambulatory Visit: Admit: 2022-07-21 | Discharge: 2022-07-21 | Payer: PRIVATE HEALTH INSURANCE

## 2022-07-21 ENCOUNTER — Other Ambulatory Visit: Admit: 2022-07-21 | Discharge: 2022-07-21 | Payer: PRIVATE HEALTH INSURANCE

## 2022-07-21 DIAGNOSIS — Z79899 Other long term (current) drug therapy: Principal | ICD-10-CM

## 2022-07-21 DIAGNOSIS — L409 Psoriasis, unspecified: Principal | ICD-10-CM

## 2022-07-21 MED ORDER — HALOBETASOL PROPIONATE 0.05 % TOPICAL OINTMENT
Freq: Two times a day (BID) | TOPICAL | 4 refills | 0.00000 days | Status: CP
Start: 2022-07-21 — End: 2022-07-21
  Filled 2022-08-08: qty 100, 30d supply, fill #0

## 2022-07-21 MED ORDER — CLOBETASOL 0.05 % SCALP SOLUTION
Freq: Every day | TOPICAL | 5 refills | 0.00000 days | Status: CP
Start: 2022-07-21 — End: 2022-07-21
  Filled 2022-08-08: qty 50, 30d supply, fill #0

## 2022-07-21 MED ORDER — HUMIRA PEN CITRATE FREE 40 MG/0.4 ML
SUBCUTANEOUS | 10 refills | 28.00000 days | Status: CP
Start: 2022-07-21 — End: ?
  Filled 2022-08-08: qty 2, 28d supply, fill #0

## 2022-07-21 NOTE — Unmapped (Signed)
[facility] releases most results to you as soon as they are available. Therefore, you may see some results before we do. Please give [place] 3 business days to review the tests and contact you by phone or through MyChart. If you are concerned that some results may be upsetting or confusing, you may wish to wait until we contact you before looking at the report in MyChart.   If you have an urgent question, you can send [place] a message or call our clinic. Otherwise, we prefer that you wait 3 business days for [place] to contact you.    [facility] Dermatology Clinical Team     It was nice to see you today! Your resident physician was Dr. Lenore Cordia.    If any of your medications are too expensive, look for a coupon at [email or web address] or reference the application on a smartphone  - Enter the medication name, size, and your zip code to find coupons for local pharmacies.   - Print a coupon and bring it to the pharmacy, or pull up the coupon on a smartphone.  - You can also call pharmacies to ask about the cost of your medication before you pick it up.  If you still cannot afford your medication, please let us know.     If you have any questions or concerns please call the office at [phone] or send [place] a message via MyChart and we will happily address them. We look forward to seeing you again!    [facility] releases most results to you as soon as they are available. Therefore, you may see some results before we do. Please give [place] 3 business days to review the tests and contact you by phone or through MyChart. If you are concerned that some results may be upsetting or confusing, you may wish to wait until we contact you before looking at the report in MyChart. If you have an urgent question, you can send [place] a message or call our clinic. Otherwise, we prefer that you wait 3 business days for [place] to contact you.

## 2022-07-21 NOTE — Unmapped (Signed)
Dermatology Note     Assessment and Plan:    The patient was offered the certified Spanish interpreter, but the patient declined the offered services. Proceeded with the visit in Spanish as a native [place] speaker.     Psoriasis, Plaque  Acuity: chronic, stable with minor flares  - Approximately <5% TBSA involvement  - Educated, discussed chronic nature and waxing/waning.  - Discussed association with psoriatic arthritis, monitor for increasing joint pain/stiffness, red/hot swollen fingers or joints; discussed if develops joint involvement will need referral to Rheumatology and possible escalation of therapy  - Reviewed different treatments including topical corticosteroids, and/or topical vitamin D vs systemic therapy with biologic agents (CsA, MTX, etanercept, adalimumab, ustekinumab, ixekizumab, apremilast)  - Discussed risks including possibility of steroid atrophy with long-term usage  - Discussed the risks, benefits, alternatives and complications of the use of biologic medications, such as reactivation of tuberculosis or fungal infections, liver damage, lowering of blood counts, injection site reactions, allergic reaction, development of infections, development of cancers, especially lymphomas and skin cancer.  - Joint decision was made to treat as the orders below:  - Continue clobetasoL (TEMOVATE) 0.05 % external solution; Apply topically daily. To rash on scalp as needed for rash.  - Continue halobetasol (ULTRAVATE) 0.05 % ointment; Apply topically Two (2) times a day. Onto rash on body for 2 weeks at a time as needed for rash. Stop after clearance of rash.  - Continue HUMIRA PEN CITRATE FREE 40 MG/0.4 ML; Inject 0.4 mL (40 mg total) under the skin every fourteen (14) days.    High risk medication use (adalimumab (Humira))  - Denied side effects or complications today.  - Quant gold from 08/06/21 negative  - Repeat Quantiferon TB Gold Plus; Future    The patient was advised to call for an appointment should any new, changing, or symptomatic lesions develop.     RTC: Return in about 1 year (around 07/22/2023) for In-person. or sooner as needed   _________________________________________________________________      Chief Complaint     Chief Complaint   Patient presents with    Psoriasis     MEDICATION REFILL-CLOBETASOL AND HALOBETASOL PENDED       HPI     Stephanie Hester is a 41 y.o. female who presents as a returning patient (last seen 07/03/2021) to Dermatology for follow up of Psoriasis. The patient refers her psoriasis has been under very well control with the adalimumab (Humira) and has had only very minor flares that clear with the topical steroids. She has some questions about long term use of adalimumab (Humira) and the side effects risks.    The patient denies any other new or changing lesions or areas of concern.     Pertinent Past Medical History     Problem List       Psoriasis     Currently taking Adalimumab q 2 weeks with good response. Followed by [facility]umatology-->last visit on 08/07/20. Recommendations at that time:    Psoriasis  - Patient has previously tried and failed topical steroids  - Patient is now pregnant (did have recent miscarriage prior to starting adalimumab). She was originally prescribed ustekinumab but this was denied  - Patient has been taking Adalimumab (stopped 1 dose due to new rash). Discussed that from dermatologic perspective, we are agreeable to continuing this medication during pregnancy but defer to OBGYN. Patient plans to establish care with high risk OBGYN for her pre natal care  - recommend start clobetasol  0.05% ointment bid to affected area until flat and smooth    Tumor necrosis factor (TNF)-alpha blockers may be used in women who require these medications for the maintenance or establishment of control of active inflammatory disease during pregnancy. Some expert guidance suggests these medications should be discontinued in the late second or early third trimester. However, use of these drugs can be extended, if necessary, to a later gestational age if benefits outweigh potential risks for an individual patient.     -08/07/20: Quant gold negative, HIV: non-reactive, Hep C ab: non-reactive, Hep B surf ab: non-reactive, Hep B core ab: non-reactive  -Pt is s/p 2nd dose of Moderna COVID-19 vaccination. Considering influenza vaccination at next visit (declined today).  -Will continue Adalimumab at this time  [ ]  Follow-up with dermatology on 11/13/20         Relevant Medications    clobetasoL (TEMOVATE) 0.05 % external solution    halobetasol (ULTRAVATE) 0.05 % ointment    HUMIRA PEN CITRATE FREE 40 MG/0.4 ML    Other Relevant Orders    Quantiferon TB Gold Plus     Family History:   Sister has Psoriasis c/w Psoriatic Arthritis currently on an infusion medication after failing treatment to injectables.    Past Medical History, Family History, Social History, Medication List, Allergies, and Problem List were reviewed in the rooming section of Epic.     ROS: Other than symptoms mentioned in the HPI, no fevers, chills, or other skin complaints    Physical Examination     GENERAL: Well-appearing female in no acute distress, resting comfortably.  NEURO: Alert and oriented, answers questions appropriately  PSYCH: Normal mood and affect  SKIN (Focal Skin Exam): Per patient request, examination of sun exposed extremities upper and lower including hands and feet was performed    - A few well-demarcated erythematous papules  with micaceous silvery scale on the extremities with a predilection for extensor surfaces  - Toenails L>R show some proximal nail dystrophy, evidence of nail pits, and oil drop spots.    - Hyperkeratotic yellowish scaling across the left sole of the foot   - Mild scaling across the scalp    All areas not commented on are within normal limits or unremarkable    (Approved Template 07/09/2020)

## 2022-07-22 LAB — TB AG1: TB AG1 VALUE: 0.09

## 2022-07-22 LAB — QUANTIFERON TB GOLD PLUS
QUANTIFERON ANTIGEN 1 MINUS NIL: 0.06 [IU]/mL
QUANTIFERON ANTIGEN 2 MINUS NIL: 0.06 [IU]/mL
QUANTIFERON MITOGEN: 9.97 [IU]/mL
QUANTIFERON TB GOLD PLUS: NEGATIVE
QUANTIFERON TB NIL VALUE: 0.03 [IU]/mL

## 2022-07-22 LAB — TB AG2: TB AG2 VALUE: 0.09

## 2022-07-22 LAB — TB NIL: TB NIL VALUE: 0.03

## 2022-07-22 LAB — TB MITOGEN: TB MITOGEN VALUE: 10

## 2022-07-23 NOTE — Unmapped (Signed)
Result Note:    Lab results were reviewed and found with a negative TB test. The patient was informed of the results.     Return Visit: No.  Tracking: No.  Patient Communication: MyChart

## 2022-08-01 NOTE — Unmapped (Signed)
[facility] Refill Coordination Note    Specialty Medication(s) to be Shipped:   Inflammatory Disorders: Humira    Other medication(s) to be shipped: Clobetasol ext sol and Halobetasol ointment     Stephanie Hester, DOB: 1981-08-27  Phone: [phone] (home)       All above HIPAA information was verified with patient.     Was a Nurse, learning disability used for this call? No    Completed refill call assessment today to schedule patient's medication shipment from the [facility] [phone]).  All relevant notes have been reviewed.     Specialty medication(s) and dose(s) confirmed: Regimen is correct and unchanged.   Changes to medications: Stephanie Hester reports no changes at this time.  Changes to insurance: No  New side effects reported not previously addressed with a pharmacist or physician: None reported  Questions for the pharmacist: No    Confirmed patient received a Conservation officer, historic buildings and a Surveyor, mining with first shipment. The patient will receive a drug information handout for each medication shipped and additional FDA Medication Guides as required.       DISEASE/MEDICATION-SPECIFIC INFORMATION        For patients on injectable medications: Patient currently has 0 doses left.  Next injection is scheduled for 10/20.    SPECIALTY MEDICATION ADHERENCE     Medication Adherence    Patient reported X missed doses in the last month: 0  Specialty Medication: Humira  Patient is on additional specialty medications: No  Any gaps in refill history greater than 2 weeks in the last 3 months: no  Demonstrates understanding of importance of adherence: yes  Informant: patient  Reliability of informant: reliable              Confirmed plan for next specialty medication refill: delivery by pharmacy  Refills needed for supportive medications: not needed              Were doses missed due to medication being on hold? No    Humira 40/0.4 mg/ml: 0 days of medicine on hand         REFERRAL TO PHARMACIST     Referral to the pharmacist: Not needed      [facility]     Shipping address confirmed in Epic.     Delivery Scheduled: Yes, Expected medication delivery date: 10/13.     Medication will be delivered via Same Day Courier to the prescription address in Epic WAM.    Stephanie Hester   [facility] Specialty Technician

## 2022-08-28 NOTE — Unmapped (Signed)
Patient submitted e-vist for vaginal discharge.  Also having abdominal pain and bleeding with intercourse.  Recommend in-person visit with PCP or local urgent care.  E-visit cancelled, no charge.

## 2022-09-02 NOTE — Unmapped (Signed)
[facility] Refill Coordination Note    Specialty Medication(s) to be Shipped:   Inflammatory Disorders: Humira    Other medication(s) to be shipped: No additional medications requested for fill at this time     Stephanie Hester, DOB: [birth year]  Phone: [phone] (home)       All above HIPAA information was verified with patient.     Was a Nurse, learning disability used for this call? No    Completed refill call assessment today to schedule patient's medication shipment from the [facility] [phone]).  All relevant notes have been reviewed.     Specialty medication(s) and dose(s) confirmed: Regimen is correct and unchanged.   Changes to medications: Chelan reports no changes at this time.  Changes to insurance: No  New side effects reported not previously addressed with a pharmacist or physician: None reported  Questions for the pharmacist: No    Confirmed patient received a Conservation officer, historic buildings and a Surveyor, mining with first shipment. The patient will receive a drug information handout for each medication shipped and additional FDA Medication Guides as required.       DISEASE/MEDICATION-SPECIFIC INFORMATION        For patients on injectable medications: Patient currently has 0 doses left.  Next injection is scheduled for 11/17.    SPECIALTY MEDICATION ADHERENCE     Medication Adherence    Patient reported X missed doses in the last month: 0  Specialty Medication: Humira  Patient is on additional specialty medications: No  Any gaps in refill history greater than 2 weeks in the last 3 months: no  Demonstrates understanding of importance of adherence: yes  Informant: patient  Reliability of informant: reliable              Confirmed plan for next specialty medication refill: delivery by pharmacy  Refills needed for supportive medications: not needed              Were doses missed due to medication being on hold? No    Humira 40/0.4 mg/ml: 0 days of medicine on hand REFERRAL TO PHARMACIST     Referral to the pharmacist: Not needed      [facility]     Shipping address confirmed in Epic.     Delivery Scheduled: Yes, Expected medication delivery date: 11/10.     Medication will be delivered via Same Day Courier to the prescription address in Epic WAM.    [name]   [facility] Specialty Technician

## 2022-09-05 MED FILL — HUMIRA PEN CITRATE FREE 40 MG/0.4 ML: SUBCUTANEOUS | 28 days supply | Qty: 2 | Fill #1

## 2022-09-27 LAB — URINALYSIS WITH MICROSCOPY WITH CULTURE REFLEX
BILIRUBIN UA: NEGATIVE
BLOOD UA: NEGATIVE
GLUCOSE UA: >1000 — AB
KETONES UA: NEGATIVE
LEUKOCYTE ESTERASE UA: NEGATIVE
NITRITE UA: NEGATIVE
PH UA: 5.5 (ref 5.0–9.0)
PROTEIN UA: NEGATIVE
RBC UA: <1 /HPF (ref <=4)
SPECIFIC GRAVITY UA: 1.026 (ref 1.003–1.030)
SQUAMOUS EPITHELIAL: 1 /HPF (ref 0–5)
UROBILINOGEN UA: <2
WBC UA: 1 /HPF (ref 0–5)

## 2022-09-27 LAB — COMPREHENSIVE METABOLIC PANEL
ALBUMIN: 4.1 g/dL (ref 3.4–5.0)
ALKALINE PHOSPHATASE: 117 U/L — ABNORMAL HIGH (ref 46–116)
ALT (SGPT): 31 U/L (ref 10–49)
ANION GAP: 11 mmol/L (ref 5–14)
AST (SGOT): 21 U/L (ref <=34)
BILIRUBIN TOTAL: 0.3 mg/dL (ref 0.3–1.2)
BLOOD UREA NITROGEN: 10 mg/dL (ref 9–23)
BUN / CREAT RATIO: 17
CALCIUM: 9.8 mg/dL (ref 8.7–10.4)
CHLORIDE: 109 mmol/L — ABNORMAL HIGH (ref 98–107)
CO2: 20.9 mmol/L (ref 20.0–31.0)
CREATININE: 0.6 mg/dL
EGFR CKD-EPI (2021) FEMALE: >90 mL/min/{1.73_m2} (ref >=60)
GLUCOSE RANDOM: 308 mg/dL — ABNORMAL HIGH (ref 70–179)
POTASSIUM: 3.6 mmol/L (ref 3.4–4.8)
PROTEIN TOTAL: 7.5 g/dL (ref 5.7–8.2)
SODIUM: 141 mmol/L (ref 135–145)

## 2022-09-27 LAB — CBC W/ AUTO DIFF
BASOPHILS ABSOLUTE COUNT: 0 10*9/L (ref 0.0–0.1)
BASOPHILS RELATIVE PERCENT: 0.4 %
EOSINOPHILS ABSOLUTE COUNT: 0.1 10*9/L (ref 0.0–0.5)
EOSINOPHILS RELATIVE PERCENT: 1.5 %
HEMATOCRIT: 47.3 % — ABNORMAL HIGH (ref 34.0–44.0)
HEMOGLOBIN: 15.7 g/dL — ABNORMAL HIGH (ref 11.3–14.9)
LYMPHOCYTES ABSOLUTE COUNT: 3.8 10*9/L — ABNORMAL HIGH (ref 1.1–3.6)
LYMPHOCYTES RELATIVE PERCENT: 41 %
MEAN CORPUSCULAR HEMOGLOBIN CONC: 33.2 g/dL (ref 32.0–36.0)
MEAN CORPUSCULAR HEMOGLOBIN: 28.9 pg (ref 25.9–32.4)
MEAN CORPUSCULAR VOLUME: 86.9 fL (ref 77.6–95.7)
MEAN PLATELET VOLUME: 7.7 fL (ref 6.8–10.7)
MONOCYTES ABSOLUTE COUNT: 0.8 10*9/L (ref 0.3–0.8)
MONOCYTES RELATIVE PERCENT: 8.2 %
NEUTROPHILS ABSOLUTE COUNT: 4.5 10*9/L (ref 1.8–7.8)
NEUTROPHILS RELATIVE PERCENT: 48.9 %
NUCLEATED RED BLOOD CELLS: 0 /100{WBCs} (ref <=4)
PLATELET COUNT: 299 10*9/L (ref 150–450)
RED BLOOD CELL COUNT: 5.44 10*12/L — ABNORMAL HIGH (ref 3.95–5.13)
RED CELL DISTRIBUTION WIDTH: 14.3 % (ref 12.2–15.2)
WBC ADJUSTED: 9.3 10*9/L (ref 3.6–11.2)

## 2022-09-27 LAB — HCG QUANTITATIVE, BLOOD: GONADOTROPIN, CHORIONIC (HCG) QUANT: <2.6 m[IU]/mL

## 2022-09-27 LAB — LIPASE: LIPASE: 62 U/L — ABNORMAL HIGH (ref 12–53)

## 2022-09-28 ENCOUNTER — Emergency Department
Admit: 2022-09-28 | Discharge: 2022-09-28 | Disposition: A | Discharge status: Home / Self Care | Payer: PRIVATE HEALTH INSURANCE

## 2022-09-28 ENCOUNTER — Ambulatory Visit
Admit: 2022-09-28 | Discharge: 2022-09-28 | Disposition: A | Discharge status: Home / Self Care | Payer: PRIVATE HEALTH INSURANCE

## 2022-09-28 MED ADMIN — ketorolac (TORADOL) injection 15 mg: 15 mg | INTRAVENOUS | @ 05:00:00 | Stop: 2022-09-28
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
## 2022-09-28 NOTE — Unmapped (Addendum)
Pt arrives with C/O left lower pelvic pain for 4 years today pain is worse and burning. Hurt while having sex with her husband. Vaginal bleeding and a smell. Pt also reports lower abdominal pain with lots of gas.

## 2022-09-29 ENCOUNTER — Ambulatory Visit: Admit: 2022-09-29 | Discharge: 2022-09-30 | Payer: PRIVATE HEALTH INSURANCE

## 2022-09-29 DIAGNOSIS — B353 Tinea pedis: Principal | ICD-10-CM

## 2022-09-29 DIAGNOSIS — L409 Psoriasis, unspecified: Principal | ICD-10-CM

## 2022-09-29 DIAGNOSIS — Z79899 Other long term (current) drug therapy: Principal | ICD-10-CM

## 2022-09-29 DIAGNOSIS — L739 Follicular disorder, unspecified: Principal | ICD-10-CM

## 2022-09-29 MED ORDER — CLINDAMYCIN 1 % LOTION
Freq: Two times a day (BID) | TOPICAL | 4 refills | 0.00000 days | Status: CP
Start: 2022-09-29 — End: 2023-09-29

## 2022-09-29 MED ORDER — CICLOPIROX 0.77 % TOPICAL CREAM
Freq: Two times a day (BID) | TOPICAL | 6 refills | 0.00000 days | Status: CP
Start: 2022-09-29 — End: 2023-09-29
  Filled 2022-10-03: qty 30, 20d supply, fill #0

## 2022-09-29 NOTE — Unmapped (Signed)
Dermatology Note     Assessment and Plan:    Proceeded with the visit in Spanish as a native [place] speaker and certified bilingual provider.     Plaque Psoriasis  Acuity: chronic, stable with minor flares  - Approximately <5% TBSA involvement  - Continue clobetasoL (TEMOVATE) 0.05 % external solution; Apply topically daily. To rash on scalp as needed for rash. Patient expressed that no refill(s) needed today.   - Continue halobetasol (ULTRAVATE) 0.05 % ointment; Apply topically Two (2) times a day. Onto rash on body for 2 weeks at a time as needed for rash. Stop after clearance of rash. Patient expressed that no refill(s) needed today.   - Continue HUMIRA PEN CITRATE FREE 40 MG/0.4 ML; Inject 0.4 mL (40 mg total) under the skin every fourteen (14) days. Patient expressed that no refill(s) needed today.   - Reviewed side effects of topical corticosteroids and appropriate use. Reviewed recommendation for use of clobetasol solution only as needed for flares; explained that if folliculitis flares as below to discontinue the topical steroid.     High risk medication use (adalimumab (Humira))  - Denied side effects or complications today.  - Quant gold from 07/21/22 negative.    Folliculitis  - Diagnosis, prognosis, treatment options including prescription medications, risks/benefits, and side effects were discussed with the patient. The patient made the informed decision to treat as orders below.  - start clindamycin (CLEOCIN T) 1 % lotion; Apply topically two (2) times a day. Apply to the red irritated pus bumps on the scalp until they clear. Can restart if needed.  - hold topical steroid solution for psoriasis if the folliculitis flares.    Tinea pedis of left foot  - Diagnosis, prognosis, treatment options including prescription medications, risks/benefits, and side effects were discussed with the patient. The patient made the informed decision to treat as orders below.  - A scraping of representative lesions was performed and examined microscopically using a chlorazol black preparation:  hyphae  - start ciclopirox (LOPROX) 0.77 % cream; Apply topically two (2) times a day. Gently massage into affected areas and surrounding skin. Apply to left foot and in between the toes.    The patient was advised to call for an appointment should any new, changing, or symptomatic lesions develop.     RTC: Return in about 6 months (around 03/31/2023) for In-person. or sooner as needed   _________________________________________________________________      Chief Complaint     Chief Complaint   Patient presents with    Psoriasis     New psoriasis flare on bottom pf L foot. Pt reports itching and irritation. LOV 07/21/22       HPI     Stephanie Hester is a 41 y.o. female who presents as a returning patient (last seen 07/21/2022) to Dermatology for follow up of psoriasis. The patient explained that if the     The patient denies any other new or changing lesions or areas of concern.     Pertinent Past Medical History     Plaque Psoriasis    Problem List       Psoriasis     Currently taking Adalimumab q 2 weeks with good response. Followed by [facility]umatology-->last visit on 08/07/20. Recommendations at that time:    Psoriasis  - Patient has previously tried and failed topical steroids  - Patient is now pregnant (did have recent miscarriage prior to starting adalimumab). She was originally prescribed ustekinumab but this was denied  -  Patient has been taking Adalimumab (stopped 1 dose due to new rash). Discussed that from dermatologic perspective, we are agreeable to continuing this medication during pregnancy but defer to OBGYN. Patient plans to establish care with high risk OBGYN for her pre natal care  - recommend start clobetasol 0.05% ointment bid to affected area until flat and smooth    Tumor necrosis factor (TNF)-alpha blockers may be used in women who require these medications for the maintenance or establishment of control of active inflammatory disease during pregnancy. Some expert guidance suggests these medications should be discontinued in the late second or early third trimester. However, use of these drugs can be extended, if necessary, to a later gestational age if benefits outweigh potential risks for an individual patient.     -08/07/20: Quant gold negative, HIV: non-reactive, Hep C ab: non-reactive, Hep B surf ab: non-reactive, Hep B core ab: non-reactive  -Pt is s/p 2nd dose of Moderna COVID-19 vaccination. Considering influenza vaccination at next visit (declined today).  -Will continue Adalimumab at this time  [ ]  Follow-up with dermatology on 11/13/20          Family History:   Sister has Psoriasis c/w Psoriatic Arthritis currently on an infusion medication after failing treatment to injectables.    Past Medical History, Family History, Social History, Medication List, Allergies, and Problem List were reviewed in the rooming section of Epic.     ROS: Other than symptoms mentioned in the HPI, no fevers, chills, or other skin complaints    Physical Examination     GENERAL: Well-appearing female in no acute distress, resting comfortably.  NEURO: Alert and oriented, answers questions appropriately  PSYCH: Normal mood and affect  SKIN (Focal Skin Exam): Per patient request, examination of scalp, left foot, sun exposed upper extremities and distal lower extremities was performed  -  a few well-demarcated erythematous plaques with micaceous silvery scale on the extremities   - perifollicular erythematous papules and diffuse background erythema cross the frontal scalp; otherwise white scaling diffusely across the scalp  - A scraping of representative lesions on the left foot was performed and examined microscopically using a chlorazol black preparation:  hyphae    All areas not commented on are within normal limits or unremarkable    (Approved Template 07/09/2020)

## 2022-09-30 DIAGNOSIS — L409 Psoriasis, unspecified: Principal | ICD-10-CM

## 2022-09-30 DIAGNOSIS — L739 Follicular disorder, unspecified: Principal | ICD-10-CM

## 2022-09-30 LAB — PINK EXTRA TUBE

## 2022-09-30 MED ORDER — NITROFURANTOIN MONOHYDRATE/MACROCRYSTALS 100 MG CAPSULE
ORAL_CAPSULE | Freq: Two times a day (BID) | ORAL | 0 refills | 7 days | Status: CP
Start: 2022-09-30 — End: 2022-10-07

## 2022-09-30 NOTE — Unmapped (Signed)
[facility] Refill Coordination Note    Specialty Medication(s) to be Shipped:   Inflammatory Disorders: Humira    Other medication(s) to be shipped: Clobetasol ext sol, Ciclopirox cream and Clindamycin lotion     Stephanie Hester, DOB: 1981/06/19  Phone: [phone] (home)       All above HIPAA information was verified with patient.     Was a Nurse, learning disability used for this call? No    Completed refill call assessment today to schedule patient's medication shipment from the [facility] [phone]).  All relevant notes have been reviewed.     Specialty medication(s) and dose(s) confirmed: Regimen is correct and unchanged.   Changes to medications: Stephanie Hester reports no changes at this time.  Changes to insurance: No  New side effects reported not previously addressed with a pharmacist or physician: None reported  Questions for the pharmacist: No    Confirmed patient received a Conservation officer, historic buildings and a Surveyor, mining with first shipment. The patient will receive a drug information handout for each medication shipped and additional FDA Medication Guides as required.       DISEASE/MEDICATION-SPECIFIC INFORMATION        For patients on injectable medications: Patient currently has 0 doses left.  Next injection is scheduled for 12/15.    SPECIALTY MEDICATION ADHERENCE     Medication Adherence    Patient reported X missed doses in the last month: 0  Specialty Medication: Humira  Patient is on additional specialty medications: No  Any gaps in refill history greater than 2 weeks in the last 3 months: no  Demonstrates understanding of importance of adherence: yes  Informant: patient  Reliability of informant: reliable              Confirmed plan for next specialty medication refill: delivery by pharmacy  Refills needed for supportive medications: not needed              Were doses missed due to medication being on hold? No    Humira 40/0.4 mg/ml: 0 days of medicine on hand REFERRAL TO PHARMACIST     Referral to the pharmacist: Not needed      [facility]     Shipping address confirmed in Epic.     Delivery Scheduled: Yes, Expected medication delivery date: 12/8.     Medication will be delivered via Same Day Courier to the prescription address in Epic WAM.    Stephanie Hester   [facility] Specialty Technician

## 2022-09-30 NOTE — Unmapped (Signed)
Urine Culture  Order: [id number] - Reflex for Order [id number]  Status: Final result      Patient was prescribed Macrobid for her UTI, which is susceptible. No further actions needed.

## 2022-09-30 NOTE — Unmapped (Signed)
EMAP Culture follow-up note    September 29, 2022 6:54 PM     I was contacted by the ED Resource RN with a positive culture result on Stephanie Hester. She was seen in the [facility] Emergency Department recently at which time UCx was done which has returned positive for E coli.     I have reviewed the patient's labs from this ED visit.    The ED resource nurse will contact the patient, inform her of the positive test result(s) and prescribe macrobid. Return precautions should be reviewed and she should be instructed to follow-up as directed at the time of her ED visit.

## 2022-09-30 NOTE — Unmapped (Signed)
Urine Culture  Order: [id number] - Reflex for Order [id number]  Status: Preliminary result      Clean Catch; Urine  Urine Culture, Comprehensive   50,000 to 100,000 CFU/mL Escherichia coli     Patient with a positive urine culture. Patient was not sent home on antibiotics prior to dispo from ED. EMAP messaged with results.

## 2022-10-01 DIAGNOSIS — L739 Follicular disorder, unspecified: Principal | ICD-10-CM

## 2022-10-01 MED ORDER — CLINDAMYCIN PHOSPHATE 1 % TOPICAL SOLUTION
TOPICAL | 4 refills | 0.00000 days | Status: CP
Start: 2022-10-01 — End: ?

## 2022-10-02 NOTE — Unmapped (Signed)
Patient's health insurance denied clindamycin 1% lotion, but referred coverage to clindamycin 1% external solution. Sent new prescription with covered medication substitution.

## 2022-10-03 MED FILL — HUMIRA PEN CITRATE FREE 40 MG/0.4 ML: SUBCUTANEOUS | 28 days supply | Qty: 2 | Fill #2

## 2022-10-03 MED FILL — CLOBETASOL 0.05 % SCALP SOLUTION: TOPICAL | 30 days supply | Qty: 50 | Fill #1

## 2022-10-23 ENCOUNTER — Ambulatory Visit: Admit: 2022-10-23 | Discharge: 2022-10-24 | Discharge status: Against Medical Advice | Payer: PRIVATE HEALTH INSURANCE

## 2022-10-24 ENCOUNTER — Ambulatory Visit
Admit: 2022-10-24 | Discharge: 2022-10-24 | Disposition: A | Discharge status: Home / Self Care | Payer: PRIVATE HEALTH INSURANCE | Attending: Emergency Medicine

## 2022-10-24 MED ORDER — AMOXICILLIN 875 MG-POTASSIUM CLAVULANATE 125 MG TABLET
ORAL_TABLET | Freq: Two times a day (BID) | ORAL | 0 refills | 10 days | Status: CP
Start: 2022-10-24 — End: 2022-11-03

## 2022-10-24 MED ADMIN — amoxicillin-clavulanate (AUGMENTIN) 875-125 mg per tablet 1 tablet: 1 | ORAL | @ 15:00:00 | Stop: 2022-10-24

## 2022-10-24 MED ADMIN — ketorolac (TORADOL) injection 30 mg: 30 mg | INTRAMUSCULAR | @ 15:00:00 | Stop: 2022-10-24

## 2022-10-24 MED ADMIN — acetaminophen (TYLENOL) tablet 1,000 mg: 1000 mg | ORAL | @ 08:00:00 | Stop: 2022-10-24

## 2022-10-24 MED ADMIN — oxyCODONE (ROXICODONE) immediate release tablet 5 mg: 5 mg | ORAL | @ 08:00:00 | Stop: 2022-10-24

## 2022-10-24 NOTE — Unmapped (Signed)
Pt presents with pain and numbness on L side of face.  Seen by resident, not a stroke.  Symptoms started yesterday. Tried naproxen with no relief

## 2022-10-24 NOTE — Unmapped (Signed)
Bed: 42-C  Expected date:   Expected time:   Means of arrival:   Comments:

## 2022-10-24 NOTE — Unmapped (Signed)
Here for left sided facial pain and swelling
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
## 2022-10-24 NOTE — Unmapped (Signed)
Pt reports left sided facial pain, possible sinus infection.  Green when she blows her nose.  Symptoms began yesterday.

## 2022-11-07 MED FILL — HUMIRA PEN CITRATE FREE 40 MG/0.4 ML: SUBCUTANEOUS | 28 days supply | Qty: 2 | Fill #1

## 2022-11-07 MED FILL — CLOBETASOL 0.05 % SCALP SOLUTION: TOPICAL | 30 days supply | Qty: 50 | Fill #2

## 2022-11-07 NOTE — Unmapped (Signed)
[name] Refill Coordination Note    Specialty Medication(s) to be Shipped:   Inflammatory Disorders: Humira    Other medication(s) to be shipped:  halobetasol 0.05% ointment and clobetasol 0.05% solution     Stephanie Hester, DOB: 03-24-1981  Phone: [phone] (home)       All above HIPAA information was verified with patient.     Was a Nurse, learning disability used for this call? No    Completed refill call assessment today to schedule patient's medication shipment from the [facility] [phone]).  All relevant notes have been reviewed.     Specialty medication(s) and dose(s) confirmed: Regimen is correct and unchanged.   Changes to medications: [name] reports no changes at this time.  Changes to insurance: No  New side effects reported not previously addressed with a pharmacist or physician: None reported  Questions for the pharmacist: No    Confirmed patient received a Conservation officer, historic buildings and a Surveyor, mining with first shipment. The patient will receive a drug information handout for each medication shipped and additional FDA Medication Guides as required.       DISEASE/MEDICATION-SPECIFIC INFORMATION        For patients on injectable medications: Patient currently has 0 doses left.  Next injection is scheduled for 11/07/22.    SPECIALTY MEDICATION ADHERENCE     Medication Adherence    Patient reported X missed doses in the last month: 0  Specialty Medication: HUMIRA(CF) PEN 40 mg/0.4 mL  Patient is on additional specialty medications: No  Any gaps in refill history greater than 2 weeks in the last 3 months: no  Demonstrates understanding of importance of adherence: yes  Informant: patient  Provider-estimated medication adherence level: good  Patient is at risk for Non-Adherence: No      Were doses missed due to medication being on hold?  N/A    Humira  40  mlg/0.49ml : 0 days of medicine on hand       REFERRAL TO PHARMACIST     Referral to the pharmacist: Not needed      [facility]     Shipping address confirmed in Epic.     Delivery Scheduled: Yes, Expected medication delivery date: 11/07/22 for Humira and clobetasol and 11/12/22 for halobetasol.     Medication will be delivered via  Same Day for Humira and clobetasol and UPS for halobetasol  to the prescription address in Epic WAM.    Roderic Palau, PharmD   [facility] Specialty Pharmacist

## 2022-11-11 MED FILL — HALOBETASOL PROPIONATE 0.05 % TOPICAL OINTMENT: TOPICAL | 30 days supply | Qty: 100 | Fill #1

## 2022-11-13 ENCOUNTER — Encounter: Payer: Self-pay | Admitting: Obstetrics & Gynecology

## 2022-11-13 ENCOUNTER — Ambulatory Visit (INDEPENDENT_AMBULATORY_CARE_PROVIDER_SITE_OTHER): Payer: Medicaid Other | Admitting: Obstetrics & Gynecology

## 2022-11-13 VITALS — BP 103/72 | HR 90 | Resp 15 | Ht 61.0 in | Wt 183.0 lb

## 2022-11-13 DIAGNOSIS — G8929 Other chronic pain: Secondary | ICD-10-CM | POA: Diagnosis not present

## 2022-11-13 DIAGNOSIS — R1032 Left lower quadrant pain: Secondary | ICD-10-CM

## 2022-11-13 DIAGNOSIS — Z3009 Encounter for other general counseling and advice on contraception: Secondary | ICD-10-CM

## 2022-11-13 DIAGNOSIS — Z789 Other specified health status: Secondary | ICD-10-CM

## 2022-11-13 NOTE — Progress Notes (Signed)
   Established Patient Office Visit  Subjective   Patient ID: Caroline Pollard, female    DOB: April 03, 1981  Age: 42 y.o. MRN: [id number]  Chief Complaint  Patient presents with   Gynecologic Exam    HPI   42 yo married [id number] here as a new patient, referred from [facility] for a 2 year h/o LLQ pain. She reports that she had seen a gynecologist in [facility] and was set up for a hysterectomy for this pain. However, she had to return to [place] to attend her dying mother in 2023 and missed her surgery. While in [place] she saw a gynecologist who told her that her pain was due to a "hernia". However, she had a CT and ultrasound here that have been normal. Her most recent pelvic ultrasound was at [facility] last month.  She had a colonoscopy as part of the work up for this pain in 07/2021.   She has used depo provera since her last fetal loss in 2021 so she doesn't have regular periods. She is due for her next shot next month and she gets them at [facility].   She also does not want any more pregnancies, wants permanent sterility, declines vasectomy for husband.  ROS- she denies a h/o abnormal pap smears and reports that her last one was last year.  Objective:     BP 103/72   Pulse 90   Resp 15   Ht '5\' 1"'$  (1.549 m)   Wt 183 lb (83 kg)   LMP  (Within Weeks)   BMI 34.58 kg/m    Physical Exam   An interpretor was present for this encounter.  Well nourished, well hydrated Latina, no apparent distress  Abd- benign, obese Bimanual exam- entirely normal, normal size and shape, anteverted uterus, normal adnexal exam  When asked to point to her pain she points to a spot 2 cm above her Pfannenstiel incision on the left edge. I cannot feel a hernia.  Assessment & Plan:  Chronic LLQ pain- I do not think that this is gyn in nature Unwanted fertility- she will sign Medicaid forms today and will see one of the surgeons to discuss permanent sterility  I will send her to a general  surgeon for further evaluation of the LLQ pain. Problem List Items Addressed This Visit   None   No follow-ups on file.    Emily Filbert, MD

## 2022-11-19 ENCOUNTER — Ambulatory Visit (INDEPENDENT_AMBULATORY_CARE_PROVIDER_SITE_OTHER): Payer: Medicaid Other | Admitting: Surgery

## 2022-11-19 ENCOUNTER — Encounter: Payer: Self-pay | Admitting: Surgery

## 2022-11-19 VITALS — BP 98/66 | HR 109 | Temp 98.4°F | Ht 61.0 in | Wt 185.0 lb

## 2022-11-19 DIAGNOSIS — R1032 Left lower quadrant pain: Secondary | ICD-10-CM

## 2022-11-19 NOTE — Progress Notes (Unsigned)
11/19/2022  Reason for Visit:  Left lower quadrant pain  Requesting Provider:  Clovia Cuff, MD  History of Present Illness: Caroline Pollard is a 42 y.o. female presenting for evaluation of abdominal pain in the left lower quadrant.  The patient reports that this pain is very localized, in the same area of the left lower quadrant, and more recently is also starting to radiate towards her back.  She reports this has been ongoing since about June 2021.  She reports that she has seen a few different doctors for evaluation of this.  Overall reports a history of a few different issues.  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  The first one had no issues and she passed products of conception without complications.  On the second one, she had a vacuum evacuation which was complicated by retained products of conception which required another procedure.  She reports that after that she started having pain in the left lower quadrant.  She also mentions that back then, she was also very gassy, and had "gas bomb" sensation in the left lower quadrant which would improve after passing gas.  She adjusted her diet and this did help to a degree.  She also reports feeling tightness/spasm in the left lower quadrant abdominal wall, and physical therapy has helped with this to some degree as well.  She had a colonoscopy a few years ago which found some polyps but otherwise no other abnormality and is due for repeat colonoscopy in April 2024.  Last year, she went to [place] for the passing of her mother and a doctor there told her that she had a hernia.  She has had a prior CT of abdomen/pelvis at [facility] in 05/2021 which did not see any hernias in the abdominal wall.  She reports that the area of her pain in the left lower quadrant is very localized to a specific point and not diffuse.  However, more recently the pain is starting to radiate to her back.  Denies any nausea, vomiting, or constipation, and she has a bowel  movement at least daily.  Past Medical History: Past Medical History:  Diagnosis Date   Diabetes mellitus without complication ([place])    Hyperlipidemia    Hypothyroidism      Past Surgical History: Past Surgical History:  Procedure Laterality Date   APPENDECTOMY     COLONOSCOPY WITH PROPOFOL N/A 08/02/2021   Procedure: COLONOSCOPY WITH PROPOFOL;  Surgeon: Annamaria Helling, DO;  Location: [facility] ENDOSCOPY;  Service: Gastroenterology;  Laterality: N/A;  DM - SPANISH INTERPRETER   DILATION AND CURETTAGE OF UTERUS      Home Medications: Prior to Admission medications   Medication Sig Start Date End Date Taking? Authorizing Provider  Adalimumab (HUMIRA PEN) 40 MG/0.8ML PNKT Inject 40 mg into the skin every 14 (fourteen) days.   Yes [provider]  empagliflozin (JARDIANCE) 25 MG TABS tablet Take 25 mg by mouth daily.   Yes [provider]  Hyoscyamine Sulfate SL 0.125 MG SUBL Place 1 tablet under the tongue every 6 (six) hours as needed (LLQ pain).   Yes [provider]  levothyroxine (SYNTHROID) 150 MCG tablet Take 150 mcg by mouth daily before breakfast. Take on an empty stomach with a glass of water at least 30-60 minutes before breakfast   Yes [provider]  medroxyPROGESTERone (DEPO-PROVERA) 150 MG/ML injection  12/04/21  Yes [provider]  metFORMIN (GLUCOPHAGE) 1000 MG tablet Take 1,000 mg by mouth 2 (two) times  daily with a meal.   Yes [provider]  simethicone (MYLICON) 80 MG chewable tablet Chew 80 mg by mouth every 6 (six) hours as needed for flatulence.   Yes [provider]    Allergies: No Known Allergies  Social History:  reports that she has never smoked. She has never used smokeless tobacco. She reports that she does not drink alcohol and does not use drugs.   Family History: History reviewed. No pertinent family history.  Review of Systems: Review of Systems  Constitutional:  Negative for  chills and fever.  HENT:  Negative for hearing loss.   Respiratory:  Negative for shortness of breath.   Cardiovascular:  Negative for chest pain.  Gastrointestinal:  Positive for abdominal pain. Negative for constipation, diarrhea, nausea and vomiting.  Genitourinary:  Negative for dysuria.  Musculoskeletal:  Positive for back pain.  Skin:  Negative for rash.  Neurological:  Negative for dizziness.  Psychiatric/Behavioral:  Negative for depression.     Physical Exam BP 98/66   Pulse (!) 109   Temp 98.4 F (36.9 C) (Oral)   Ht '5\' 1"'$  (1.549 m)   Wt 185 lb (83.9 kg)   SpO2 97%   BMI 34.96 kg/m  CONSTITUTIONAL: No acute distress, well nourished. HEENT:  Normocephalic, atraumatic, extraocular motion intact. NECK: Trachea is midline, and there is no jugular venous distension.  RESPIRATORY:  Lungs are clear, and breath sounds are equal bilaterally. Normal respiratory effort without pathologic use of accessory muscles. CARDIOVASCULAR: Heart is regular without murmurs, gallops, or rubs. GI: The abdomen is soft, non-distended, with very localized point of tenderness in the left lower quadrant.  On exam, there's no palpable hernia defect or any sensation of bulging during valsalva or coughing.   No left inguinal hernia.  C-section is scar is well healed and there's no palpable hernia there either.  She has an open appendectomy scar in right lower quadrant that is well healed with some contraction. MUSCULOSKELETAL:  Normal muscle strength and tone in all four extremities.  No peripheral edema or cyanosis. SKIN: Skin turgor is normal. There are no pathologic skin lesions.  NEUROLOGIC:  Motor and sensation is grossly normal.  Cranial nerves are grossly intact. PSYCH:  Alert and oriented to person, place and time. Affect is normal.  Laboratory Analysis: Labs from 09/27/22: Na 141, K 3.6, Cl 109, CO2 20.9, BUN 10, Cr 0.6.  Total bili 0.3, AST 21, ALT 31, Alk Phos 117.  Lipase 62.  WBC 9.3, Hgb  15.7, Hct 47.3, Plt 299  Imaging: CT abdomen/pelvis on 06/01/21: IMPRESSION:  - No acute intra-abdominal process. Specifically no renal stones identified. No pelvic masses identified on current study, ovaries and uterus are normal in appearance for CT. If there is continued clinical concern endovaginal ultrasound can be considered.  - Hepatic steatosis.  -There is a 0.5 cm soft tissue lesion in the anterior gallbladder fundus, too small for resolution of CT. Differential includes gallbladder polyp, adenomyomatosis, or tumefactive sludge. Further evaluation with nonemergent right upper quadrant ultrasound can be considered, as clinically warranted.   Assessment and Plan: This is a 42 y.o. female with localized left lower quadrant pain.  --Discussed with the patient that her symptoms are very varied, and could pinpoint to different potential etiologies for her pain.  The fact that it is very localized could support it being a hernia, but no hernia/bulging is palpable on exam.  The fact that she was very gassy and this improved with diet changes  and made the pain better may point towards an intestinal issue.  The fact that her pain improved with physical therapy may point to a musculoskeletal issue.  And the added history of miscarriages and the pain starting after those could point to pain of gynecological etiology, but her pelvic/endovaginal ultrasounds have not shown any abnormalities.  Overall the etiology of her pain is very unclear at this point. --Discussed with the patient that we can do a CT scan again to re-evaluate her abdomen/pelvis to see if any etiology becomes more apparent. --She will follow up with me after CT scan is done to discuss results.  Discussed with the patient that the etiology may not be surgical in nature, but the CT scan may help [place] point towards a more clear etiology.  I spent 40 minutes dedicated to the care of this patient on the date of this encounter to include pre-visit  review of records, face-to-face time with the patient discussing diagnosis and management, and any post-visit coordination of care.   Melvyn Neth, [facility]

## 2022-11-19 NOTE — Patient Instructions (Addendum)
Su TC est programada para 11/25/2022 3:00 pm (arrive by 2:45 pm) at [facility] on [employer]. Nothing to eat or drink 4 hours prior to scan. Drink 2 glasses of water prior to visit.   Si tiene alguna inquietud o pregunta, no dude en llamar a nuestra oficina.  Dolor abdominal en los adultos Abdominal Pain, Adult El dolor de [place] (abdominal) puede tener muchas causas. [place] veces, el dolor de [place] no es peligroso. Muchos de [employer] de dolor de estmago pueden controlarse y tratarse en casa. Sin embargo, a Clinical cytogeneticist, Conservation officer, historic buildings de [place] es grave. El mdico intentar descubrir la causa del dolor de [place]. Siga estas instrucciones en su casa:  Medicamentos Delphi de venta libre y los recetados solamente como se lo haya indicado el mdico. No tome medicamentos que lo ayuden a Landscape architect (laxantes), salvo que el mdico se lo indique. Instrucciones generales Est atento al dolor de estmago para Actuary cambio. Beba suficiente lquido para Contractor pis (la orina) de color amarillo plido. Concurra a todas las visitas de seguimiento como se lo haya indicado el mdico. Esto es importante. Comunquese con un mdico si: El dolor de estmago cambia o [place]. No tiene apetito o baja de peso sin proponrselo. Tiene dificultades para defecar (est estreido) o heces lquidas (diarrea) durante ms de 2 o 3 das. Siente dolor al orinar o defecar. El dolor de estmago lo despierta de noche. El dolor empeora con las comidas, despus de comer o con determinados alimentos. Tiene vmitos y no puede retener nada de lo que ingiere. Tiene fiebre. Observa sangre en la orina. Solicite ayuda de inmediato si: El dolor no desaparece en el tiempo indicado por el mdico. No puede dejar de vomitar. Siente dolor solamente en zonas especficas del abdomen, como el lado derecho o la parte inferior izquierda. Tiene heces con sangre, de color negro o con  aspecto alquitranado. Tiene dolor muy intenso en el vientre, clicos o meteorismo. Presenta signos de no tener suficientes lquidos o agua en el cuerpo (deshidratacin), por ejemplo: Elmon Else, muy escasa o falta de orina. Labios agrietados. Sequedad de boca. Ojos hundidos. Somnolencia. Debilidad. Tiene dificultad para respirar o Tourist information centre manager. Resumen Muchos de [employer] de dolor de estmago pueden controlarse y tratarse en casa. Est atento al dolor de estmago para Actuary cambio. Tome los medicamentos de venta libre y los recetados solamente como se lo haya indicado el mdico. Comunquese con un mdico si el dolor de estmago cambia o [place]. [place] ayuda de inmediato si tiene dolor muy intenso en el vientre, clicos o meteorismo. Esta informacin no tiene Marine scientist el consejo del mdico. Asegrese de hacerle al mdico cualquier pregunta que tenga. Document Revised: 04/20/2019 Document Reviewed: 04/20/2019 Elsevier Patient Education  [place].

## 2022-11-20 ENCOUNTER — Encounter: Payer: Self-pay | Admitting: Surgery

## 2022-11-25 ENCOUNTER — Ambulatory Visit: Admission: RE | Admit: 2022-11-25 | Payer: Medicaid Other | Source: Ambulatory Visit

## 2022-11-28 ENCOUNTER — Ambulatory Visit: Payer: Medicaid Other | Admitting: Surgery

## 2022-12-03 NOTE — Unmapped (Signed)
[facility] Refill Coordination Note    Specialty Medication(s) to be Shipped:   Inflammatory Disorders: Humira    Other medication(s) to be shipped: No additional medications requested for fill at this time     Stephanie Hester, DOB: 1981/03/27  Phone: [phone] (home)       All above HIPAA information was verified with patient.     Was a Nurse, learning disability used for this call? Yes, Spanish. Patient language is appropriate in [facility]    Completed refill call assessment today to schedule patient's medication shipment from the [facility] [phone]).  All relevant notes have been reviewed.     Specialty medication(s) and dose(s) confirmed: Regimen is correct and unchanged.   Changes to medications: Stephanie Hester reports no changes at this time.  Changes to insurance: No  New side effects reported not previously addressed with a pharmacist or physician: None reported  Questions for the pharmacist: No    Confirmed patient received a Conservation officer, historic buildings and a Surveyor, mining with first shipment. The patient will receive a drug information handout for each medication shipped and additional FDA Medication Guides as required.       DISEASE/MEDICATION-SPECIFIC INFORMATION        For patients on injectable medications: Patient currently has 0 doses left.  Next injection is scheduled for 12/05/2022.    SPECIALTY MEDICATION ADHERENCE     Medication Adherence    Patient reported X missed doses in the last month: 0  Specialty Medication: Humira  Patient is on additional specialty medications: No  Informant: patient                                Were doses missed due to medication being on hold? No    Humira 40/0.4 mg/ml: 0 days of medicine on hand       REFERRAL TO PHARMACIST     Referral to the pharmacist: Not needed      [facility]     Shipping address confirmed in Epic.     Delivery Scheduled: Yes, Expected medication delivery date: 12/04/2022.     Medication will be delivered via Same Day Courier to the prescription address in Epic WAM.    [name]   [facility] Specialty Technician

## 2022-12-04 MED FILL — HUMIRA PEN CITRATE FREE 40 MG/0.4 ML: SUBCUTANEOUS | 28 days supply | Qty: 2 | Fill #2

## 2022-12-15 NOTE — Progress Notes (Unsigned)
GYNECOLOGY PROGRESS NOTE  Subjective:    Patient ID: Caroline Pollard, female    DOB: [birth year], 42 y.o.   MRN: [id number]  HPI  Patient is a 42 y.o. [id number] Hispanic female who presents to discuss permanent sterilization.  She no longer desires to have any more children.   She also has some concerns about bleeding with intercourse. Patient notes that this happens each time she has intercourse. Has been occurring for ~ 7 months. She denies pain with intercourse. She also has noticed some discharge, with no odor or vaginal irritation or itching. Also notes that her she has a history of heavy menses, has been managed with Depo Provera x 2.5 years. On review of chart, patient was planning for hysterectomy in [facility] late last year, but canceled due to her mother passing away and having to travel to [place] for a short time. At the time was experiencing pelvic pain as well. Does have a h/o of PCOS. Had vaginal cultures done  in December at [facility] which were negative for vaginitis (including STD screen). [place] also done in December at [place].    GYN History:  No LMP recorded. Currently receiving injections.  Last mammogram: 05/09/2022. Results were: BIRADS 2 - Benign. Dense breasts Last pap smear: 09/03/2021.  Results were: negative.  Last colonoscopy: 08/12/2021.  Results: Adenomatous polyps   OB History  Gravida Para Term Preterm AB Living  3 1 1   2 1  $ SAB IAB Ectopic Multiple Live Births  2       1    # Outcome Date GA Lbr Len/2nd Weight Sex Delivery Anes PTL Lv  3 Term           2 SAB  [redacted]w[redacted]d        1 SAB  142w0d     FD    Past Medical History:  Diagnosis Date   Diabetes mellitus without complication (HCWelcome   Hyperlipidemia    Hypothyroidism     History reviewed. No pertinent family history.   Past Surgical History:  Procedure Laterality Date   APPENDECTOMY     COLONOSCOPY WITH PROPOFOL N/A 08/02/2021   Procedure: COLONOSCOPY WITH PROPOFOL;   Surgeon: RuAnnamaria HellingDO;  [facility];  Service: Gastroenterology;  Laterality: N/A;  DM - SPANISH INTERPRETER   DILATION AND CURETTAGE OF UTERUS      Social History   Socioeconomic History   Marital status: Married    Spouse name: Not on file   Number of children: Not on file   Years of education: Not on file   Highest education level: Not on file  Occupational History   Not on file  Tobacco Use   Smoking status: Never   Smokeless tobacco: Never  Vaping Use   Vaping Use: Never used  Substance and Sexual Activity   Alcohol use: No   Drug use: No   Sexual activity: Not on file  Other Topics Concern   Not on file  Social History Narrative   Not on file   Social Determinants of Health   Financial Resource Strain: Not on file  Food Insecurity: Not on file  Transportation Needs: Not on file  Physical Activity: Not on file  Stress: Not on file  Social Connections: Not on file  Intimate Partner Violence: Not on file    Current Outpatient Medications on File Prior to Visit  Medication Sig Dispense Refill   Adalimumab (HUMIRA PEN) 40  MG/0.8ML PNKT Inject 40 mg into the skin every 14 (fourteen) days.     empagliflozin (JARDIANCE) 25 MG TABS tablet Take 25 mg by mouth daily.     levothyroxine (SYNTHROID) 150 MCG tablet Take 150 mcg by mouth daily before breakfast. Take on an empty stomach with a glass of water at least 30-60 minutes before breakfast     medroxyPROGESTERone (DEPO-PROVERA) 150 MG/ML injection      metFORMIN (GLUCOPHAGE) 1000 MG tablet Take 1,000 mg by mouth 2 (two) times daily with a meal.     No current facility-administered medications on file prior to visit.    No Known Allergies   Review of Systems Pertinent items are noted in HPI.   Objective:   Blood pressure 94/73, pulse 87, resp. rate 16, height 5' 1"$  (1.549 m), weight 182 lb 14.4 oz (83 kg), unknown if currently breastfeeding. Body mass index is 34.56 kg/m. General  appearance: alert, cooperative, and no distress Abdomen: soft, non-tender; bowel sounds normal; no masses,  no organomegaly Pelvic: external genitalia normal, rectovaginal septum normal.  Vagina without discharge.  Cervix normal appearing, no lesions and no motion tenderness.  Uterus mobile, nontender, normal shape and size.  Adnexae non-palpable, nontender bilaterally.  Extremities: extremities normal, atraumatic, no cyanosis or edema Neurologic: Grossly normal    Imaging:  Procedure Note  Dell Ponto, MD - 09/28/2022 Formatting of this note might be different from the original. EXAM: [place] ENDOVAGINAL (NON-OB) ACCESSION: [id number]  CLINICAL INDICATION: 42 years old with LLQ pain   LMP:  04/2022  COMPARISON: Endovaginal ultrasound 10/09/2021  TECHNIQUE: Ultrasound views of the pelvis were obtained endovaginally using gray scale and color Doppler imaging. Spectral Doppler imaging was also performed.  FINDINGS:  UTERUS/CERVIX: The uterus was retroverted and measured 7.7 x 4.8 x 4.4 cm. No focal myometrial mass was seen. The endometrium was normal in thickness, measuring 0.80 cm. There is a small amount of echogenic avascular debris and trace fluid noted within the endometrial canal, possibly blood products. Consider follow-up ultrasound in 6 weeks.  OVARIES: The ovaries were seen well transvaginally. Small cystic areas were seen within both ovaries compatible with follicles. Appropriate arterial inflow and venous outflow of the ovaries was documented on color and spectral Doppler imaging. The right ovary measured 4.3 x 3.3 x 2.0 cm and the left ovary measured 2.6 x 1.7 x 2.8 cm.  OTHER: No abnormal pelvic free fluid.  IMPRESSION:  No evidence of acute ovarian torsion at time of examination.  Small fluid with echogenic debris within the endometrial canal without vascularity, probably blood products. If clinical concern for polyp persists, recommend follow-up ultrasound in 6  weeks. At time of this interpretation, patient is scheduled for gynecological follow-up.  ==================== MODIFIED REPORT: (09/28/2022 8:02 AM) This report has been modified from its preliminary version; you may check the prior versions of radiology report, results history link for prior report versions (if they were previously visible in Epic).  ----------------------------------------------- Exam End: 09/28/22 01:49   Specimen Collected: 09/28/22 01:45 Last Resulted: 09/28/22 08:02  Received From: [facility]  Result Received: 11/05/22 10:00    Assessment:   1. Postcoital bleeding   2. History of menorrhagia   3. Left lower quadrant abdominal pain   4. Depo-Provera contraceptive status   5. Type 2 diabetes mellitus without complication, without long-term current use of insulin ([place])   6. Adenomatous polyp      Plan:   1. Postcoital bleeding -Patient wants postcoital bleeding for the  past 7 months.  Has had recent workup in December at [facility] with ultrasound and vaginitis/cervicitis screening which is all been negative thus far.  Unclear cause at this time as exam with no noted cervical lesions.  Is possible that it may be breakthrough bleeding from Depo-Provera however lower on differential. - CBC; Future - TSH; Future  2. History of menorrhagia -Patient with a history of menorrhagia, currently being managed by Depo-Provera.  Patient has no further desires for childbearing, and was planning on hysterectomy sometime last years.  I revisited this discussion with her as she initially presented today for management of her undesired fertility.  I discussed her options of management by proceeding with hysterectomy versus planning for tubal ligation and utilization of different methods of management for her history of heavy cycles (including use of an IUD, endometrial ablation, or continuation with Depo-Provera).  Patient notes that she would like to revisit planning for her  hysterectomy as this would control her issues of bleeding and possibly her pain as well.  Discussed risk versus benefits of procedure, also discussed recovery period.  Will tentatively plan surgery for March 18. -Endometrial biopsy performed today as patient desires definitive management (see procedure note below).  3. Left lower quadrant abdominal pain -Patient with a history of left lower quadrant quadrant pain.  Currently without pain today.  Advised that this may improve the patient is considering hysterectomy.  4. Depo-Provera contraceptive status -Patient currently on Depo-Provera, has been on medication for approximately 2.5 years.  Is currently managing her bleeding however would desire to discontinue the medication if possible.  Desires hysterectomy.  5. Type 2 diabetes mellitus without complication, without long-term current use of insulin ([place]) -Patient with history of diabetes currently on Jardiance and metformin.  Unable to identify any recent provider notes or labs to see if diabetes is under control however recent CMP in December noted a blood sugar of over 300.  I discussed that if her blood sugars are uncontrolled, this may delay surgical intervention.  Will order labs. - TSH; Future - Hemoglobin A1c; Future  6. Adenomatous polyp Patient with a history of adenomatous polyp.  Reports that she is due to have another colonoscopy performed next month.  I discussed option of having colonoscopy performed first and then following up several weeks later with her hysterectomy, or vice versa and rescheduling her colonoscopy for later date.  Patient notes that she would prefer to have a hysterectomy performed first.  We will notify her GI provider of her decision.    Endometrial Biopsy Procedure Note  The patient is positioned on the exam table in the dorsal lithotomy position. Bimanual exam confirms uterine position and size. A Graves speculum is placed into the vagina. A single toothed  tenaculum is placed onto the anterior lip of the cervix. The pipette is placed into the endocervical canal and is advanced to the uterine fundus. Using a piston like technique, with vacuum created by withdrawing the stylus, the endometrial specimen is obtained and transferred to the biopsy container. Minimal bleeding is encountered. The procedure is well tolerated.   Uterine Position: mid    Uterine Length: 7.5 cm   Uterine Specimen:Average (with moderate blood collected in pipelle)   Post procedure instructions are given. The patient is scheduled for follow up appointment.    . A total of 60 minutes were spent during this encounter, including review of previous progress notes, recent imaging and labs, face-to-face with time with patient involving counseling and coordination of  care, as well as documentation for current visit.  Rubie Maid, MD [place]

## 2022-12-16 ENCOUNTER — Other Ambulatory Visit (HOSPITAL_COMMUNITY)
Admission: RE | Admit: 2022-12-16 | Discharge: 2022-12-16 | Disposition: A | Discharge status: Home / Self Care | Payer: Medicaid Other | Source: Ambulatory Visit | Attending: Obstetrics and Gynecology | Admitting: Obstetrics and Gynecology

## 2022-12-16 ENCOUNTER — Ambulatory Visit (INDEPENDENT_AMBULATORY_CARE_PROVIDER_SITE_OTHER): Payer: Medicaid Other | Admitting: Obstetrics and Gynecology

## 2022-12-16 ENCOUNTER — Encounter: Payer: Self-pay | Admitting: Obstetrics and Gynecology

## 2022-12-16 VITALS — BP 94/73 | HR 87 | Resp 16 | Ht 61.0 in | Wt 182.9 lb

## 2022-12-16 DIAGNOSIS — Z7984 Long term (current) use of oral hypoglycemic drugs: Secondary | ICD-10-CM

## 2022-12-16 DIAGNOSIS — E119 Type 2 diabetes mellitus without complications: Secondary | ICD-10-CM

## 2022-12-16 DIAGNOSIS — N930 Postcoital and contact bleeding: Secondary | ICD-10-CM | POA: Diagnosis not present

## 2022-12-16 DIAGNOSIS — Z3042 Encounter for surveillance of injectable contraceptive: Secondary | ICD-10-CM

## 2022-12-16 DIAGNOSIS — R1032 Left lower quadrant pain: Secondary | ICD-10-CM | POA: Diagnosis not present

## 2022-12-16 DIAGNOSIS — N939 Abnormal uterine and vaginal bleeding, unspecified: Secondary | ICD-10-CM | POA: Diagnosis not present

## 2022-12-16 DIAGNOSIS — N840 Polyp of corpus uteri: Secondary | ICD-10-CM | POA: Diagnosis not present

## 2022-12-16 DIAGNOSIS — D369 Benign neoplasm, unspecified site: Secondary | ICD-10-CM

## 2022-12-16 DIAGNOSIS — Z8742 Personal history of other diseases of the female genital tract: Secondary | ICD-10-CM

## 2022-12-17 ENCOUNTER — Other Ambulatory Visit: Payer: Medicaid Other

## 2022-12-17 DIAGNOSIS — E119 Type 2 diabetes mellitus without complications: Secondary | ICD-10-CM

## 2022-12-17 DIAGNOSIS — N930 Postcoital and contact bleeding: Secondary | ICD-10-CM

## 2022-12-18 ENCOUNTER — Encounter: Payer: Self-pay | Admitting: Obstetrics and Gynecology

## 2022-12-18 ENCOUNTER — Encounter: Payer: Self-pay | Admitting: Surgery

## 2022-12-18 LAB — CBC
Hematocrit: 48.7 % — ABNORMAL HIGH (ref 34.0–46.6)
Hemoglobin: 16.3 g/dL — ABNORMAL HIGH (ref 11.1–15.9)
MCH: 29.5 pg (ref 26.6–33.0)
MCHC: 33.5 g/dL (ref 31.5–35.7)
MCV: 88 fL (ref 79–97)
Platelets: 307 10*3/uL (ref 150–450)
RBC: 5.53 x10E6/uL — ABNORMAL HIGH (ref 3.77–5.28)
RDW: 13.7 % (ref 11.7–15.4)
WBC: 8.2 10*3/uL (ref 3.4–10.8)

## 2022-12-18 LAB — SURGICAL PATHOLOGY

## 2022-12-18 LAB — HEMOGLOBIN A1C
Est. average glucose Bld gHb Est-mCnc: 186 mg/dL
Hgb A1c MFr Bld: 8.1 % — ABNORMAL HIGH (ref 4.8–5.6)

## 2022-12-18 LAB — TSH: TSH: 6.28 u[IU]/mL — ABNORMAL HIGH (ref 0.450–4.500)

## 2022-12-19 ENCOUNTER — Telehealth: Payer: Self-pay

## 2022-12-19 NOTE — Telephone Encounter (Signed)
Pt called triage to let Dr. Marcelline Mates know the provider that handled her thyroid and diabetes meds is no longer at [facility]. She went to [facility] yesterday after she saw your message and was told once results from our office had been received, they were going to give them to another provider to be reviewed and then prescribe new meds based on results. Now she wants to know, if she has to change her pre-op date and if she has to repeat labs? And when? Wants to let you know her Medicaid ends end of April.

## 2022-12-21 NOTE — Telephone Encounter (Signed)
Please have patient's recent labs and my last note sent to [facility], and inform her that we will try to ensure that she can have her surgery prior to April, but must at least have her new medications on board for 1 month prior to surgery. We will need to push her pre-op date out until the end of March.

## 2022-12-22 NOTE — Telephone Encounter (Signed)
We can move her pre-op to the week following her colonoscopy, and plan for her surgery towards the end of April, either then 15th or 22nd, depending on her labs

## 2022-12-24 NOTE — Telephone Encounter (Signed)
Pt aware.

## 2022-12-25 NOTE — Unmapped (Signed)
[facility] Refill Coordination Note    Specialty Medication(s) to be Shipped:   Inflammatory Disorders: Humira    Other medication(s) to be shipped: No additional medications requested for fill at this time     Stephanie Hester, DOB: [birth year]  Phone: [phone] (home)       All above HIPAA information was verified with patient.     Was a Nurse, learning disability used for this call? No    Completed refill call assessment today to schedule patient's medication shipment from the [facility] [phone]).  All relevant notes have been reviewed.     Specialty medication(s) and dose(s) confirmed: Regimen is correct and unchanged.   Changes to medications: [name] reports no changes at this time.  Changes to insurance: No  New side effects reported not previously addressed with a pharmacist or physician: None reported  Questions for the pharmacist: No    Confirmed patient received a Conservation officer, historic buildings and a Surveyor, mining with first shipment. The patient will receive a drug information handout for each medication shipped and additional FDA Medication Guides as required.       DISEASE/MEDICATION-SPECIFIC INFORMATION        For patients on injectable medications: Patient currently has 0 doses left.  Next injection is scheduled for 01/02/23.    SPECIALTY MEDICATION ADHERENCE     Medication Adherence    Patient reported X missed doses in the last month: 0  Specialty Medication: HUMIRA(CF) PEN 40 mg/0.4 mL  Informant: patient              Were doses missed due to medication being on hold? No    Humira 40  mg/ 0.9mL : 8 days of medicine on hand       REFERRAL TO PHARMACIST     Referral to the pharmacist: Not needed      [facility]     Shipping address confirmed in Epic.     Patient was notified of new phone menu : No    Delivery Scheduled: Yes, Expected medication delivery date: 01/01/23.     Medication will be delivered via Same Day Courier to the prescription address in Epic WAM.    Camillo Flaming, PharmD   [facility] Specialty Pharmacist

## 2022-12-29 ENCOUNTER — Encounter: Payer: Self-pay | Admitting: Surgery

## 2022-12-29 ENCOUNTER — Ambulatory Visit (INDEPENDENT_AMBULATORY_CARE_PROVIDER_SITE_OTHER): Payer: Medicaid Other | Admitting: Surgery

## 2022-12-29 ENCOUNTER — Other Ambulatory Visit: Payer: Self-pay

## 2022-12-29 VITALS — BP 106/74 | HR 104 | Temp 98.0°F | Ht 61.0 in | Wt 183.0 lb

## 2022-12-29 DIAGNOSIS — R1032 Left lower quadrant pain: Secondary | ICD-10-CM

## 2022-12-29 DIAGNOSIS — K4090 Unilateral inguinal hernia, without obstruction or gangrene, not specified as recurrent: Secondary | ICD-10-CM

## 2022-12-29 NOTE — Patient Instructions (Addendum)
we will try to get you scheduled for a CT scan. we will let you know when this will be once we get authorization from your insurance company. Intentaremos programarle una tomografa computarizada. Le informaremos cundo ser esto una vez que obtengamos la autorizacin de su compaa de seguros.  you may use Tylenol or Ibuprofen for any discomfort. Puedes usar Tylenol o Ibuprofeno para Best boy.  we will call you with the results of the CT scan. Lo llamaremos con los resultados de la tomografa computarizada.

## 2022-12-29 NOTE — Progress Notes (Signed)
12/29/2022  History of Present Illness: Caroline Pollard is a 42 y.o. female presenting for follow up of left lower quadrant pain.  The patient was last seen on 11/19/22 for this.  Her pain was very localized initially to the left lower quadrant and the patient would feel a bulging or "gas bomb" sensation.  On exam, I could not discern a hernia defect in the left lower quadrant or the groin area.  A CT scan was ordered, but it was denied by insurance.  She presents today for follow up and to check on her symptoms.  Today, the patient reports that her pain has been worsening and spreading.  Now it's no longer located only in the left lower quadrant, but it starts there and radiates down towards the upper anterior thigh.  It is happening more frequently and she feels is more constant.  Denies any nausea, vomiting, or constipation.    To revisit her history, she reports a history of two abortions.  One was a natural abortion which evacuated on its own.  After that, she started having some discomfort in the left lower quadrant.  Later on, she had another abortion that required D&C.  However, this was complicated by retained products and required a second procedure as she was starting to get an infection.  Her pain worsened after that.  She has seen Dr. Marcelline Mates in the past and is considering hysterectomy for menorrhagia.  She also has a colonoscopy scheduled with Dr. Virgina Jock on 01/30/23.  Past Medical History: Past Medical History:  Diagnosis Date   Adenomatous colon polyp 08/12/2021   Diabetes mellitus without complication ([place])    Hyperlipidemia    Hypothyroidism    Psoriasis      Past Surgical History: Past Surgical History:  Procedure Laterality Date   APPENDECTOMY     COLONOSCOPY WITH PROPOFOL N/A 08/02/2021   Procedure: COLONOSCOPY WITH PROPOFOL;  Surgeon: Annamaria Helling, DO;  Location: [facility] ENDOSCOPY;  Service: Gastroenterology;  Laterality: N/A;  DM - SPANISH INTERPRETER    DILATION AND CURETTAGE OF UTERUS      Home Medications: Prior to Admission medications   Medication Sig Start Date End Date Taking? Authorizing Provider  Adalimumab (HUMIRA PEN) 40 MG/0.8ML PNKT Inject 40 mg into the skin every 14 (fourteen) days.   Yes [provider]  empagliflozin (JARDIANCE) 25 MG TABS tablet Take 25 mg by mouth daily.   Yes [provider]  levothyroxine (SYNTHROID) 150 MCG tablet Take 150 mcg by mouth daily before breakfast. Take on an empty stomach with a glass of water at least 30-60 minutes before breakfast   Yes [provider]  medroxyPROGESTERone (DEPO-PROVERA) 150 MG/ML injection  12/04/21  Yes [provider]  metFORMIN (GLUCOPHAGE) 1000 MG tablet Take 1,000 mg by mouth 2 (two) times daily with a meal.   Yes [provider]    Allergies: No Known Allergies  Review of Systems: Review of Systems  Constitutional:  Negative for chills and fever.  Respiratory:  Negative for shortness of breath.   Cardiovascular:  Negative for chest pain.  Gastrointestinal:  Positive for abdominal pain. Negative for constipation, nausea and vomiting.    Physical Exam BP 106/74   Pulse (!) 104   Temp 98 F (36.7 C)   Ht '5\' 1"'$  (1.549 m)   Wt 183 lb (83 kg)   LMP 07/11/2020 Comment: Pregnancy test negative  SpO2 98%   BMI 34.58 kg/m  CONSTITUTIONAL: No acute distress, well nourished. HEENT:  Normocephalic, atraumatic, extraocular motion intact. RESPIRATORY:  Lungs are clear, and breath sounds are equal bilaterally. Normal respiratory effort without pathologic use of accessory muscles. CARDIOVASCULAR: Heart is regular without murmurs, gallops, or rubs. GI: The abdomen is soft, non-distended, with some discomfort/tenderness to palpation in the left lower quadrant, near an incision for c-section.  No palpable bulging or mass.  No inguinal hernia palpable on the left side either. NEUROLOGIC:  Motor and sensation is grossly normal.   Cranial nerves are grossly intact. PSYCH:  Alert and oriented to person, place and time. Affect is normal.   Assessment and Plan: This is a 43 y.o. female with left lower quadrant abdominal pain, radiating towards left upper thigh.  --Discussed with the patient that on today's exam, I am still unable to feel a discrete hernia defect that would explain the patient's etiology.  However, she reports that her pain is progressing and now radiates towards her left thigh and is also more frequent/constant.  Given that this is changing/progressing, I think even more reason to obtain a CT scan to further evaluate.  Will place order again with hopes that it gets approved this time. --Will contact the patient with the results.  I spent 30 minutes dedicated to the care of this patient on the date of this encounter to include pre-visit review of records, face-to-face time with the patient discussing diagnosis and management, and any post-visit coordination of care.   Melvyn Neth, [facility]

## 2023-01-01 MED FILL — HUMIRA PEN CITRATE FREE 40 MG/0.4 ML: SUBCUTANEOUS | 28 days supply | Qty: 2 | Fill #3

## 2023-01-08 ENCOUNTER — Encounter: Payer: Medicaid Other | Admitting: Obstetrics and Gynecology

## 2023-01-09 ENCOUNTER — Other Ambulatory Visit: Payer: Self-pay | Admitting: Surgery

## 2023-01-19 ENCOUNTER — Ambulatory Visit: Payer: Medicaid Other

## 2023-01-21 ENCOUNTER — Encounter: Payer: Self-pay | Admitting: Obstetrics and Gynecology

## 2023-01-22 NOTE — Telephone Encounter (Signed)
The patient is scheduled for 4/18 with Dr. Marcelline Mates for pre op.

## 2023-01-23 NOTE — Unmapped (Signed)
[facility] Refill Coordination Note    Specialty Medication(s) to be Shipped:   Inflammatory Disorders: Humira    Other medication(s) to be shipped: No additional medications requested for fill at this time     Stephanie Hester, DOB: 07-07-1981  Phone: [phone] (home)       All above HIPAA information was verified with patient.     Was a Nurse, learning disability used for this call? No    Completed refill call assessment today to schedule patient's medication shipment from the [facility] [phone]).  All relevant notes have been reviewed.     Specialty medication(s) and dose(s) confirmed: Regimen is correct and unchanged.   Changes to medications: Stephanie Hester reports no changes at this time.  Changes to insurance: No  New side effects reported not previously addressed with a pharmacist or physician: None reported  Questions for the pharmacist: No    Confirmed patient received a Conservation officer, historic buildings and a Surveyor, mining with first shipment. The patient will receive a drug information handout for each medication shipped and additional FDA Medication Guides as required.       DISEASE/MEDICATION-SPECIFIC INFORMATION        For patients on injectable medications: Patient currently has 0 doses left.  Next injection is scheduled for 01/30/2023.    SPECIALTY MEDICATION ADHERENCE     Medication Adherence    Patient reported X missed doses in the last month: 0  Specialty Medication: Humira  Patient is on additional specialty medications: No  Informant: patient              Were doses missed due to medication being on hold? No    Humira 40/0.4 mg/ml: 0 days of medicine on hand       REFERRAL TO PHARMACIST     Referral to the pharmacist: Not needed      [facility]     Shipping address confirmed in Epic.     Delivery Scheduled: Yes, Expected medication delivery date: 01/29/2023.     Medication will be delivered via Same Day Courier to the prescription address in Epic WAM.    [name]   [facility] Specialty Technician

## 2023-01-29 ENCOUNTER — Encounter: Payer: Self-pay | Admitting: Gastroenterology

## 2023-01-29 ENCOUNTER — Telehealth: Payer: Self-pay

## 2023-01-29 MED FILL — HUMIRA PEN CITRATE FREE 40 MG/0.4 ML: SUBCUTANEOUS | 28 days supply | Qty: 2 | Fill #4

## 2023-01-29 NOTE — Telephone Encounter (Signed)
Spoke with the patient and let her know that we finally got approval from her insurance company for her CT scan. The patient is scheduled for a CT scan of the pelvis with contrast at [facility] on 02/09/23. She will arrive at the medical mall entrance and report to the radiology desk at 8:45 am. The patient is aware of date, time,and instructions.   Patient is aware that we will call with results.

## 2023-01-29 NOTE — H&P (Signed)
Pre-Procedure H&P   Patient ID: Caroline Pollard is a 42 y.o. female.  Gastroenterology Provider: Jaynie Collins, DO  Referring Provider: Jacob Moores, PA PCP: Sandrea Hughs, NP  Date: 01/30/2023  HPI Caroline Pollard is a 42 y.o. female who presents today for Colonoscopy for Surveillance-personal history of colon polyps.  I spoke with patient- video medical spanish interpreter available, however, patient declined as she felt comfortable with her understanding of the procedure and risks in [place].  Patient underwent colonoscopy in October 2022 with noted right-sided: Poor preparation.  She was found to have a 6 to 7 mm ascending colon polyp that was adenomatous at that time.  TI was normal.  While her abdominal pain has improved her pelvic pain remains present and she is following with gynecology for this.  She still notes bowel movements daily to every other day with straining and incomplete clearance - improved.  No melena or hematochezia.  She found that stopping her stimulant laxative helped with the abdominal pain.  She is status post appendectomy.  Pelvic floor therapy has been helping her pelvic pain  On Victoza- last dose on Saturday 01/24/23  Most recent lab work hemoglobin 16.3 MCV 88 platelets 307,000  On Humira for rheumatoid arthritis and psoriasis   Past Medical History:  Diagnosis Date   Adenomatous colon polyp 08/12/2021   Diabetes mellitus without complication    Hyperlipidemia    Hypothyroidism    Psoriasis     Past Surgical History:  Procedure Laterality Date   APPENDECTOMY     COLONOSCOPY WITH PROPOFOL N/A 08/02/2021   Procedure: COLONOSCOPY WITH PROPOFOL;  Surgeon: Jaynie Collins, DO;  Location: [facility] ENDOSCOPY;  Service: Gastroenterology;  Laterality: N/A;  DM - SPANISH INTERPRETER   DILATION AND CURETTAGE OF UTERUS      Family History No h/o GI disease or malignancy  Review of Systems  Constitutional:  Negative  for activity change, appetite change, chills, diaphoresis, fatigue, fever and unexpected weight change.  HENT:  Negative for trouble swallowing and voice change.   Respiratory:  Negative for shortness of breath and wheezing.   Cardiovascular:  Negative for chest pain, palpitations and leg swelling.  Gastrointestinal:  Negative for abdominal distention, abdominal pain, anal bleeding, blood in stool, constipation, diarrhea, nausea, rectal pain and vomiting.       +straining, incomplete clearance  Genitourinary:  Positive for pelvic pain.  Musculoskeletal:  Negative for arthralgias and myalgias.  Skin:  Negative for color change and pallor.  Neurological:  Negative for dizziness, syncope and weakness.  Psychiatric/Behavioral:  Negative for confusion.   All other systems reviewed and are negative.    Medications No current facility-administered medications on file prior to encounter.   Current Outpatient Medications on File Prior to Encounter  Medication Sig Dispense Refill   Adalimumab (HUMIRA PEN) 40 MG/0.8ML PNKT Inject 40 mg into the skin every 14 (fourteen) days.     empagliflozin (JARDIANCE) 25 MG TABS tablet Take 25 mg by mouth daily.     levothyroxine (SYNTHROID) 150 MCG tablet Take 150 mcg by mouth daily before breakfast. Take on an empty stomach with a glass of water at least 30-60 minutes before breakfast     liraglutide (VICTOZA) 18 MG/3ML SOPN Inject into the skin daily.     metFORMIN (GLUCOPHAGE) 1000 MG tablet Take 1,000 mg by mouth 2 (two) times daily with a meal.      Pertinent medications related to GI and procedure were reviewed by me  with the patient prior to the procedure   Current Facility-Administered Medications:    0.9 %  sodium chloride infusion, , Intravenous, Continuous, Jaynie Collins, DO      No Known Allergies Allergies were reviewed by me prior to the procedure  Objective   Body mass index is 35.74 kg/m. Vitals:   01/30/23 0927  BP: 104/75   Pulse: 79  Resp: 16  Temp: (!) 97.1 F (36.2 C)  TempSrc: Temporal  SpO2: 100%  Weight: 83 kg  Height: 5' (1.524 m)     Physical Exam Vitals and nursing note reviewed.  Constitutional:      General: She is not in acute distress.    Appearance: Normal appearance. She is obese. She is not ill-appearing, toxic-appearing or diaphoretic.  HENT:     Head: Normocephalic and atraumatic.     Nose: Nose normal.     Mouth/Throat:     Mouth: Mucous membranes are moist.     Pharynx: Oropharynx is clear.  Eyes:     General: No scleral icterus.    Extraocular Movements: Extraocular movements intact.  Cardiovascular:     Rate and Rhythm: Normal rate and regular rhythm.     Heart sounds: Normal heart sounds. No murmur heard.    No friction rub. No gallop.  Pulmonary:     Effort: Pulmonary effort is normal. No respiratory distress.     Breath sounds: Normal breath sounds. No wheezing, rhonchi or rales.  Abdominal:     General: Bowel sounds are normal. There is no distension.     Palpations: Abdomen is soft.     Tenderness: There is no abdominal tenderness. There is no guarding or rebound.  Musculoskeletal:     Cervical back: Neck supple.     Right lower leg: No edema.     Left lower leg: No edema.  Skin:    General: Skin is warm and dry.     Coloration: Skin is not jaundiced or pale.  Neurological:     General: No focal deficit present.     Mental Status: She is alert and oriented to person, place, and time. Mental status is at baseline.  Psychiatric:        Mood and Affect: Mood normal.        Behavior: Behavior normal.        Thought Content: Thought content normal.        Judgment: Judgment normal.      Assessment:  Caroline Pollard is a 42 y.o. female  who presents today for Colonoscopy for Surveillance-personal history of colon polyps .  Plan:  Colonoscopy with possible intervention today  Colonoscopy with possible biopsy, control of bleeding,  polypectomy, and interventions as necessary has been discussed with the patient/patient representative. Informed consent was obtained from the patient/patient representative after explaining the indication, nature, and risks of the procedure including but not limited to death, bleeding, perforation, missed neoplasm/lesions, cardiorespiratory compromise, and reaction to medications. Opportunity for questions was given and appropriate answers were provided. Patient/patient representative has verbalized understanding is amenable to undergoing the procedure.   Jaynie Collins, DO  [facility]  Portions of the record may have been created with voice recognition software. Occasional wrong-word or 'sound-a-like' substitutions may have occurred due to the inherent limitations of voice recognition software.  Read the chart carefully and recognize, using context, where substitutions may have occurred.

## 2023-01-30 ENCOUNTER — Ambulatory Visit
Admission: RE | Admit: 2023-01-30 | Discharge: 2023-01-30 | Disposition: A | Discharge status: Home / Self Care | Payer: Medicaid Other | Attending: Gastroenterology | Admitting: Gastroenterology

## 2023-01-30 ENCOUNTER — Encounter: Payer: Self-pay | Admitting: Gastroenterology

## 2023-01-30 ENCOUNTER — Ambulatory Visit: Payer: Medicaid Other | Admitting: Certified Registered"

## 2023-01-30 ENCOUNTER — Encounter
Admission: RE | Disposition: A | Discharge status: Home / Self Care | Payer: Self-pay | Source: Home / Self Care | Attending: Gastroenterology

## 2023-01-30 ENCOUNTER — Other Ambulatory Visit: Payer: Self-pay

## 2023-01-30 DIAGNOSIS — K640 First degree hemorrhoids: Secondary | ICD-10-CM | POA: Diagnosis not present

## 2023-01-30 DIAGNOSIS — Z79890 Hormone replacement therapy (postmenopausal): Secondary | ICD-10-CM | POA: Diagnosis not present

## 2023-01-30 DIAGNOSIS — Z7984 Long term (current) use of oral hypoglycemic drugs: Secondary | ICD-10-CM | POA: Diagnosis not present

## 2023-01-30 DIAGNOSIS — Z7985 Long-term (current) use of injectable non-insulin antidiabetic drugs: Secondary | ICD-10-CM | POA: Diagnosis not present

## 2023-01-30 DIAGNOSIS — M069 Rheumatoid arthritis, unspecified: Secondary | ICD-10-CM | POA: Diagnosis not present

## 2023-01-30 DIAGNOSIS — L409 Psoriasis, unspecified: Secondary | ICD-10-CM | POA: Insufficient documentation

## 2023-01-30 DIAGNOSIS — E119 Type 2 diabetes mellitus without complications: Secondary | ICD-10-CM | POA: Insufficient documentation

## 2023-01-30 DIAGNOSIS — Z6835 Body mass index (BMI) 35.0-35.9, adult: Secondary | ICD-10-CM | POA: Insufficient documentation

## 2023-01-30 DIAGNOSIS — E039 Hypothyroidism, unspecified: Secondary | ICD-10-CM | POA: Insufficient documentation

## 2023-01-30 DIAGNOSIS — Z86010 Personal history of colonic polyps: Secondary | ICD-10-CM | POA: Insufficient documentation

## 2023-01-30 DIAGNOSIS — Z09 Encounter for follow-up examination after completed treatment for conditions other than malignant neoplasm: Secondary | ICD-10-CM | POA: Insufficient documentation

## 2023-01-30 DIAGNOSIS — K635 Polyp of colon: Secondary | ICD-10-CM | POA: Diagnosis not present

## 2023-01-30 DIAGNOSIS — E785 Hyperlipidemia, unspecified: Secondary | ICD-10-CM | POA: Diagnosis not present

## 2023-01-30 HISTORY — PX: COLONOSCOPY: SHX5424

## 2023-01-30 SURGERY — COLONOSCOPY
Anesthesia: General

## 2023-01-30 MED ORDER — SODIUM CHLORIDE 0.9 % IV SOLN: INTRAVENOUS | Status: DC

## 2023-01-30 MED ORDER — PHENYLEPHRINE HCL (PRESSORS) 10 MG/ML IV SOLN
INTRAVENOUS | Status: DC | PRN
  Administered 2023-01-30 (×2): 80 ug via INTRAVENOUS

## 2023-01-30 MED ORDER — LIDOCAINE HCL (CARDIAC) PF 100 MG/5ML IV SOSY
PREFILLED_SYRINGE | INTRAVENOUS | Status: DC | PRN
  Administered 2023-01-30: 100 mg via INTRAVENOUS

## 2023-01-30 MED ORDER — PROPOFOL 10 MG/ML IV BOLUS
INTRAVENOUS | Status: DC | PRN
  Administered 2023-01-30: 150 ug/kg/min via INTRAVENOUS
  Administered 2023-01-30: 30 mg via INTRAVENOUS

## 2023-01-30 NOTE — Anesthesia Postprocedure Evaluation (Signed)
Anesthesia Post Note  Patient: Caroline Pollard  Procedure(s) Performed: COLONOSCOPY  Patient location during evaluation: PACU Anesthesia Type: General Level of consciousness: awake and oriented Pain management: satisfactory to patient Vital Signs Assessment: post-procedure vital signs reviewed and stable Respiratory status: spontaneous breathing and respiratory function stable Cardiovascular status: blood pressure returned to baseline Anesthetic complications: no   There were no known notable events for this encounter.   Last Vitals:  Vitals:   01/30/23 0927 01/30/23 1027  BP: 104/75 (!) 128/99  Pulse: 79 80  Resp: 16 14  Temp: (!) 36.2 C (!) 36.3 C  SpO2: 100% 99%    Last Pain:  Vitals:   01/30/23 1027  TempSrc: Temporal  PainSc: 0-No pain                 VAN STAVEREN,Patrcia Schnepp

## 2023-01-30 NOTE — Interval H&P Note (Signed)
History and Physical Interval Note: Preprocedure H&P from 01/30/23  was reviewed and there was no interval change after seeing and examining the patient.  Written consent was obtained from the patient after discussion of risks, benefits, and alternatives. Patient has consented to proceed with Colonoscopy with possible intervention   01/30/2023 9:47 AM  Caroline Pollard  has presented today for surgery, with the diagnosis of History of adenomatous polyp of colon (Z86.010) Abdominal pain, chronic, left lower quadrant (R10.32,G89.29) Chronic constipation (K59.09) Chronic pelvic pain in female (R10.2,G89.29).  The various methods of treatment have been discussed with the patient and family. After consideration of risks, benefits and other options for treatment, the patient has consented to  Procedure(s) with comments: COLONOSCOPY (N/A) - SPANISH INTERPRETER as a surgical intervention.  The patient's history has been reviewed, patient examined, no change in status, stable for surgery.  I have reviewed the patient's chart and labs.  Questions were answered to the patient's satisfaction.     Caroline Pollard

## 2023-01-30 NOTE — Op Note (Signed)
[facility] Gastroenterology Patient Name: Caroline Pollard Procedure Date: 01/30/2023 9:43 AM MRN: [id number] Account #: [id number] Date of Birth: January 22, 1981 Admit Type: Outpatient Age: 42 Room: [facility] ENDO ROOM 1 Gender: Female Note Status: Finalized Instrument Name: Colonscope [id number] Procedure:             Colonoscopy Indications:           High risk colon cancer surveillance: Personal history                         of colonic polyps Providers:             Jaynie Collins DO, DO Referring MD:          Dorcas Mcmurray (Referring MD) Medicines:             Monitored Anesthesia Care Complications:         No immediate complications. Estimated blood loss:                         Minimal. Procedure:             Pre-Anesthesia Assessment:                        - Prior to the procedure, a History and Physical was                         performed, and patient medications and allergies were                         reviewed. The patient is competent. The risks and                         benefits of the procedure and the sedation options and                         risks were discussed with the patient. All questions                         were answered and informed consent was obtained.                         Patient identification and proposed procedure were                         verified by the physician, the nurse, the anesthetist                         and the technician in the endoscopy suite. Mental                         Status Examination: alert and oriented. Airway                         Examination: normal oropharyngeal airway and neck                         mobility. Respiratory Examination: clear to  auscultation. CV Examination: RRR, no murmurs, no S3                         or S4. Prophylactic Antibiotics: The patient does not                         require prophylactic antibiotics. Prior                          Anticoagulants: The patient has taken no anticoagulant                         or antiplatelet agents. ASA Grade Assessment: II - A                         patient with mild systemic disease. After reviewing                         the risks and benefits, the patient was deemed in                         satisfactory condition to undergo the procedure. The                         anesthesia plan was to use monitored anesthesia care                         (MAC). Immediately prior to administration of                         medications, the patient was re-assessed for adequacy                         to receive sedatives. The heart rate, respiratory                         rate, oxygen saturations, blood pressure, adequacy of                         pulmonary ventilation, and response to care were                         monitored throughout the procedure. The physical                         status of the patient was re-assessed after the                         procedure.                        After obtaining informed consent, the colonoscope was                         passed under direct vision. Throughout the procedure,                         the patient's blood pressure, pulse, and oxygen  saturations were monitored continuously. The                         Colonoscope was introduced through the anus and                         advanced to the the terminal ileum, with                         identification of the appendiceal orifice and IC                         valve. The colonoscopy was performed without                         difficulty. The patient tolerated the procedure well.                         The quality of the bowel preparation was evaluated                         using the BBPS [facility] Bowel Preparation Scale) with                         scores of: Right Colon = 2 (minor amount of residual                         staining, small  fragments of stool and/or opaque                         liquid, but mucosa seen well), Transverse Colon = 3                         (entire mucosa seen well with no residual staining,                         small fragments of stool or opaque liquid) and Left                         Colon = 3 (entire mucosa seen well with no residual                         staining, small fragments of stool or opaque liquid).                         The total BBPS score equals 8. The quality of the                         bowel preparation was excellent. The terminal ileum,                         ileocecal valve, appendiceal orifice, and rectum were                         photographed. Findings:      The perianal and digital rectal examinations were normal. Pertinent       negatives include normal sphincter tone.  The terminal ileum appeared normal. Estimated blood loss: none.      Retroflexion in the right colon was performed.      Two sessile polyps were found in the sigmoid colon. The polyps were 1 mm       in size. These polyps were removed with a cold biopsy forceps. Resection       and retrieval were complete. Estimated blood loss was minimal.      Non-bleeding internal hemorrhoids were found during retroflexion. The       hemorrhoids were Grade I (internal hemorrhoids that do not prolapse).       Estimated blood loss: none.      The exam was otherwise without abnormality on direct and retroflexion       views. Impression:            - The examined portion of the ileum was normal.                        - Two 1 mm polyps in the sigmoid colon, removed with a                         cold biopsy forceps. Resected and retrieved.                        - Non-bleeding internal hemorrhoids.                        - The examination was otherwise normal on direct and                         retroflexion views. Recommendation:        - Patient has a contact number available for                          emergencies. The signs and symptoms of potential                         delayed complications were discussed with the patient.                         Return to normal activities tomorrow. Written                         discharge instructions were provided to the patient.                        - Discharge patient to home.                        - Resume previous diet.                        - Continue present medications.                        - Await pathology results.                        - Repeat colonoscopy for surveillance based on  pathology results.                        - Return to referring physician as previously                         scheduled.                        - The findings and recommendations were discussed with                         the patient. Procedure Code(s):     --- Professional ---                        [phone] [id number], Colonoscopy, flexible; with biopsy, single or                         multiple Diagnosis Code(s):     --- Professional ---                        Z86.010, Personal history of colonic polyps                        K64.0, First degree hemorrhoids                        D12.5, Benign neoplasm of sigmoid colon CPT copyright 2022 American Medical Association. All rights reserved. The codes documented in this report are preliminary and upon coder review may  be revised to meet current compliance requirements. Attending Participation:      I personally performed the entire procedure. Elfredia NevinsSteven Saryiah Bencosme, DO Jaynie CollinsSteven Michael Zenas Santa DO, DO 01/30/2023 10:26:26 AM This report has been signed electronically. Number of Addenda: 0 Note Initiated On: 01/30/2023 9:43 AM Scope Withdrawal Time: 0 hours 13 minutes 54 seconds  Total Procedure Duration: 0 hours 16 minutes 38 seconds  Estimated Blood Loss:  Estimated blood loss was minimal.      [facility]

## 2023-01-30 NOTE — Transfer of Care (Signed)
Immediate Anesthesia Transfer of Care Note  Patient: Caroline Pollard  Procedure(s) Performed: COLONOSCOPY  Patient Location: PACU  Anesthesia Type:General  Level of Consciousness: awake, alert , and oriented  Airway & Oxygen Therapy: Patient Spontanous Breathing  Post-op Assessment: Report given to RN and Post -op Vital signs reviewed and stable  Post vital signs: stable  Last Vitals:  Vitals Value Taken Time  BP 128/99 01/30/23 1028  Temp    Pulse 75 01/30/23 1028  Resp 18 01/30/23 1028  SpO2 100 % 01/30/23 1028  Vitals shown include unvalidated device data.  Last Pain:  Vitals:   01/30/23 0927  TempSrc: Temporal  PainSc: 0-No pain         Complications: No notable events documented.

## 2023-01-30 NOTE — Anesthesia Preprocedure Evaluation (Signed)
Anesthesia Evaluation  Patient identified by MRN, date of birth, ID band Patient awake    Reviewed: Allergy & Precautions, NPO status , Patient's Chart, lab work & pertinent test results  History of Anesthesia Complications (+) history of anesthetic complications  Airway Mallampati: II  TM Distance: >3 FB Neck ROM: full    Dental  (+) Teeth Intact   Pulmonary neg pulmonary ROS   Pulmonary exam normal        Cardiovascular Exercise Tolerance: Good negative cardio ROS Normal cardiovascular exam Rhythm:Regular Rate:Normal     Neuro/Psych negative neurological ROS  negative psych ROS   GI/Hepatic negative GI ROS, Neg liver ROS,,,  Endo/Other  negative endocrine ROSdiabetes, Type 2, Oral Hypoglycemic AgentsHypothyroidism  Morbid obesity  Renal/GU negative Renal ROS  negative genitourinary   Musculoskeletal   Abdominal  (+) + obese  Peds negative pediatric ROS (+)  Hematology negative hematology ROS (+)   Anesthesia Other Findings Past Medical History: 08/12/2021: Adenomatous colon polyp No date: Diabetes mellitus without complication No date: Hyperlipidemia No date: Hypothyroidism No date: Psoriasis  Past Surgical History: No date: APPENDECTOMY 08/02/2021: COLONOSCOPY WITH PROPOFOL; N/A     Comment:  Procedure: COLONOSCOPY WITH PROPOFOL;  Surgeon: Jaynie Collins, DO;  Location: [facility] ENDOSCOPY;  Service:               Gastroenterology;  Laterality: N/A;  DM - SPANISH               INTERPRETER No date: DILATION AND CURETTAGE OF UTERUS     Reproductive/Obstetrics negative OB ROS                             Anesthesia Physical Anesthesia Plan  ASA: 2  Anesthesia Plan: General   Post-op Pain Management:    Induction: Intravenous  PONV Risk Score and Plan: Propofol infusion and TIVA  Airway Management Planned: Natural Airway  Additional Equipment:    Intra-op Plan:   Post-operative Plan:   Informed Consent: I have reviewed the patients History and Physical, chart, labs and discussed the procedure including the risks, benefits and alternatives for the proposed anesthesia with the patient or authorized representative who has indicated his/her understanding and acceptance.     Dental Advisory Given  Plan Discussed with: CRNA and Surgeon  Anesthesia Plan Comments:        Anesthesia Quick Evaluation

## 2023-02-02 ENCOUNTER — Encounter: Payer: Self-pay | Admitting: Gastroenterology

## 2023-02-02 LAB — SURGICAL PATHOLOGY

## 2023-02-03 ENCOUNTER — Encounter: Payer: Medicaid Other | Admitting: Obstetrics and Gynecology

## 2023-02-06 ENCOUNTER — Encounter
Admission: RE | Admit: 2023-02-06 | Discharge: 2023-02-06 | Disposition: A | Discharge status: Home / Self Care | Payer: Medicaid Other | Source: Ambulatory Visit | Attending: Obstetrics and Gynecology | Admitting: Obstetrics and Gynecology

## 2023-02-06 ENCOUNTER — Other Ambulatory Visit: Payer: Self-pay

## 2023-02-06 DIAGNOSIS — Z01812 Encounter for preprocedural laboratory examination: Secondary | ICD-10-CM

## 2023-02-06 DIAGNOSIS — E1100 Type 2 diabetes mellitus with hyperosmolarity without nonketotic hyperglycemic-hyperosmolar coma (NKHHC): Secondary | ICD-10-CM

## 2023-02-06 DIAGNOSIS — K4090 Unilateral inguinal hernia, without obstruction or gangrene, not specified as recurrent: Secondary | ICD-10-CM

## 2023-02-06 DIAGNOSIS — R1032 Left lower quadrant pain: Secondary | ICD-10-CM

## 2023-02-06 DIAGNOSIS — Z01810 Encounter for preprocedural cardiovascular examination: Secondary | ICD-10-CM

## 2023-02-06 HISTORY — DX: Anxiety disorder, unspecified: F41.9

## 2023-02-06 HISTORY — DX: Personal history of urinary calculi: Z87.442

## 2023-02-06 NOTE — Patient Instructions (Addendum)
Your procedure is scheduled on: 02/16/23 - Monday Report to the Registration Desk on the 1st floor of the [facility]. To find out your arrival time, please call [phone] between 1PM - 3PM on: 02/13/23 - Friday If your arrival time is 6:00 am, do not arrive before that time as the [facility] entrance doors do not open until 6:00 am.  REMEMBER: Instructions that are not followed completely may result in serious medical risk, up to and including death; or upon the discretion of your surgeon and anesthesiologist your surgery may need to be rescheduled.  Do not eat food after midnight the night before surgery.  No gum chewing or hard candies.  You may however, drink CLEAR liquids up to 2 hours before you are scheduled to arrive for your surgery. Do not drink anything within 2 hours of your scheduled arrival time.  Clear liquids include: - water   One week prior to surgery: Stop Anti-inflammatories (NSAIDS) such as Advil, Aleve, Ibuprofen, Motrin, Naproxen, Naprosyn and Aspirin based products such as Excedrin, Goody's Powder, BC Powder.  Stop ANY OVER THE COUNTER supplements until after surgery.  You may take Tylenol if needed for pain up until the day of surgery.  Continue taking all prescribed medications with the exception of the following:  empagliflozin (JARDIANCE) stop taking beginning 02/13/23. HOLD liraglutide (VICTOZA) for 7 days prior to your procedure beginning 03/11/23. metFORMIN (GLUCOPHAGE) stop taking beginning 02/14/23. HOLD pioglitazone (ACTOS) on the morning of your surgery   TAKE ONLY THESE MEDICATIONS THE MORNING OF SURGERY WITH A SIP OF WATER:  levothyroxine (SYNTHROID)    No Alcohol for 24 hours before or after surgery.  No Smoking including e-cigarettes for 24 hours before surgery.  No chewable tobacco products for at least 6 hours before surgery.  No nicotine patches on the day of surgery.  Do not use any "recreational" drugs for at least a week  (preferably 2 weeks) before your surgery.  Please be advised that the combination of cocaine and anesthesia may have negative outcomes, up to and including death. If you test positive for cocaine, your surgery will be cancelled.  On the morning of surgery brush your teeth with toothpaste and water, you may rinse your mouth with mouthwash if you wish. Do not swallow any toothpaste or mouthwash.  Use CHG Soap or wipes as directed on instruction sheet.  Do not wear jewelry, make-up, hairpins, clips or nail polish.  Do not wear lotions, powders, or perfumes.   Do not shave body hair from the neck down 48 hours before surgery.  Contact lenses, hearing aids and dentures may not be worn into surgery.  Do not bring valuables to the hospital. [facility] is not responsible for any missing/lost belongings or valuables.   Notify your doctor if there is any change in your medical condition (cold, fever, infection).  Wear comfortable clothing (specific to your surgery type) to the hospital.  After surgery, you can help prevent lung complications by doing breathing exercises.  Take deep breaths and cough every 1-2 hours. Your doctor may order a device called an Incentive Spirometer to help you take deep breaths. When coughing or sneezing, hold a pillow firmly against your incision with both hands. This is called "splinting." Doing this helps protect your incision. It also decreases belly discomfort.  If you are being admitted to the hospital overnight, leave your suitcase in the car. After surgery it may be brought to your room.  In case of increased patient  census, it may be necessary for you, the patient, to continue your postoperative care in the Same Day Surgery department.  If you are being discharged the day of surgery, you will not be allowed to drive home. You will need a responsible individual to drive you home and stay with you for 24 hours after surgery.   If you are taking public  transportation, you will need to have a responsible individual with you.  Please call the Pre-admissions Testing Dept. at [phone] if you have any questions about these instructions.  Surgery Visitation Policy:  Patients having surgery or a procedure may have two visitors.  Children under the age of 71 must have an adult with them who is not the patient.  Inpatient Visitation:    Visiting hours are 7 a.m. to 8 p.m. Up to four visitors are allowed at one time in a patient room. The visitors may rotate out with other people during the day.  One visitor age 26 or older may stay with the patient overnight and must be in the room by 8 p.m.   Preparacin para la ciruga con jabn de GLUCONATO DE CLORHEXIDINA (CHG)  Jabn de gluconato de clorhexidina (CHG)  * Un limpiador antisptico que [employer] grmenes y se adhiere a la piel para seguir [employer] grmenes incluso despus del lavado.   *Se utiliza para ducharse la noche anterior a la [place] y la maana de la [place].  Antes de la [place], usted puede desempear un papel importante al reducir la cantidad de grmenes en su piel. El jabn CHG (gluconato de clorhexidina) es un limpiador antisptico que mata los grmenes y se adhiere a la piel para continuar matndolos incluso despus del lavado.  No lo utilice si es alrgico al CHG o a los jabones antibacterianos. Si su piel se enrojece o irrita, deje de usar CHG.  1. Ducharse la NOC[place] ANTES DE LA CIRUGA y la [place] DE LA CIRUGA con jabn CHG.  2. Si eliges lavarte el cabello, lvalo primero como de costumbre con tu champ habitual.  3. Despus del champ, enjuague bien el cabello y el cuerpo para eliminar el champ.  4. Utilice CHG como lo hara con cualquier otro jabn lquido. Puede aplicar CHG directamente sobre la piel y lavar suavemente con un pauelo o una toallita limpia.  5. Aplique el jabn CHG en su cuerpo nicamente desde el cuello hacia abajo. No utilizar en  heridas abiertas o llagas abiertas. Evite el contacto con los ojos, odos, boca y genitales (partes privadas). Lvese la cara y los genitales (partes privadas) con su jabn habitual.  6. Lvese bien, prestando especial atencin al rea donde se realizar su ciruga.  7. Enjuague bien su cuerpo con agua tibia.  8. No se duche ni se lave con su jabn normal despus de usar y enjuagar el jabn CHG.  9. Squese dando palmaditas con una toalla limpia.  10. Use pijamas limpios para dormir la noche anterior a la ciruga.  12. Coloque sbanas limpias en su cama la noche de su primera ducha y no duerma con mascotas.  13. Ducharse nuevamente con el jabn CHG el da de la ciruga antes de llegar al hospital.  14. No aplique desodorantes, lociones o polvos.  15. Por favor use ropa limpia al hospital.

## 2023-02-09 ENCOUNTER — Ambulatory Visit
Admission: RE | Admit: 2023-02-09 | Discharge: 2023-02-09 | Disposition: A | Discharge status: Home / Self Care | Payer: Medicaid Other | Source: Ambulatory Visit | Attending: Surgery | Admitting: Surgery

## 2023-02-09 DIAGNOSIS — K4090 Unilateral inguinal hernia, without obstruction or gangrene, not specified as recurrent: Secondary | ICD-10-CM | POA: Diagnosis present

## 2023-02-09 DIAGNOSIS — R1032 Left lower quadrant pain: Secondary | ICD-10-CM | POA: Insufficient documentation

## 2023-02-12 ENCOUNTER — Encounter: Payer: Self-pay | Admitting: Obstetrics and Gynecology

## 2023-02-12 ENCOUNTER — Encounter
Admission: RE | Admit: 2023-02-12 | Discharge: 2023-02-12 | Disposition: A | Discharge status: Home / Self Care | Payer: Medicaid Other | Source: Ambulatory Visit | Attending: Obstetrics and Gynecology | Admitting: Obstetrics and Gynecology

## 2023-02-12 ENCOUNTER — Ambulatory Visit (INDEPENDENT_AMBULATORY_CARE_PROVIDER_SITE_OTHER): Payer: Medicaid Other | Admitting: Obstetrics and Gynecology

## 2023-02-12 VITALS — BP 100/62 | HR 97 | Wt 185.0 lb

## 2023-02-12 DIAGNOSIS — Z01810 Encounter for preprocedural cardiovascular examination: Secondary | ICD-10-CM | POA: Diagnosis not present

## 2023-02-12 DIAGNOSIS — R1032 Left lower quadrant pain: Secondary | ICD-10-CM

## 2023-02-12 DIAGNOSIS — Z01818 Encounter for other preprocedural examination: Secondary | ICD-10-CM | POA: Diagnosis present

## 2023-02-12 DIAGNOSIS — Z3042 Encounter for surveillance of injectable contraceptive: Secondary | ICD-10-CM

## 2023-02-12 DIAGNOSIS — N930 Postcoital and contact bleeding: Secondary | ICD-10-CM

## 2023-02-12 DIAGNOSIS — E119 Type 2 diabetes mellitus without complications: Secondary | ICD-10-CM

## 2023-02-12 DIAGNOSIS — D369 Benign neoplasm, unspecified site: Secondary | ICD-10-CM

## 2023-02-12 DIAGNOSIS — Z01812 Encounter for preprocedural laboratory examination: Secondary | ICD-10-CM

## 2023-02-12 DIAGNOSIS — Z8742 Personal history of other diseases of the female genital tract: Secondary | ICD-10-CM

## 2023-02-12 LAB — RAPID HIV SCREEN (HIV 1/2 AB+AG)
HIV 1/2 Antibodies: NONREACTIVE
HIV-1 P24 Antigen - HIV24: NONREACTIVE

## 2023-02-12 LAB — COMPREHENSIVE METABOLIC PANEL
ALT: 26 U/L (ref 0–44)
AST: 23 U/L (ref 15–41)
Albumin: 3.9 g/dL (ref 3.5–5.0)
Alkaline Phosphatase: 92 U/L (ref 38–126)
Anion gap: 11 (ref 5–15)
BUN: 14 mg/dL (ref 6–20)
CO2: 23 mmol/L (ref 22–32)
Calcium: 9.2 mg/dL (ref 8.9–10.3)
Chloride: 103 mmol/L (ref 98–111)
Creatinine, Ser: 0.56 mg/dL (ref 0.44–1.00)
GFR, Estimated: >60 mL/min (ref >60)
Glucose, Bld: 94 mg/dL (ref 70–99)
Potassium: 3.5 mmol/L (ref 3.5–5.1)
Sodium: 137 mmol/L (ref 135–145)
Total Bilirubin: 0.7 mg/dL (ref 0.3–1.2)
Total Protein: 7.5 g/dL (ref 6.5–8.1)

## 2023-02-12 LAB — CBC
HCT: 49.2 % — ABNORMAL HIGH (ref 36.0–46.0)
Hemoglobin: 16.1 g/dL — ABNORMAL HIGH (ref 12.0–15.0)
MCH: 29.1 pg (ref 26.0–34.0)
MCHC: 32.7 g/dL (ref 30.0–36.0)
MCV: 88.8 fL (ref 80.0–100.0)
Platelets: 312 10*3/uL (ref 150–400)
RBC: 5.54 MIL/uL — ABNORMAL HIGH (ref 3.87–5.11)
RDW: 13 % (ref 11.5–15.5)
WBC: 7.2 10*3/uL (ref 4.0–10.5)
nRBC: 0 % (ref 0.0–0.2)

## 2023-02-12 LAB — TYPE AND SCREEN
ABO/RH(D): O POS
Antibody Screen: NEGATIVE

## 2023-02-12 NOTE — Progress Notes (Signed)
GYNECOLOGY PREOPERATIVE HISTORY AND PHYSICAL   Subjective:  Jennings Spanish Interpreter present for today's visit.   Caroline Pollard is a 42 y.o. [phone] here for surgical management of history of menorrhagia x 2-3 years, pelvic pain, and postcoital bleeding x 9 months. Currently on Depo Provera for contraception. Has had complete workup for bleeding with no significant findings. Desires definitive management with hysterectomy as she desires to discontinue the injections and no longer desires fertility. Surgery was initially postponed 2 months ago due to uncontrolled DM. Currently with improved levels (Hgb A1c now between 6-7) . No significant preoperative concerns at this time..   Planned procedure: LAPAROSCOPIC-ASSISTED VAGINAL HYSTERECTOMY WITH  BILATERAL SALPINGECTOMY     GYN History:  No LMP recorded. Currently receiving injections.  Last mammogram: 05/09/2022. Results were: BIRADS 2 - Benign. Dense breasts Last pap smear: 09/03/2021.  Results were: negative.  Last colonoscopy: 01/2023.  Has h/o adenomatous polyps   Past Medical History:  Diagnosis Date   Adenomatous colon polyp 08/12/2021   Anxiety    Diabetes mellitus without complication    History of kidney stones    Hyperlipidemia    Hypothyroidism    Psoriasis     Past Surgical History:  Procedure Laterality Date   APPENDECTOMY     CESAREAN SECTION     COLONOSCOPY N/A 01/30/2023   Procedure: COLONOSCOPY;  Surgeon: Jaynie Collins, DO;  Location: [facility] ENDOSCOPY;  Service: Gastroenterology;  Laterality: N/A;  SPANISH INTERPRETER   COLONOSCOPY WITH PROPOFOL N/A 08/02/2021   Procedure: COLONOSCOPY WITH PROPOFOL;  Surgeon: Jaynie Collins, DO;  Location: [facility] ENDOSCOPY;  Service: Gastroenterology;  Laterality: N/A;  DM - SPANISH INTERPRETER   DILATION AND CURETTAGE OF UTERUS      OB History  Gravida Para Term Preterm AB Living  3 1 1   2 1   SAB IAB Ectopic Multiple Live Births  2       1     # Outcome Date GA Lbr Len/2nd Weight Sex Delivery Anes PTL Lv  3 Term      CS-LTranv     2 SAB  [redacted]w[redacted]d         1 SAB  [redacted]w[redacted]d       FD    Family History  Problem Relation Age of Onset   Hypertension Mother    Diabetes Mother    Diabetes Sister    Cancer Neg Hx     Social History   Socioeconomic History   Marital status: Married    Spouse name: Thyra Breed   Number of children: 1   Years of education: Not on file   Highest education level: Not on file  Occupational History   Not on file  Tobacco Use   Smoking status: Never    Passive exposure: Never   Smokeless tobacco: Never  Vaping Use   Vaping Use: Never used  Substance and Sexual Activity   Alcohol use: No   Drug use: No   Sexual activity: Not on file  Other Topics Concern   Not on file  Social History Narrative   Not on file   Social Determinants of Health   Financial Resource Strain: Not on file  Food Insecurity: Not on file  Transportation Needs: Not on file  Physical Activity: Not on file  Stress: Not on file  Social Connections: Not on file  Intimate Partner Violence: Not on file    Current Outpatient Medications on File Prior to Visit  Medication Sig Dispense  Refill   Adalimumab (HUMIRA PEN) 40 MG/0.8ML PNKT Inject 40 mg into the skin every 14 (fourteen) days.     empagliflozin (JARDIANCE) 25 MG TABS tablet Take 25 mg by mouth daily.     ibuprofen (ADVIL) 200 MG tablet Take 400 mg by mouth every 6 (six) hours as needed for mild pain.     levothyroxine (SYNTHROID) 150 MCG tablet Take 150 mcg by mouth daily before breakfast. Take on an empty stomach with a glass of water at least 30-60 minutes before breakfast     liraglutide (VICTOZA) 18 MG/3ML SOPN Inject into the skin daily.     medroxyPROGESTERone (DEPO-PROVERA) 150 MG/ML injection      metFORMIN (GLUCOPHAGE) 1000 MG tablet Take 1,000 mg by mouth 2 (two) times daily with a meal.     pioglitazone (ACTOS) 15 MG tablet Take 15 mg by mouth daily.      No current facility-administered medications on file prior to visit.   No Known Allergies    Review of Systems Constitutional: No recent fever/chills/sweats Respiratory: No recent cough/bronchitis Cardiovascular: No chest pain Gastrointestinal: No recent nausea/vomiting/diarrhea Genitourinary: No UTI symptoms Hematologic/lymphatic:No history of coagulopathy or recent blood thinner use    Objective:   Blood pressure 100/62, pulse 97, weight 185 lb (83.9 kg), last menstrual period 07/11/2020, not currently breastfeeding. Body mass index is 36.13 kg/m.  CONSTITUTIONAL: Well-developed, well-nourished female in no acute distress.  HENT:  Normocephalic, atraumatic, External right and left ear normal. Oropharynx is clear and moist EYES: Conjunctivae and EOM are normal. Pupils are equal, round, and reactive to light. No scleral icterus.  NECK: Normal range of motion, supple, no masses SKIN: Skin is warm and dry. No rash noted. Not diaphoretic. No erythema. No pallor. NEUROLOGIC: Alert and oriented to person, place, and time. Normal reflexes, muscle tone coordination. No cranial nerve deficit noted. PSYCHIATRIC: Normal mood and affect. Normal behavior. Normal judgment and thought content. CARDIOVASCULAR: Normal heart rate noted, regular rhythm RESPIRATORY: Effort and breath sounds normal, no problems with respiration noted ABDOMEN: Soft, nontender, nondistended. PELVIC: Deferred MUSCULOSKELETAL: Normal range of motion. No edema and no tenderness. 2+ distal pulses.    Labs: Hospital Outpatient Visit on 02/12/2023  Component Date Value Ref Range Status   WBC 02/12/2023 7.2  4.0 - 10.5 K/uL Final   RBC 02/12/2023 5.54 (H)  3.87 - 5.11 MIL/uL Final   Hemoglobin 02/12/2023 16.1 (H)  12.0 - 15.0 g/dL Final   HCT 40/98/1191 49.2 (H)  36.0 - 46.0 % Final   MCV 02/12/2023 88.8  80.0 - 100.0 fL Final   MCH 02/12/2023 29.1  26.0 - 34.0 pg Final   MCHC 02/12/2023 32.7  30.0 - 36.0 g/dL  Final   RDW 47/82/9562 13.0  11.5 - 15.5 % Final   Platelets 02/12/2023 312  150 - 400 K/uL Final   nRBC 02/12/2023 0.0  0.0 - 0.2 % Final   Performed at [facility], [address] [place], [place] [address]   ABO/RH(D) 02/12/2023 O POS   Final   Antibody Screen 02/12/2023 NEG   Final   Sample Expiration 02/12/2023 02/26/2023,2359   Final   Extend sample reason 02/12/2023    Final                   Value:NO TRANSFUSIONS OR PREGNANCY IN THE PAST 3 MONTHS Performed at [facility], [address] [place], [place] [address]    Sodium 02/12/2023 137  135 - 145 mmol/L Final  Potassium 02/12/2023 3.5  3.5 - 5.1 mmol/L Final   Chloride 02/12/2023 103  98 - 111 mmol/L Final   CO2 02/12/2023 23  22 - 32 mmol/L Final   Glucose, Bld 02/12/2023 94  70 - 99 mg/dL Final   Glucose reference range applies only to samples taken after fasting for at least 8 hours.   BUN 02/12/2023 14  6 - 20 mg/dL Final   Creatinine, Ser 02/12/2023 0.56  0.44 - 1.00 mg/dL Final   Calcium 10/15/7587 9.2  8.9 - 10.3 mg/dL Final   Total Protein 32/54/9826 7.5  6.5 - 8.1 g/dL Final   Albumin 41/58/3094 3.9  3.5 - 5.0 g/dL Final   AST 07/68/0881 23  15 - 41 U/L Final   ALT 02/12/2023 26  0 - 44 U/L Final   Alkaline Phosphatase 02/12/2023 92  38 - 126 U/L Final   Total Bilirubin 02/12/2023 0.7  0.3 - 1.2 mg/dL Final   GFR, Estimated 02/12/2023 >60  >60 mL/min Final   Comment: (NOTE) Calculated using the CKD-EPI Creatinine Equation (2021)    Anion gap 02/12/2023 11  5 - 15 Final   Performed at [facility], [address] [place], [place] [address]   HIV-1 P24 Antigen - HIV24 02/12/2023 NON REACTIVE  NON REACTIVE Final   Comment: (NOTE) Detection of p24 may be inhibited by biotin in the sample, causing false negative results in acute infection.    HIV 1/2 Antibodies 02/12/2023 NON REACTIVE  NON REACTIVE Final   Interpretation (HIV Ag Ab) 02/12/2023 A non reactive test result means  that HIV 1 or HIV 2 antibodies and HIV 1 p24 antigen were not detected in the specimen.   Final   Performed at [facility], [address] [place], [place] [address]      Imaging Studies:   Annia Friendly, MD - 09/28/2022 Formatting of this note might be different from the original. EXAM: [place] ENDOVAGINAL (NON-OB) ACCESSION: [id number] UN  CLINICAL INDICATION: 42 years old with LLQ pain   LMP:  04/2022  COMPARISON: Endovaginal ultrasound 10/09/2021  TECHNIQUE: Ultrasound views of the pelvis were obtained endovaginally using gray scale and color Doppler imaging. Spectral Doppler imaging was also performed.  FINDINGS:  UTERUS/CERVIX: The uterus was retroverted and measured 7.7 x 4.8 x 4.4 cm. No focal myometrial mass was seen. The endometrium was normal in thickness, measuring 0.80 cm. There is a small amount of echogenic avascular debris and trace fluid noted within the endometrial canal, possibly blood products. Consider follow-up ultrasound in 6 weeks.  OVARIES: The ovaries were seen well transvaginally. Small cystic areas were seen within both ovaries compatible with follicles. Appropriate arterial inflow and venous outflow of the ovaries was documented on color and spectral Doppler imaging. The right ovary measured 4.3 x 3.3 x 2.0 cm and the left ovary measured 2.6 x 1.7 x 2.8 cm.  OTHER: No abnormal pelvic free fluid.  IMPRESSION:  No evidence of acute ovarian torsion at time of examination.  Small fluid with echogenic debris within the endometrial canal without vascularity, probably blood products. If clinical concern for polyp persists, recommend follow-up ultrasound in 6 weeks. At time of this interpretation, patient is scheduled for gynecological follow-up.  ==================== MODIFIED REPORT: (09/28/2022 8:02 AM) This report has been modified from its preliminary version; you may check the prior versions of radiology report, results history link for  prior report versions (if they were previously visible in Epic).  -----------------------------------------------     Exam  End: 09/28/22 01:49    Specimen Collected: 09/28/22 01:45 Last Resulted: 09/28/22 08:02  Received From: [facility]  Result Received: 11/05/22 10:00     Assessment:    1. Preoperative exam for gynecologic surgery   2. Postcoital bleeding   3. History of menorrhagia   4. Left lower quadrant abdominal pain   5. Depo-Provera contraceptive status   6. Type 2 diabetes mellitus without complication, without long-term current use of insulin   7. Adenomatous polyp     Plan:   - Counseling: Procedure, risks, reasons, benefits and complications (including injury to bowel, bladder, major blood vessel, ureter, bleeding, possibility of transfusion, infection, or fistula formation) reviewed in detail. Likelihood of success in alleviating the patient's condition was discussed. Routine postoperative instructions will be reviewed with the patient and her family in detail after surgery.  The patient concurred with the proposed plan, giving informed written consent for the surgery.   - Preop testing reviewed. - Instructions reviewed, including NPO after midnight. - Has recently been evaluated by GI with repeat colonoscopy for h/o polyps. - Type II DM now well controlled.     Hildred Laser, MD [facility]

## 2023-02-13 LAB — RPR: RPR Ser Ql: NONREACTIVE

## 2023-02-16 ENCOUNTER — Ambulatory Visit: Payer: Medicaid Other | Admitting: Anesthesiology

## 2023-02-16 ENCOUNTER — Encounter
Admission: RE | Disposition: A | Discharge status: Home / Self Care | Payer: Self-pay | Source: Home / Self Care | Attending: Obstetrics and Gynecology

## 2023-02-16 ENCOUNTER — Encounter: Payer: Self-pay | Admitting: Obstetrics and Gynecology

## 2023-02-16 ENCOUNTER — Other Ambulatory Visit: Payer: Self-pay

## 2023-02-16 ENCOUNTER — Ambulatory Visit: Payer: Medicaid Other | Admitting: Urgent Care

## 2023-02-16 ENCOUNTER — Ambulatory Visit
Admission: RE | Admit: 2023-02-16 | Discharge: 2023-02-16 | Disposition: A | Discharge status: Home / Self Care | Payer: Medicaid Other | Attending: Obstetrics and Gynecology | Admitting: Obstetrics and Gynecology

## 2023-02-16 DIAGNOSIS — E039 Hypothyroidism, unspecified: Secondary | ICD-10-CM | POA: Diagnosis not present

## 2023-02-16 DIAGNOSIS — Z8742 Personal history of other diseases of the female genital tract: Secondary | ICD-10-CM

## 2023-02-16 DIAGNOSIS — Z3009 Encounter for other general counseling and advice on contraception: Secondary | ICD-10-CM

## 2023-02-16 DIAGNOSIS — E119 Type 2 diabetes mellitus without complications: Secondary | ICD-10-CM | POA: Insufficient documentation

## 2023-02-16 DIAGNOSIS — N920 Excessive and frequent menstruation with regular cycle: Secondary | ICD-10-CM | POA: Diagnosis not present

## 2023-02-16 DIAGNOSIS — Z01812 Encounter for preprocedural laboratory examination: Secondary | ICD-10-CM

## 2023-02-16 DIAGNOSIS — R102 Pelvic and perineal pain: Secondary | ICD-10-CM | POA: Diagnosis not present

## 2023-02-16 DIAGNOSIS — Z640 Problems related to unwanted pregnancy: Secondary | ICD-10-CM | POA: Diagnosis not present

## 2023-02-16 DIAGNOSIS — E1100 Type 2 diabetes mellitus with hyperosmolarity without nonketotic hyperglycemic-hyperosmolar coma (NKHHC): Secondary | ICD-10-CM

## 2023-02-16 DIAGNOSIS — D259 Leiomyoma of uterus, unspecified: Secondary | ICD-10-CM

## 2023-02-16 DIAGNOSIS — Z793 Long term (current) use of hormonal contraceptives: Secondary | ICD-10-CM | POA: Diagnosis not present

## 2023-02-16 HISTORY — PX: LAPAROSCOPIC VAGINAL HYSTERECTOMY WITH SALPINGECTOMY: SHX6680

## 2023-02-16 LAB — GLUCOSE, CAPILLARY
Glucose-Capillary: 134 mg/dL — ABNORMAL HIGH (ref 70–99)
Glucose-Capillary: 159 mg/dL — ABNORMAL HIGH (ref 70–99)

## 2023-02-16 LAB — POCT PREGNANCY, URINE: Preg Test, Ur: NEGATIVE

## 2023-02-16 SURGERY — HYSTERECTOMY, VAGINAL, LAPAROSCOPY-ASSISTED, WITH SALPINGECTOMY
Anesthesia: General | Laterality: Bilateral

## 2023-02-16 MED ORDER — SIMETHICONE 80 MG PO CHEW: 80.0000 mg | CHEWABLE_TABLET | Freq: Four times a day (QID) | ORAL | 2 refills | Status: AC | PRN

## 2023-02-16 MED ORDER — ACETAMINOPHEN 500 MG PO TABS
ORAL_TABLET | ORAL | Status: AC
  Filled 2023-02-16: qty 2

## 2023-02-16 MED ORDER — IBUPROFEN 600 MG PO TABS: 600.0000 mg | ORAL_TABLET | Freq: Four times a day (QID) | ORAL | 0 refills | Status: AC | PRN

## 2023-02-16 MED ORDER — FENTANYL CITRATE (PF) 100 MCG/2ML IJ SOLN
INTRAMUSCULAR | Status: AC
  Filled 2023-02-16: qty 2

## 2023-02-16 MED ORDER — PROPOFOL 10 MG/ML IV BOLUS
INTRAVENOUS | Status: AC
  Filled 2023-02-16: qty 20

## 2023-02-16 MED ORDER — LIDOCAINE HCL (CARDIAC) PF 100 MG/5ML IV SOSY
PREFILLED_SYRINGE | INTRAVENOUS | Status: DC | PRN
  Administered 2023-02-16: 100 mg via INTRAVENOUS

## 2023-02-16 MED ORDER — SUGAMMADEX SODIUM 200 MG/2ML IV SOLN
INTRAVENOUS | Status: DC | PRN
  Administered 2023-02-16: 200 mg via INTRAVENOUS

## 2023-02-16 MED ORDER — DEXAMETHASONE SODIUM PHOSPHATE 10 MG/ML IJ SOLN
INTRAMUSCULAR | Status: DC | PRN
  Administered 2023-02-16: 10 mg via INTRAVENOUS

## 2023-02-16 MED ORDER — ORAL CARE MOUTH RINSE: 15.0000 mL | Freq: Once | OROMUCOSAL | Status: AC

## 2023-02-16 MED ORDER — FENTANYL CITRATE (PF) 100 MCG/2ML IJ SOLN
25.0000 ug | INTRAMUSCULAR | Status: DC | PRN
  Administered 2023-02-16: 25 ug via INTRAVENOUS
  Administered 2023-02-16: 50 ug via INTRAVENOUS
  Administered 2023-02-16: 25 ug via INTRAVENOUS

## 2023-02-16 MED ORDER — OXYCODONE HCL 5 MG/5ML PO SOLN: 5.0000 mg | Freq: Once | ORAL | Status: AC | PRN

## 2023-02-16 MED ORDER — ROCURONIUM BROMIDE 100 MG/10ML IV SOLN
INTRAVENOUS | Status: DC | PRN
  Administered 2023-02-16: 50 mg via INTRAVENOUS

## 2023-02-16 MED ORDER — CHLORHEXIDINE GLUCONATE 0.12 % MT SOLN
OROMUCOSAL | Status: AC
  Filled 2023-02-16: qty 15

## 2023-02-16 MED ORDER — FAMOTIDINE 20 MG PO TABS
20.0000 mg | ORAL_TABLET | Freq: Once | ORAL | Status: AC
  Administered 2023-02-16: 20 mg via ORAL

## 2023-02-16 MED ORDER — BUPIVACAINE HCL 0.5 % IJ SOLN
INTRAMUSCULAR | Status: DC | PRN
  Administered 2023-02-16: 10 mL
  Administered 2023-02-16: 5 mL

## 2023-02-16 MED ORDER — ACETAMINOPHEN 500 MG PO TABS
1000.0000 mg | ORAL_TABLET | ORAL | Status: AC
  Administered 2023-02-16: 1000 mg via ORAL

## 2023-02-16 MED ORDER — OXYCODONE-ACETAMINOPHEN 5-325 MG PO TABS: 1.0000 | ORAL_TABLET | Freq: Four times a day (QID) | ORAL | 0 refills | Status: DC | PRN

## 2023-02-16 MED ORDER — MIDAZOLAM HCL 2 MG/2ML IJ SOLN
INTRAMUSCULAR | Status: AC
  Filled 2023-02-16: qty 2

## 2023-02-16 MED ORDER — BUPIVACAINE HCL (PF) 0.5 % IJ SOLN
INTRAMUSCULAR | Status: AC
  Filled 2023-02-16: qty 30

## 2023-02-16 MED ORDER — POVIDONE-IODINE 10 % EX SWAB
2.0000 | Freq: Once | CUTANEOUS | Status: AC
  Administered 2023-02-16: 2 via TOPICAL

## 2023-02-16 MED ORDER — GABAPENTIN 300 MG PO CAPS
300.0000 mg | ORAL_CAPSULE | ORAL | Status: AC
  Administered 2023-02-16: 300 mg via ORAL

## 2023-02-16 MED ORDER — GABAPENTIN 300 MG PO CAPS
ORAL_CAPSULE | ORAL | Status: AC
  Filled 2023-02-16: qty 1

## 2023-02-16 MED ORDER — DEXAMETHASONE SODIUM PHOSPHATE 10 MG/ML IJ SOLN
INTRAMUSCULAR | Status: AC
  Filled 2023-02-16: qty 1

## 2023-02-16 MED ORDER — SODIUM CHLORIDE 0.9 % IV SOLN: INTRAVENOUS | Status: DC

## 2023-02-16 MED ORDER — PROPOFOL 10 MG/ML IV BOLUS
INTRAVENOUS | Status: DC | PRN
  Administered 2023-02-16: 160 mg via INTRAVENOUS

## 2023-02-16 MED ORDER — ROCURONIUM BROMIDE 10 MG/ML (PF) SYRINGE
PREFILLED_SYRINGE | INTRAVENOUS | Status: AC
  Filled 2023-02-16: qty 10

## 2023-02-16 MED ORDER — DROPERIDOL 2.5 MG/ML IJ SOLN
INTRAMUSCULAR | Status: AC
  Filled 2023-02-16: qty 2

## 2023-02-16 MED ORDER — DOCUSATE SODIUM 100 MG PO CAPS: 100.0000 mg | ORAL_CAPSULE | Freq: Two times a day (BID) | ORAL | 2 refills | Status: AC | PRN

## 2023-02-16 MED ORDER — PROPOFOL 10 MG/ML IV BOLUS: INTRAVENOUS | Status: DC | PRN

## 2023-02-16 MED ORDER — PHENYLEPHRINE 80 MCG/ML (10ML) SYRINGE FOR IV PUSH (FOR BLOOD PRESSURE SUPPORT)
PREFILLED_SYRINGE | INTRAVENOUS | Status: DC | PRN
  Administered 2023-02-16: 80 ug via INTRAVENOUS
  Administered 2023-02-16: 160 ug via INTRAVENOUS

## 2023-02-16 MED ORDER — CELECOXIB 200 MG PO CAPS
ORAL_CAPSULE | ORAL | Status: AC
  Filled 2023-02-16: qty 2

## 2023-02-16 MED ORDER — OXYCODONE HCL 5 MG PO TABS
ORAL_TABLET | ORAL | Status: AC
  Filled 2023-02-16: qty 1

## 2023-02-16 MED ORDER — CEFAZOLIN SODIUM-DEXTROSE 2-4 GM/100ML-% IV SOLN
2.0000 g | INTRAVENOUS | Status: AC
  Administered 2023-02-16: 2 g via INTRAVENOUS

## 2023-02-16 MED ORDER — EPHEDRINE SULFATE (PRESSORS) 50 MG/ML IJ SOLN
INTRAMUSCULAR | Status: DC | PRN
  Administered 2023-02-16: 10 mg via INTRAVENOUS

## 2023-02-16 MED ORDER — LIDOCAINE HCL (PF) 2 % IJ SOLN
INTRAMUSCULAR | Status: AC
  Filled 2023-02-16: qty 5

## 2023-02-16 MED ORDER — CELECOXIB 200 MG PO CAPS
400.0000 mg | ORAL_CAPSULE | ORAL | Status: AC
  Administered 2023-02-16: 400 mg via ORAL

## 2023-02-16 MED ORDER — VASOPRESSIN 20 UNIT/ML IV SOLN
INTRAVENOUS | Status: AC
  Filled 2023-02-16: qty 1

## 2023-02-16 MED ORDER — LACTATED RINGERS IV SOLN: INTRAVENOUS | Status: DC

## 2023-02-16 MED ORDER — 0.9 % SODIUM CHLORIDE (POUR BTL) OPTIME
TOPICAL | Status: DC | PRN
  Administered 2023-02-16: 500 mL

## 2023-02-16 MED ORDER — CEFAZOLIN SODIUM-DEXTROSE 2-4 GM/100ML-% IV SOLN
INTRAVENOUS | Status: AC
  Filled 2023-02-16: qty 100

## 2023-02-16 MED ORDER — ONDANSETRON HCL 4 MG/2ML IJ SOLN
INTRAMUSCULAR | Status: DC | PRN
  Administered 2023-02-16: 4 mg via INTRAVENOUS

## 2023-02-16 MED ORDER — CHLORHEXIDINE GLUCONATE 0.12 % MT SOLN
15.0000 mL | Freq: Once | OROMUCOSAL | Status: AC
  Administered 2023-02-16: 15 mL via OROMUCOSAL

## 2023-02-16 MED ORDER — DROPERIDOL 2.5 MG/ML IJ SOLN
0.6250 mg | INTRAMUSCULAR | Status: AC
  Administered 2023-02-16: 0.625 mg via INTRAVENOUS

## 2023-02-16 MED ORDER — FENTANYL CITRATE (PF) 100 MCG/2ML IJ SOLN
INTRAMUSCULAR | Status: DC | PRN
  Administered 2023-02-16 (×2): 50 ug via INTRAVENOUS

## 2023-02-16 MED ORDER — OXYCODONE HCL 5 MG PO TABS
5.0000 mg | ORAL_TABLET | Freq: Once | ORAL | Status: AC | PRN
  Administered 2023-02-16: 5 mg via ORAL

## 2023-02-16 MED ORDER — VASOPRESSIN 20 UNIT/ML IV SOLN
INTRAVENOUS | Status: DC | PRN
  Administered 2023-02-16: 20 [IU]

## 2023-02-16 MED ORDER — ONDANSETRON HCL 4 MG/2ML IJ SOLN
INTRAMUSCULAR | Status: AC
  Filled 2023-02-16: qty 2

## 2023-02-16 MED ORDER — FAMOTIDINE 20 MG PO TABS
ORAL_TABLET | ORAL | Status: AC
  Filled 2023-02-16: qty 1

## 2023-02-16 SURGICAL SUPPLY — 66 items
ADH SKN CLS APL DERMABOND .7 (GAUZE/BANDAGES/DRESSINGS) ×1
APL PRP STRL LF DISP 70% ISPRP (MISCELLANEOUS) ×1
BAG DRN RND TRDRP ANRFLXCHMBR (UROLOGICAL SUPPLIES) ×1
BAG URINE DRAIN 2000ML AR STRL (UROLOGICAL SUPPLIES) ×1 IMPLANT
BLADE SURG 15 STRL LF DISP TIS (BLADE) ×1 IMPLANT
BLADE SURG 15 STRL SS (BLADE) ×1
BLADE SURG SZ10 CARB STEEL (BLADE) ×1 IMPLANT
BLADE SURG SZ11 CARB STEEL (BLADE) ×1 IMPLANT
CATH FOLEY 2WAY  5CC 16FR (CATHETERS) ×1
CATH FOLEY 2WAY 5CC 16FR (CATHETERS) ×1
CATH URTH 16FR FL 2W BLN LF (CATHETERS) ×1 IMPLANT
CHLORAPREP W/TINT 26 (MISCELLANEOUS) ×1 IMPLANT
DERMABOND ADVANCED .7 DNX12 (GAUZE/BANDAGES/DRESSINGS) ×1 IMPLANT
ELECT REM PT RETURN 9FT ADLT (ELECTROSURGICAL) ×1
ELECTRODE REM PT RTRN 9FT ADLT (ELECTROSURGICAL) ×1 IMPLANT
GAUZE 4X4 16PLY ~~LOC~~+RFID DBL (SPONGE) ×3 IMPLANT
GAUZE PACK 2X3YD (PACKING) ×1 IMPLANT
GLOVE BIO SURGEON STRL SZ 6.5 (GLOVE) ×1 IMPLANT
GLOVE BIO SURGEON STRL SZ8 (GLOVE) ×2 IMPLANT
GLOVE INDICATOR 7.0 STRL GRN (GLOVE) ×2 IMPLANT
GLOVE PI ORTHO PRO STRL 7.5 (GLOVE) ×2 IMPLANT
GOWN STRL REUS W/ TWL LRG LVL3 (GOWN DISPOSABLE) ×2 IMPLANT
GOWN STRL REUS W/ TWL XL LVL3 (GOWN DISPOSABLE) ×1 IMPLANT
GOWN STRL REUS W/TWL LRG LVL3 (GOWN DISPOSABLE) ×2
GOWN STRL REUS W/TWL XL LVL3 (GOWN DISPOSABLE) ×1
IRRIGATION STRYKERFLOW (MISCELLANEOUS) ×1 IMPLANT
IRRIGATOR STRYKERFLOW (MISCELLANEOUS)
IV LACTATED RINGERS 1000ML (IV SOLUTION) ×1 IMPLANT
KIT PINK PAD W/HEAD ARE REST (MISCELLANEOUS) ×1
KIT PINK PAD W/HEAD ARM REST (MISCELLANEOUS) ×1 IMPLANT
KIT TURNOVER CYSTO (KITS) ×1 IMPLANT
LABEL OR SOLS (LABEL) ×1 IMPLANT
LIGASURE LAP MARYLAND 5MM 37CM (ELECTROSURGICAL) ×1 IMPLANT
MANIFOLD NEPTUNE II (INSTRUMENTS) ×1 IMPLANT
MANIPULATOR VCARE LG CRV RETR (MISCELLANEOUS) IMPLANT
MANIPULATOR VCARE SML CRV RETR (MISCELLANEOUS) IMPLANT
MANIPULATOR VCARE STD CRV RETR (MISCELLANEOUS) IMPLANT
NDL SPNL 22GX3.5 QUINCKE BK (NEEDLE) ×1 IMPLANT
NEEDLE SPNL 22GX3.5 QUINCKE BK (NEEDLE) ×1 IMPLANT
PACK BASIN MINOR ARMC (MISCELLANEOUS) ×1 IMPLANT
PACK GYN LAPAROSCOPIC (MISCELLANEOUS) ×1 IMPLANT
PAD OB MATERNITY 4.3X12.25 (PERSONAL CARE ITEMS) ×1 IMPLANT
SCISSORS METZENBAUM CVD 33 (INSTRUMENTS) ×1 IMPLANT
SCRUB CHG 4% DYNA-HEX 4OZ (MISCELLANEOUS) ×1 IMPLANT
SHEARS HARMONIC ACE PLUS 36CM (ENDOMECHANICALS) IMPLANT
SLEEVE Z-THREAD 5X100MM (TROCAR) ×2 IMPLANT
STRIP CLOSURE SKIN 1/2X4 (GAUZE/BANDAGES/DRESSINGS) ×1 IMPLANT
SUT MNCRL 4-0 (SUTURE) ×1
SUT MNCRL 4-0 27XMFL (SUTURE) ×1
SUT VIC AB 0 CT1 27 (SUTURE) ×1
SUT VIC AB 0 CT1 27XCR 8 STRN (SUTURE) ×1 IMPLANT
SUT VIC AB 0 CT1 36 (SUTURE) ×1 IMPLANT
SUT VIC AB 0 CT2 27 (SUTURE) ×1 IMPLANT
SUT VIC AB 2-0 CT1 (SUTURE) ×1 IMPLANT
SUT VIC AB 4-0 FS2 27 (SUTURE) ×1 IMPLANT
SUT VIC AB 4-0 SH 27 (SUTURE) ×1
SUT VIC AB 4-0 SH 27XANBCTRL (SUTURE) IMPLANT
SUTURE MNCRL 4-0 27XMF (SUTURE) ×1 IMPLANT
SYR 10ML LL (SYRINGE) ×1 IMPLANT
SYR 50ML LL SCALE MARK (SYRINGE) IMPLANT
SYR CONTROL 10ML LL (SYRINGE) ×1 IMPLANT
TAPE TRANSPORE STRL 2 31045 (GAUZE/BANDAGES/DRESSINGS) ×1 IMPLANT
TRAP FLUID SMOKE EVACUATOR (MISCELLANEOUS) ×1 IMPLANT
TROCAR Z-THREAD FIOS 5X100MM (TROCAR) ×1 IMPLANT
TUBING EVAC SMOKE HEATED PNEUM (TUBING) ×1 IMPLANT
WATER STERILE IRR 500ML POUR (IV SOLUTION) ×1 IMPLANT

## 2023-02-16 NOTE — Anesthesia Procedure Notes (Signed)
Procedure Name: Intubation Date/Time: 02/16/2023 10:19 AM  Performed by: Danelle Berry, CRNAPre-anesthesia Checklist: Emergency Drugs available, Patient identified, Patient being monitored, Suction available and Timeout performed Patient Re-evaluated:Patient Re-evaluated prior to induction Oxygen Delivery Method: Circle system utilized and Simple face mask Preoxygenation: Pre-oxygenation with 100% oxygen Induction Type: IV induction Ventilation: Mask ventilation without difficulty Laryngoscope Size: McGraph and 3 Grade View: Grade I Tube type: Oral Tube size: 7.0 mm Number of attempts: 1 Airway Equipment and Method: Stylet Placement Confirmation: ETT inserted through vocal cords under direct vision and positive ETCO2 Secured at: 20 cm Tube secured with: Tape Dental Injury: Teeth and Oropharynx as per pre-operative assessment

## 2023-02-16 NOTE — Anesthesia Postprocedure Evaluation (Signed)
Anesthesia Post Note  Patient: Caroline Pollard  Procedure(s) Performed: LAPAROSCOPIC ASSISTED VAGINAL HYSTERECTOMY WITH BILATERAL SALPINGECTOMY (Bilateral)  Patient location during evaluation: PACU Anesthesia Type: General Level of consciousness: awake and alert Pain management: pain level controlled Vital Signs Assessment: post-procedure vital signs reviewed and stable Respiratory status: spontaneous breathing, nonlabored ventilation, respiratory function stable and patient connected to nasal cannula oxygen Cardiovascular status: blood pressure returned to baseline and stable Postop Assessment: no apparent nausea or vomiting Anesthetic complications: no   No notable events documented.   Last Vitals:  Vitals:   02/16/23 1312 02/16/23 1313  BP:  120/75  Pulse:  87  Resp:  17  Temp: (!) 36.2 C 36.4 C  SpO2:  96%    Last Pain:  Vitals:   02/16/23 1313  TempSrc:   PainSc: 4                  Cleda Mccreedy Marquinn Meschke

## 2023-02-16 NOTE — H&P (Signed)
GYNECOLOGY PREOPERATIVE HISTORY AND PHYSICAL   Subjective:  [employer] [phone] used for today's Spanish interpretation.   Caroline Pollard is a 42 y.o. [phone] here for surgical management of history of menorrhagia x 2-3 years, pelvic pain, and postcoital bleeding x 9 months. Currently on Depo Provera for contraception. Has had complete workup for bleeding with no significant findings. Desires definitive management with hysterectomy as she desires to discontinue the injections and no longer desires fertility. Surgery was initially postponed 2 months ago due to uncontrolled DM. Currently with improved levels (Hgb A1c now between 6-7) . No significant preoperative concerns at this time..   Planned procedure: LAPAROSCOPIC-ASSISTED VAGINAL HYSTERECTOMY WITH  BILATERAL SALPINGECTOMY     GYN History:  No LMP recorded. Currently receiving injections.  Last mammogram: 05/09/2022. Results were: BIRADS 2 - Benign. Dense breasts Last pap smear: 09/03/2021.  Results were: negative.  Last colonoscopy: 01/2023.  Has h/o adenomatous polyps   Past Medical History:  Diagnosis Date   Adenomatous colon polyp 08/12/2021   Anxiety    Diabetes mellitus without complication    History of kidney stones    Hyperlipidemia    Hypothyroidism    Psoriasis     Past Surgical History:  Procedure Laterality Date   APPENDECTOMY     CESAREAN SECTION     COLONOSCOPY N/A 01/30/2023   Procedure: COLONOSCOPY;  Surgeon: Jaynie Collins, DO;  Location: [facility] ENDOSCOPY;  Service: Gastroenterology;  Laterality: N/A;  SPANISH INTERPRETER   COLONOSCOPY WITH PROPOFOL N/A 08/02/2021   Procedure: COLONOSCOPY WITH PROPOFOL;  Surgeon: Jaynie Collins, DO;  Location: [facility] ENDOSCOPY;  Service: Gastroenterology;  Laterality: N/A;  DM - SPANISH INTERPRETER   DILATION AND CURETTAGE OF UTERUS      OB History  Gravida Para Term Preterm AB Living  SAB IAB Ectopic Multiple  Live Births  2       1    # Outcome Date GA Lbr Len/2nd Weight Sex Delivery Anes PTL Lv  3 Term      CS-LTranv     2 SAB  [redacted]w[redacted]d         1 SAB  [redacted]w[redacted]d       FD    Family History  Problem Relation Age of Onset   Hypertension Mother    Diabetes Mother    Diabetes Sister    Cancer Neg Hx     Social History   Socioeconomic History   Marital status: Married    Spouse name: Thyra Breed   Number of children: 1   Years of education: Not on file   Highest education level: Not on file  Occupational History   Not on file  Tobacco Use   Smoking status: Never    Passive exposure: Never   Smokeless tobacco: Never  Vaping Use   Vaping Use: Never used  Substance and Sexual Activity   Alcohol use: No   Drug use: No   Sexual activity: Not on file  Other Topics Concern   Not on file  Social History Narrative   Not on file   Social Determinants of Health   Financial Resource Strain: Not on file  Food Insecurity: Not on file  Transportation Needs: Not on file  Physical Activity: Not on file  Stress: Not on file  Social Connections: Not on file  Intimate Partner Violence: Not on file    No current facility-administered medications on file prior to encounter.  Current Outpatient Medications on File Prior to Encounter  Medication Sig Dispense Refill   levothyroxine (SYNTHROID) 150 MCG tablet Take 137 mcg by mouth daily before breakfast. Take on an empty stomach with a glass of water at least 30-60 minutes before breakfast     liraglutide (VICTOZA) 18 MG/3ML SOPN Inject into the skin daily.     Adalimumab (HUMIRA PEN) 40 MG/0.8ML PNKT Inject 40 mg into the skin every 14 (fourteen) days.     empagliflozin (JARDIANCE) 25 MG TABS tablet Take 25 mg by mouth daily.     medroxyPROGESTERone (DEPO-PROVERA) 150 MG/ML injection  (Patient not taking: Reported on 02/12/2023)     metFORMIN (GLUCOPHAGE) 1000 MG tablet Take 1,000 mg by mouth 2 (two) times daily with a meal.     No Known  Allergies    Review of Systems Constitutional: No recent fever/chills/sweats Respiratory: No recent cough/bronchitis Cardiovascular: No chest pain Gastrointestinal: No recent nausea/vomiting/diarrhea Genitourinary: No UTI symptoms Hematologic/lymphatic:No history of coagulopathy or recent blood thinner use    Objective:   Blood pressure 103/61, pulse 76, temperature 97.9 F (36.6 C), temperature source Temporal, resp. rate 15, height  (1.549 m), weight 82.6 kg, last menstrual period 07/11/2020, SpO2 99 %. Body mass index is 34.39 kg/m.  CONSTITUTIONAL: Well-developed, well-nourished female in no acute distress.  HENT:  Normocephalic, atraumatic, External right and left ear normal. Oropharynx is clear and moist EYES: Conjunctivae and EOM are normal. Pupils are equal, round, and reactive to light. No scleral icterus.  NECK: Normal range of motion, supple, no masses SKIN: Skin is warm and dry. No rash noted. Not diaphoretic. No erythema. No pallor. NEUROLOGIC: Alert and oriented to person, place, and time. Normal reflexes, muscle tone coordination. No cranial nerve deficit noted. PSYCHIATRIC: Normal mood and affect. Normal behavior. Normal judgment and thought content. CARDIOVASCULAR: Normal heart rate noted, regular rhythm RESPIRATORY: Effort and breath sounds normal, no problems with respiration noted ABDOMEN: Soft, nontender, nondistended. PELVIC: Deferred MUSCULOSKELETAL: Normal range of motion. No edema and no tenderness. 2+ distal pulses.    Labs: Hospital Outpatient Visit on 02/12/2023  Component Date Value Ref Range Status   WBC 02/12/2023 7.2  4.0 - 10.5 K/uL Final   RBC 02/12/2023 5.54 (H)  3.87 - 5.11 MIL/uL Final   Hemoglobin 02/12/2023 16.1 (H)  12.0 - 15.0 g/dL Final   HCT 09/81/1914 49.2 (H)  36.0 - 46.0 % Final   MCV 02/12/2023 88.8  80.0 - 100.0 fL Final   MCH 02/12/2023 29.1  26.0 - 34.0 pg Final   MCHC 02/12/2023 32.7  30.0 - 36.0 g/dL Final   RDW  78/29/5621 13.0  11.5 - 15.5 % Final   Platelets 02/12/2023 312  150 - 400 K/uL Final   nRBC 02/12/2023 0.0  0.0 - 0.2 % Final   Performed at [facility], [address] [place], [place] [address]   ABO/RH(D) 02/12/2023 O POS   Final   Antibody Screen 02/12/2023 NEG   Final   Sample Expiration 02/12/2023 02/26/2023,2359   Final   Extend sample reason 02/12/2023    Final                   Value:NO TRANSFUSIONS OR PREGNANCY IN THE PAST 3 MONTHS Performed at [facility], [address] [place], [place] [address]    Sodium 02/12/2023 137  135 - 145 mmol/L Final   Potassium 02/12/2023 3.5  3.5 - 5.1 mmol/L Final   Chloride 02/12/2023 103  98 - 111 mmol/L Final   CO2 02/12/2023 23  22 - 32 mmol/L Final   Glucose, Bld 02/12/2023 94  70 - 99 mg/dL Final   Glucose reference range applies only to samples taken after fasting for at least 8 hours.   BUN 02/12/2023 14  6 - 20 mg/dL Final   Creatinine, Ser 02/12/2023 0.56  0.44 - 1.00 mg/dL Final   Calcium 16/07/9603 9.2  8.9 - 10.3 mg/dL Final   Total Protein 54/06/8118 7.5  6.5 - 8.1 g/dL Final   Albumin 14/78/2956 3.9  3.5 - 5.0 g/dL Final   AST 21/30/8657 23  15 - 41 U/L Final   ALT 02/12/2023 26  0 - 44 U/L Final   Alkaline Phosphatase 02/12/2023 92  38 - 126 U/L Final   Total Bilirubin 02/12/2023 0.7  0.3 - 1.2 mg/dL Final   GFR, Estimated 02/12/2023 >60  >60 mL/min Final   Comment: (NOTE) Calculated using the CKD-EPI Creatinine Equation (2021)    Anion gap 02/12/2023 11  5 - 15 Final   Performed at [facility], [address] [place], [place] [address]   HIV-1 P24 Antigen - HIV24 02/12/2023 NON REACTIVE  NON REACTIVE Final   Comment: (NOTE) Detection of p24 may be inhibited by biotin in the sample, causing false negative results in acute infection.    HIV 1/2 Antibodies 02/12/2023 NON REACTIVE  NON REACTIVE Final   Interpretation (HIV Ag Ab) 02/12/2023 A non reactive test result means that HIV 1 or  HIV 2 antibodies and HIV 1 p24 antigen were not detected in the specimen.   Final   Performed at [facility], [address] [place], [place] [address]   RPR Ser Ql 02/12/2023 NON REACTIVE  NON REACTIVE Final   Performed at [facility], [address] [place], [place] [address]      Imaging Studies:   Annia Friendly, MD - 09/28/2022 Formatting of this note might be different from the original. EXAM: [place] ENDOVAGINAL (NON-OB) ACCESSION: [id number]  CLINICAL INDICATION: 42 years old with LLQ pain   LMP:  04/2022  COMPARISON: Endovaginal ultrasound 10/09/2021  TECHNIQUE: Ultrasound views of the pelvis were obtained endovaginally using gray scale and color Doppler imaging. Spectral Doppler imaging was also performed.  FINDINGS:  UTERUS/CERVIX: The uterus was retroverted and measured 7.7 x 4.8 x 4.4 cm. No focal myometrial mass was seen. The endometrium was normal in thickness, measuring 0.80 cm. There is a small amount of echogenic avascular debris and trace fluid noted within the endometrial canal, possibly blood products. Consider follow-up ultrasound in 6 weeks.  OVARIES: The ovaries were seen well transvaginally. Small cystic areas were seen within both ovaries compatible with follicles. Appropriate arterial inflow and venous outflow of the ovaries was documented on color and spectral Doppler imaging. The right ovary measured 4.3 x 3.3 x 2.0 cm and the left ovary measured 2.6 x 1.7 x 2.8 cm.  OTHER: No abnormal pelvic free fluid.  IMPRESSION:  No evidence of acute ovarian torsion at time of examination.  Small fluid with echogenic debris within the endometrial canal without vascularity, probably blood products. If clinical concern for polyp persists, recommend follow-up ultrasound in 6 weeks. At time of this interpretation, patient is scheduled for gynecological follow-up.  ==================== MODIFIED REPORT: (09/28/2022 8:02 AM) This report has been  modified from its preliminary version; you may check the prior versions of radiology report, results history link for prior report versions (if they  were previously visible in Epic).  -----------------------------------------------     Exam End: 09/28/22 01:49    Specimen Collected: 09/28/22 01:45 Last Resulted: 09/28/22 08:02  Received From: [facility]  Result Received: 11/05/22 10:00     Assessment:     1. Postcoital bleeding   2. History of menorrhagia   3. Left lower quadrant abdominal pain   4. Depo-Provera contraceptive status   5. Type 2 diabetes mellitus without complication, without long-term current use of insulin   6. Adenomatous polyp    Plan:   - Counseling: Procedure, risks, reasons, benefits and complications (including injury to bowel, bladder, major blood vessel, ureter, bleeding, possibility of transfusion, infection, or fistula formation) reviewed in detail. Likelihood of success in alleviating the patient's condition was discussed. Routine postoperative instructions will be reviewed with the patient and her family in detail after surgery.  The patient concurred with the proposed plan, giving informed written consent for the surgery.   - Preop testing reviewed. - Patient has been NPO since midnight.  - Has recently been evaluated by GI with repeat colonoscopy for h/o polyps. - Type II DM now well controlled.     Hildred Laser, MD [facility]

## 2023-02-16 NOTE — Op Note (Addendum)
Procedure(s): LAPAROSCOPIC ASSISTED VAGINAL HYSTERECTOMY WITH BILATERAL SALPINGECTOMY Procedure Note  Caroline Pollard female 42 y.o. 02/16/2023  Indications: The patient is a 42 y.o. [id number] female with history of menorrhagia, pelvic pain, undesired fertility  Pre-operative Diagnosis: History of menorrhagia, pelvic pain, undesired fertility  Post-operative Diagnosis: Same  Surgeon: Hildred Laser, MD  Assistants:  Brennan Bailey, MD.   Anesthesia: General endotracheal anesthesia  Findings: The uterus was sounded to 10 Fallopian tubes and ovaries appeared normal.  Procedure Details: The patient was seen in the Holding Room. The risks, benefits, complications, treatment options, and expected outcomes were discussed with the patient.  The patient concurred with the proposed plan, giving informed consent.  The site of surgery properly noted/marked. The patient was taken to the Operating Room, identified as Caroline Pollard and the procedure verified as Procedure(s) (LRB): LAPAROSCOPIC ASSISTED VAGINAL HYSTERECTOMY WITH BILATERAL SALPINGECTOMY (Bilateral).   She was then placed under general anesthesia without difficulty. She was placed in the dorsal lithotomy position, and was prepped and draped in a sterile manner.  A Time Out was held and the above information confirmed.  A straight catheterization was performed. A sterile speculum was inserted into the vagina and the cervix was grasped at the anterior lip using a single-toothed tenaculum.  The uterus was sounded to 10 cm, and a Hulka clamp was placed for uterine manipulation.  The speculum and tenaculum were then removed. Attention was turned to the abdomen where an umbilical incision was made with the scalpel.  The Optiview 5-mm trocar and sleeve were then advanced without difficulty with the laparoscope under direct visualization into the abdomen.  The abdomen was then insufflated with carbon dioxide gas and adequate  pneumoperitoneum was obtained. Bilateral 5-mm lower quadrant ports were then placed under direct visualization.  A survey of the patient's pelvis and abdomen revealed the findings as above.  Attention was turned to the mesosalpinx of the right fallopian tube, which was clamped using the Ligasure scalpel and ligated. The utero-ovarian ligament and the round ligament were also ligated with the Ligasure scalpel working towards the broad ligament.  The round and broad ligaments were then clamped and transected with the Ligasure scalpel.  The ureter were noted to be safely away from the area of dissection.  The uterine artery was then skeletonized and a bladder flap was created.  The bladder was then bluntly dissected off the lower uterine segment.  At this point, attention was turned to the right uterine vessels, which were clamped and ligated using the Ligasure scalpel.  Attention was then turned to the patient's left side, which was treated in a similar manner by taking the infundibulopelvic ligament, the round ligament and the broad ligaments and the bladder flap creation was completed. The left uterine vessels were also clamped and transected in a similar fashion.  A final survey of the operative site was performed, and hemostasis was noted throughout. No intraoperative injury to other surrounding organs was noted.  The abdomen was desufflated and all instruments were then removed from the patient's abdomen.   All skin incisions were injected with a total of 15 ml of 0.5% Marcaine and closed with 4-0 Monocryl.  The incisions were covered with Dermabond. The decision was made to proceed with completing the hysterectomy via the vaginal route .  Attention was then turned to her pelvis.  A weighted speculum was then placed in the vagina, and the anterior and posterior lips of the cervix were grasped bilaterally with tenaculums.  The  cervix was then injected circumferentially with 10 ml of Pitressin solution (20 ml  mixed with 20 ml of 0.5% Marcaine solution) to maintain hemostasis.  The cervix was then circumferentially incised, and the posterior cul-de-sac was entered sharply without difficulty.  A long weighted speculum was inserted into the posterior cul-de-sac. The Heaney clamp was then used to clamp the uterosacral ligaments on either side.  They were then cut and sutured ligated with 0 Vicryl, and were held with a tag for later identification. Of note, all sutures used in this case were 0 Vicryl unless otherwise noted.   The cardinal ligaments were then clamped, cut and ligated bilaterally.  At this point, full entry into the anterior cul-de-sac was made, no injury to the bladder was noted.  The uterus was freed from all ligaments and was then delivered and sent to pathology.  A 0-Vicryl suture was then used to close the angles of the vagina bilaterally, with care given to incorporate the uterosacral pedicles bilaterally.  Next, a 0-Vicryl suture was used to close the peritoneum of the vaginal cuff in a pursestring fashion.  The vaginal cuff was then closed with a series of figure-of-eight sutures using 0 Vicryl.  All instruments were then removed from the pelvis  The patient tolerated the procedures well.  All instruments, needles, and sponge counts were correct  times three. The patient was taken to the recovery room awake, extubated and in stable condition.   An experienced assistant was required given the standard of surgical care given the complexity of the case.  This assistant was needed for exposure, dissection, suctioning, retraction, instrument exchange, and for overall help during the procedure.   Estimated Blood Loss:  ml      Drains: foley catheterization prior to procedure to gravity drainage with 200 ml of clear urine at end of procedure         Total IV Fluids:  1200 ml  Specimens:  Uterus with cervix, bilateral fallopian tubes         Implants: None         Complications:  None; patient  tolerated the procedure well.         Disposition: PACU - hemodynamically stable.         Condition: stable   Hildred Laser, MD [place] [facility] [facility] at [facility]

## 2023-02-16 NOTE — Progress Notes (Signed)
CT scan personally viewed.  Overall no evidence of any hernia or other issue to cause the pain she's having in the left lower quadrant.  No further follow up is needed.  Henrene Dodge, MD

## 2023-02-16 NOTE — Anesthesia Preprocedure Evaluation (Signed)
Anesthesia Evaluation  Patient identified by MRN, date of birth, ID band Patient awake    Reviewed: Allergy & Precautions, NPO status , Patient's Chart, lab work & pertinent test results  History of Anesthesia Complications Negative for: history of anesthetic complications  Airway Mallampati: III  TM Distance: <3 FB Neck ROM: full    Dental  (+) Chipped, Poor Dentition, Missing   Pulmonary neg pulmonary ROS, neg shortness of breath   Pulmonary exam normal        Cardiovascular Exercise Tolerance: Good (-) Past MI negative cardio ROS Normal cardiovascular exam     Neuro/Psych   Anxiety     negative neurological ROS     GI/Hepatic negative GI ROS, Neg liver ROS,,,  Endo/Other  diabetes, Type 2Hypothyroidism    Renal/GU      Musculoskeletal   Abdominal   Peds  Hematology negative hematology ROS (+)   Anesthesia Other Findings Past Medical History: 08/12/2021: Adenomatous colon polyp No date: Anxiety No date: Diabetes mellitus without complication No date: History of kidney stones No date: Hyperlipidemia No date: Hypothyroidism No date: Psoriasis  Past Surgical History: No date: APPENDECTOMY No date: CESAREAN SECTION 01/30/2023: COLONOSCOPY; N/A     Comment:  Procedure: COLONOSCOPY;  Surgeon: Jaynie Collins,              DO;  Location: [facility] ENDOSCOPY;  Service:               Gastroenterology;  Laterality: N/A;  SPANISH INTERPRETER 08/02/2021: COLONOSCOPY WITH PROPOFOL; N/A     Comment:  Procedure: COLONOSCOPY WITH PROPOFOL;  Surgeon: Jaynie Collins, DO;  Location: [facility] ENDOSCOPY;  Service:               Gastroenterology;  Laterality: N/A;  DM - SPANISH               INTERPRETER No date: DILATION AND CURETTAGE OF UTERUS  BMI    Body Mass Index: 34.39 kg/m      Reproductive/Obstetrics negative OB ROS                             Anesthesia  Physical Anesthesia Plan  ASA: 3  Anesthesia Plan: General ETT   Post-op Pain Management:    Induction: Intravenous  PONV Risk Score and Plan: Ondansetron, Dexamethasone, Midazolam and Treatment may vary due to age or medical condition  Airway Management Planned: Oral ETT  Additional Equipment:   Intra-op Plan:   Post-operative Plan: Extubation in OR  Informed Consent: I have reviewed the patients History and Physical, chart, labs and discussed the procedure including the risks, benefits and alternatives for the proposed anesthesia with the patient or authorized representative who has indicated his/her understanding and acceptance.     Dental Advisory Given  Plan Discussed with: Anesthesiologist, CRNA and Surgeon  Anesthesia Plan Comments: (Patient declines interpreter   Patient consented for risks of anesthesia including but not limited to:  - adverse reactions to medications - damage to eyes, teeth, lips or other oral mucosa - nerve damage due to positioning  - sore throat or hoarseness - Damage to heart, brain, nerves, lungs, other parts of body or loss of life  Patient voiced understanding.)       Anesthesia Quick Evaluation

## 2023-02-16 NOTE — Discharge Instructions (Addendum)
CIRUGIA AMBULATORIA       Instruccionnes de alta    Date Franco Nones) 02/16/2023   1.  Las drogas que se Dispensing optician en su cuerpo [employer], asi            que por las proximas 24 horas usted no debe:   Conducir Field seismologist) un automovil   Hacer ninguna decision legal   Tomar ninguna bebida alcoholica  2.  A) [place] puede comenzar una dieta regular.  Es mejor que hoy empiece con           liquidos y gradualmente anada [address]       B) Puede comer cualquier comida que desee pero es mejor empezar con liquidos,                      luego sopitas con galletas saladas y gradualmente llegar a las comidas solidas.  3.  Por favor avise a su medico inmediatamente si usted tiene algun sangrado anormal,       tiene dificultad con la respiracion, enrojecimiento y Engineer, mining en el sitio de la cirugia, [place],       fiebro o dolor que se alivia con [place].  4.  A) Su visita posoperatoria (despues de su operacion) es con el  Dr. Valentino Saxon  Date ***                    Time ***        B)  Por favor llame para hacer la cita posoperatoria.  5.  Istrucciones especificas :

## 2023-02-16 NOTE — Transfer of Care (Signed)
Immediate Anesthesia Transfer of Care Note  Patient: Caroline Pollard  Procedure(s) Performed: LAPAROSCOPIC ASSISTED VAGINAL HYSTERECTOMY WITH BILATERAL SALPINGECTOMY (Bilateral)  Patient Location: PACU  Anesthesia Type:General  Level of Consciousness: awake and sedated  Airway & Oxygen Therapy: Patient Spontanous Breathing and Patient connected to face mask  Post-op Assessment: Report given to RN and Post -op Vital signs reviewed and stable  Post vital signs: Reviewed and stable  Last Vitals:  Vitals Value Taken Time  BP 104/75 02/16/23 1215  Temp    Pulse 83 02/16/23 1219  Resp 21 02/16/23 1219  SpO2 100 % 02/16/23 1219  Vitals shown include unvalidated device data.  Last Pain:  Vitals:   02/16/23 0837  TempSrc: Temporal  PainSc: 0-No pain         Complications: No notable events documented.

## 2023-02-17 LAB — SURGICAL PATHOLOGY

## 2023-02-19 ENCOUNTER — Encounter: Payer: Self-pay | Admitting: Obstetrics and Gynecology

## 2023-02-20 NOTE — Unmapped (Signed)
[facility] Refill Coordination Note    Specialty Medication(s) to be Shipped:   Inflammatory Disorders: Humira    Other medication(s) to be shipped: No additional medications requested for fill at this time     Stephanie Hester, DOB: 17-Apr-1981  Phone: [phone] (home)       All above HIPAA information was verified with patient.     Was a Nurse, learning disability used for this call? No    Completed refill call assessment today to schedule patient's medication shipment from the [facility] [phone]).  All relevant notes have been reviewed.     Specialty medication(s) and dose(s) confirmed: Regimen is correct and unchanged.   Changes to medications: Stephanie Hester reports no changes at this time.  Changes to insurance: No  New side effects reported not previously addressed with a pharmacist or physician: None reported  Questions for the pharmacist: No    Confirmed patient received a Conservation officer, historic buildings and a Surveyor, mining with first shipment. The patient will receive a drug information handout for each medication shipped and additional FDA Medication Guides as required.       DISEASE/MEDICATION-SPECIFIC INFORMATION        For patients on injectable medications: Patient currently has 0 doses left.  Next injection is scheduled for 5/10.    SPECIALTY MEDICATION ADHERENCE     Medication Adherence    Patient reported X missed doses in the last month: 0  Specialty Medication: HUMIRA(CF) PEN 40 mg/0.4 mL  Patient is on additional specialty medications: No              Were doses missed due to medication being on hold? No    Humira 40/0.4 mg/ml: 0 days of medicine on hand        REFERRAL TO PHARMACIST     Referral to the pharmacist: Not needed      [facility]     Shipping address confirmed in Epic.       Delivery Scheduled: Yes, Expected medication delivery date: 02/24/23.     Medication will be delivered via Same Day Courier to the prescription address in Epic WAM.    [facility] Specialty Technician

## 2023-02-24 MED FILL — HUMIRA PEN CITRATE FREE 40 MG/0.4 ML: SUBCUTANEOUS | 28 days supply | Qty: 2 | Fill #5

## 2023-02-25 ENCOUNTER — Ambulatory Visit (INDEPENDENT_AMBULATORY_CARE_PROVIDER_SITE_OTHER): Payer: Medicaid Other | Admitting: Obstetrics and Gynecology

## 2023-02-25 VITALS — BP 98/69 | HR 93 | Resp 16 | Ht 61.0 in | Wt 186.8 lb

## 2023-02-25 DIAGNOSIS — R102 Pelvic and perineal pain: Secondary | ICD-10-CM

## 2023-02-25 DIAGNOSIS — Z4889 Encounter for other specified surgical aftercare: Secondary | ICD-10-CM

## 2023-02-25 DIAGNOSIS — K5900 Constipation, unspecified: Secondary | ICD-10-CM

## 2023-02-25 DIAGNOSIS — Z90710 Acquired absence of both cervix and uterus: Secondary | ICD-10-CM

## 2023-02-25 DIAGNOSIS — K5903 Drug induced constipation: Secondary | ICD-10-CM

## 2023-02-25 NOTE — Progress Notes (Signed)
    OBSTETRICS/GYNECOLOGY POST-OPERATIVE CLINIC VISIT  Subjective:     Caroline Pollard is a 42 y.o. G1P1021 female who presents to the clinic  1  weeks status post LAPAROSCOPIC ASSISTED  VAGINAL HYSTERECTOMY WITH BILATERAL SALPINGECTOMY  for History of menorrhagia, Postcoital bleeding, Pelvic pain . Eating a regular diet without difficulty. Bowel movements are abnormal with pain and constipation . Pain is controlled with current analgesics. Medications being used: ibuprofen (OTC) and narcotic analgesics including oxycodone/acetaminophen (Percocet, Tylox).  Patient does complain of pain of her right labia.  Notes that it is sore and hurts to sit for long periods of time.    The following portions of the patient's history were reviewed and updated as appropriate: allergies, current medications, past family history, past medical history, past social history, past surgical history, and problem list.  Review of Systems Pertinent items noted in HPI and remainder of comprehensive ROS otherwise negative.   Objective:   BP 98/69   Pulse 93   Resp 16   Ht 5\' 1"  (1.549 m)   Wt 186 lb 12.8 oz (84.7 kg)   LMP 07/11/2020 Comment: Pregnancy test negative  BMI 35.30 kg/m  Body mass index is 35.3 kg/m.  General:  alert and no distress  Abdomen: soft, bowel sounds active, non-tender  Incision:   healing well, no drainage, no erythema, no hernia, no seroma, no swelling, she does have some burning, no dehiscence, incision well approximated  Pelvis:  External genitalia normal with exception of small area of repair on left labia majora due to small abrasion during surgical procedure, healing well.     Pathology:   A.  UTERUS WITH CERVIX AND BILATERAL FALLOPIAN TUBE; TOTAL HYSTERECTOMY  WITH BILATERAL SALPINGECTOMY:  - CHRONIC CERVICITIS WITH SQUAMOUS METAPLASIA.  - WEAKLY PROLIFERATIVE ENDOMETRIUM WITH TUBAL METAPLASIA.  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  - BILATERAL  FALLOPIAN TUBES WITH FIMBRIATED END.  - LEFT PARATUBAL CYSTS.  - NEGATIVE FOR MALIGNANCY.    Assessment:   Post-operative wound check Constipation Vaginal pain  Plan:   1. Continue any current medications as instructed by provider. Encouraged use of stool softeners if not already currently using, can increase to BID dosing.  2. Wound care discussed.  Advised on use of topical lidocaine gel to external vulva as needed for comfort.  3. Operative findings again reviewed. Pathology report discussed. 4. Activity restrictions: no bending, stooping, or squatting, no lifting more than 10-15 pounds, and pelvic rest. 5. Anticipated return to work:  5 weeks . 6. Follow up: 5 weeks for final post-op exam    Hildred Laser, MD [facility]

## 2023-03-18 NOTE — Unmapped (Signed)
[facility] Clinical Assessment & Refill Coordination Note    Stephanie Hester, DOB: [birth year]  Phone: [phone] (home)     All above HIPAA information was verified with patient.     Was a Nurse, learning disability used for this call? No    Specialty Medication(s):   Inflammatory Disorders: Humira     Current Outpatient Medications   Medication Sig Dispense Refill    blood-glucose meter (FREESTYLE INSULINX) Misc Test fasting and one hour after each meal.  Any brand ok. 1 each 0    ciclopirox (LOPROX) 0.77 % cream Apply topically two (2) times a day. Gently massage into affected areas and surrounding skin. Apply to left foot and in between the toes. 30 g 6    clindamycin (CLEOCIN T) 1 % external solution Apply topically up to two (2) times a day. Apply to the red irritated pus bumps on the scalp until they clear. Can restart if needed. 60 mL 4    clobetasol (TEMOVATE) 0.05 % external solution Apply topically daily. To rash on scalp as needed for rash. 50 mL 5    clobetasoL (TEMOVATE) 0.05 % ointment Apply twice daily to red, raised rash for 2 weeks 60 g 2    empagliflozin (JARDIANCE) 25 mg tablet Take 1 tablet (25 mg total) by mouth daily.      empty container Misc Use as directed to dispose of used Humira pens. 1 each 2    glucagon (GLUCAGON) 1 mg SolR Glucagon emergency kit: use only as directed. 1 each 1    halobetasol (ULTRAVATE) 0.05 % ointment Apply topically Two (2) times a day. Onto rash on body for 2 weeks at a time as needed for rash. Stop after clearance of rash. 100 g 4    HUMIRA PEN CITRATE FREE 40 MG/0.4 ML Inject the contents of 1 pen (40 mg total) under the skin every fourteen (14) days. 2 each 3    HUMIRA PEN CITRATE FREE 40 MG/0.4 ML Inject the contents of 1 pen (40 mg total) under the skin every fourteen (14) days. 2 each 10    insulin syringe-needle U-100 1 mL 31 x 3/8 (10 mm) Syrg Use with insulin as directed twice daily as directed. 100 each 3    levothyroxine (SYNTHROID) 125 MCG tablet Take 137 mcg by mouth daily.      levothyroxine (SYNTHROID) 175 MCG tablet Take 1 tablet (175 mcg total) by mouth daily.      medroxyPROGESTERone (DEPO-PROVERA) 150 mg/mL injection       metFORMIN (GLUMETZA) 500 MG (MOD) 24 hr tablet Take 1 tablet (500 mg total) by mouth two (2) times a day.      multivitamin, prenatal, folic acid-iron, 27-1 mg Tab Take 1 tablet by mouth daily. 100 tablet 3    promethazine (PHENERGAN) 25 MG tablet Take 1 tablet (25 mg total) by mouth every six (6) hours as needed. 30 tablet 0    VICTOZA 3-PAK 0.6 mg/0.1 mL (18 mg/3 mL) injection Inject 0.1 mL (0.6 mg total) under the skin daily.       No current facility-administered medications for this visit.        Changes to medications: Stephanie Hester reports no changes at this time.    No Known Allergies    Changes to allergies: No    SPECIALTY MEDICATION ADHERENCE     Humira 40  mg/0.54ml : 10 days of medicine on hand       Medication Adherence  Patient reported X missed doses in the last month: 0  Specialty Medication: Humira 98mb/0.4ml q 14 days  Patient is on additional specialty medications: No  Patient is on more than two specialty medications: No  Informant: patient          Specialty medication(s) dose(s) confirmed: Regimen is correct and unchanged.     Are there any concerns with adherence? No    Adherence counseling provided? Not needed    CLINICAL MANAGEMENT AND INTERVENTION      Clinical Benefit Assessment:    Do you feel the medicine is effective or helping your condition? Yes    Clinical Benefit counseling provided? Not needed    Adverse Effects Assessment:    Are you experiencing any side effects? No    Are you experiencing difficulty administering your medicine? No    Quality of Life Assessment:    Quality of Life    Rheumatology  Oncology  Dermatology  1. What impact has your specialty medication had on the symptoms of your skin condition (i.e. itchiness, soreness, stinging)?: Tremendous  2. What impact has your specialty medication had on your comfort level with your skin?: Tremendous  Cystic Fibrosis          How many days over the past month did your psoriasis  keep you from your normal activities? For example, brushing your teeth or getting up in the morning. 0    Have you discussed this with your provider? Not needed    Acute Infection Status:    Acute infections noted within Epic:  No active infections  Patient reported infection: None    Therapy Appropriateness:    Is therapy appropriate and patient progressing towards therapeutic goals? Yes, therapy is appropriate and should be continued    DISEASE/MEDICATION-SPECIFIC INFORMATION      For patients on injectable medications: Patient currently has 0 doses left.  Next injection is scheduled for 03/27/23.    Chronic Inflammatory Diseases: Have you experienced any flares in the last month? No    PATIENT SPECIFIC NEEDS     Does the patient have any physical, cognitive, or cultural barriers? No    Is the patient high risk? No    Did the patient require a clinical intervention? No    Does the patient require physician intervention or other additional services (i.e., nutrition, smoking cessation, social work)? No    SOCIAL DETERMINANTS OF HEALTH     At the [facility], we have learned that life circumstances - like trouble affording food, housing, utilities, or transportation can affect the health of many of our patients.   That is why we wanted to ask: are you currently experiencing any life circumstances that are negatively impacting your health and/or quality of life? Patient declined to answer    Social Determinants of Health     Financial Resource Strain: Not on file   Internet Connectivity: Not on file   Food Insecurity: No Food Insecurity (03/11/2018)    Hunger Vital Sign     Worried About Running Out of Food in the Last Year: Never true     Ran Out of Food in the Last Year: Never true   Tobacco Use: Low Risk  (10/23/2022)    Patient History     Smoking Tobacco Use: Never     Smokeless Tobacco Use: Never     Passive Exposure: Not on file   Housing/Utilities: Unknown (01/31/2021)    Housing/Utilities     Within the past 12  months, have you ever stayed: outside, in a car, in a tent, in an overnight shelter, or temporarily in someone else's home (i.e. couch-surfing)?: No     Are you worried about losing your housing?: Not on file     Within the past 12 months, have you been unable to get utilities (heat, electricity) when it was really needed?: Not on file   Alcohol Use: Not on file   Transportation Needs: No Transportation Needs (03/11/2018)    PRAPARE - Transportation     Lack of Transportation (Medical): No     Lack of Transportation (Non-Medical): No   Substance Use: Not on file   Health Literacy: Medium Risk (01/31/2021)    Health Literacy     : Rarely   Physical Activity: Not on file   Interpersonal Safety: Not on file   Stress: Not on file   Intimate Partner Violence: Unknown (03/11/2018)    Humiliation, Afraid, Rape, and Kick questionnaire     Fear of Current or Ex-Partner: Not asked     Emotionally Abused: Not asked     Physically Abused: Not asked     Sexually Abused: Not asked   Depression: Not on file   Social Connections: Not on file       Would you be willing to receive help with any of the needs that you have identified today? Not applicable       SHIPPING     Specialty Medication(s) to be Shipped:   Inflammatory Disorders: Humira    Other medication(s) to be shipped:  clobetasol solution     Changes to insurance: No    Delivery Scheduled: Yes, Expected medication delivery date: 03/20/23.     Medication will be delivered via Same Day Courier to the confirmed prescription address in [facility].    The patient will receive a drug information handout for each medication shipped and additional FDA Medication Guides as required.  Verified that patient has previously received a Conservation officer, historic buildings and a Surveyor, mining.    The patient or caregiver noted above participated in the development of this care plan and knows that they can request review of or adjustments to the care plan at any time.      All of the patient's questions and concerns have been addressed.    Darryl Nestle, PharmD   [facility] Specialty Pharmacist

## 2023-03-20 MED FILL — CLOBETASOL 0.05 % SCALP SOLUTION: TOPICAL | 30 days supply | Qty: 50 | Fill #3

## 2023-03-20 MED FILL — HUMIRA PEN CITRATE FREE 40 MG/0.4 ML: SUBCUTANEOUS | 28 days supply | Qty: 2 | Fill #6

## 2023-03-30 NOTE — Progress Notes (Unsigned)
    OBSTETRICS/GYNECOLOGY POST-OPERATIVE CLINIC VISIT  Subjective:     Caroline Pollard is a 42 y.o. female who presents to the clinic 6 weeks status post LAPAROSCOPIC ASSISTED  VAGINAL HYSTERECTOMY WITH BILATERAL SALPINGECTOMY for History of menorrhagia, Postcoital bleeding, Pelvic pain . Eating a regular diet {with-without:5700} difficulty. Bowel movements are {normal/abnormal***:19619}. {pain control:13522::"The patient is not having any pain."}  {Common ambulatory SmartLinks:19316}  Review of Systems {ros; complete:30496}   Objective:   LMP 07/11/2020 Comment: Pregnancy test negative There is no height or weight on file to calculate BMI.  General:  alert and no distress  Abdomen: soft, bowel sounds active, non-tender  Incision:   {incision:13716::"no dehiscence","incision well approximated","healing well","no drainage","no erythema","no hernia","no seroma","no swelling"}    Pathology:    Assessment:   Patient s/p LAPAROSCOPIC ASSISTED  VAGINAL HYSTERECTOMY WITH BILATERAL SALPINGECTOMY (surgery)  {doing well:13525::"Doing well postoperatively."}   Plan:   1. Continue any current medications as instructed by provider. 2. Wound care discussed. 3. Operative findings again reviewed. Pathology report discussed. 4. Activity restrictions: {restrictions:13723} 5. Anticipated return to work: {work return:14002}. 6. Follow up: {1-61:09604} {time; units:18646} for ***    Hildred Laser, MD [facility]

## 2023-04-01 ENCOUNTER — Ambulatory Visit (INDEPENDENT_AMBULATORY_CARE_PROVIDER_SITE_OTHER): Payer: Medicaid Other | Admitting: Obstetrics and Gynecology

## 2023-04-01 ENCOUNTER — Encounter: Payer: Self-pay | Admitting: Obstetrics and Gynecology

## 2023-04-01 VITALS — BP 120/80 | Ht 61.0 in | Wt 192.0 lb

## 2023-04-01 DIAGNOSIS — Z4889 Encounter for other specified surgical aftercare: Secondary | ICD-10-CM

## 2023-04-01 DIAGNOSIS — Z90710 Acquired absence of both cervix and uterus: Secondary | ICD-10-CM

## 2023-04-01 DIAGNOSIS — Z8742 Personal history of other diseases of the female genital tract: Secondary | ICD-10-CM

## 2023-04-10 ENCOUNTER — Ambulatory Visit
Admit: 2023-04-10 | Discharge: 2023-04-11 | Payer: PRIVATE HEALTH INSURANCE | Attending: Retina Specialist | Primary: Retina Specialist

## 2023-04-17 MED FILL — CLOBETASOL 0.05 % SCALP SOLUTION: TOPICAL | 30 days supply | Qty: 50 | Fill #4

## 2023-04-17 MED FILL — HUMIRA PEN CITRATE FREE 40 MG/0.4 ML: SUBCUTANEOUS | 28 days supply | Qty: 2 | Fill #7

## 2023-04-27 DIAGNOSIS — Z1231 Encounter for screening mammogram for malignant neoplasm of breast: Principal | ICD-10-CM

## 2023-05-04 IMAGING — US US PELVIS LIMITED
1 series · 14 of 22 positions shown · non-contrast
Comparison: None.

CLINICAL DATA: Left lower quadrant/left pelvis pain

EXAM:
LIMITED ULTRASOUND OF PELVIS
TECHNIQUE: Longitudinal and transverse images of the left lower quadrant
abdomen and pelvis obtained.

[Series 1: us abdomen limited · 14 of 22 slices shown]
[im 1/22]
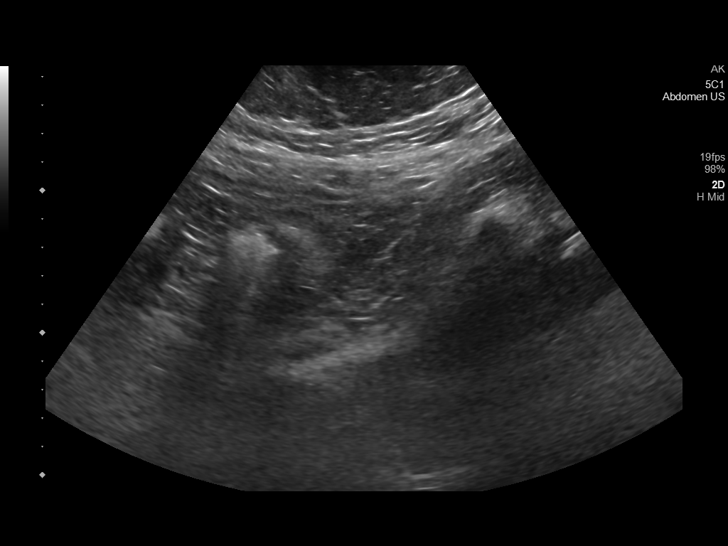
[im 3/22]
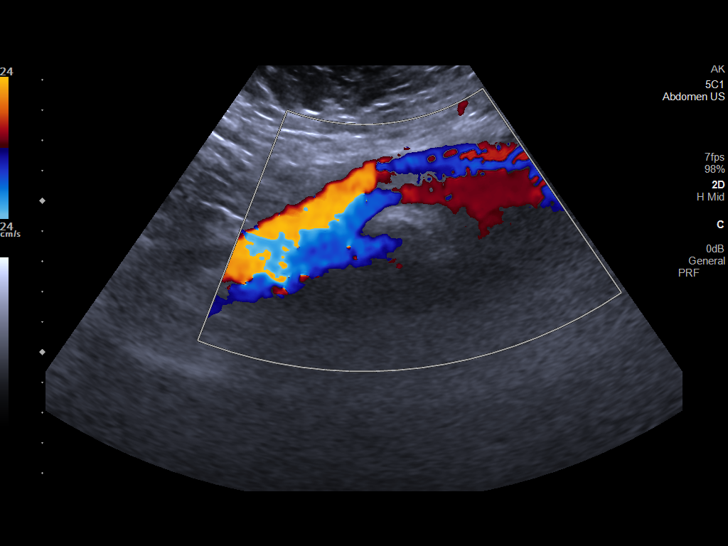
[im 4/22]
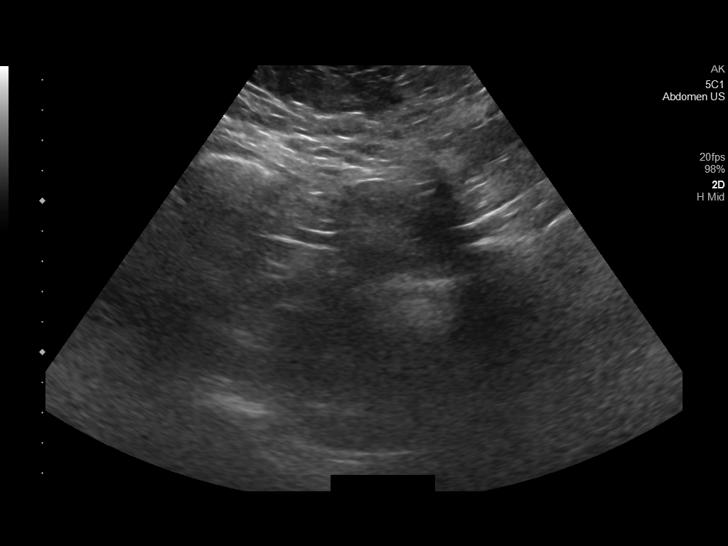
[im 6/22]
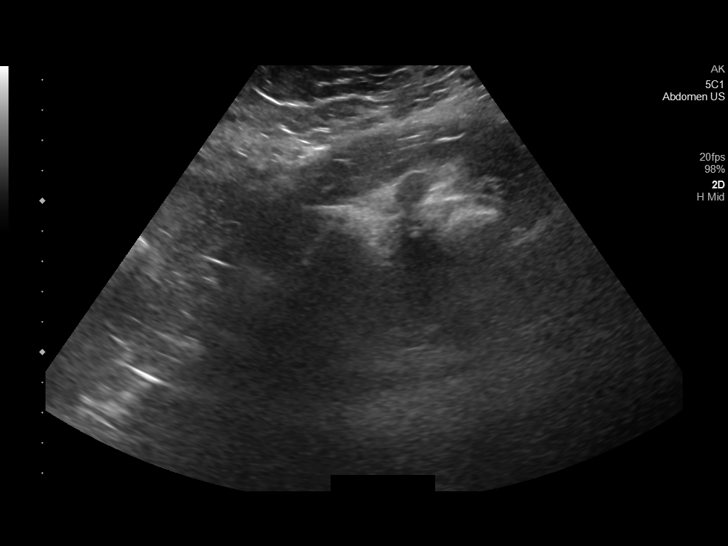
[im 8/22]
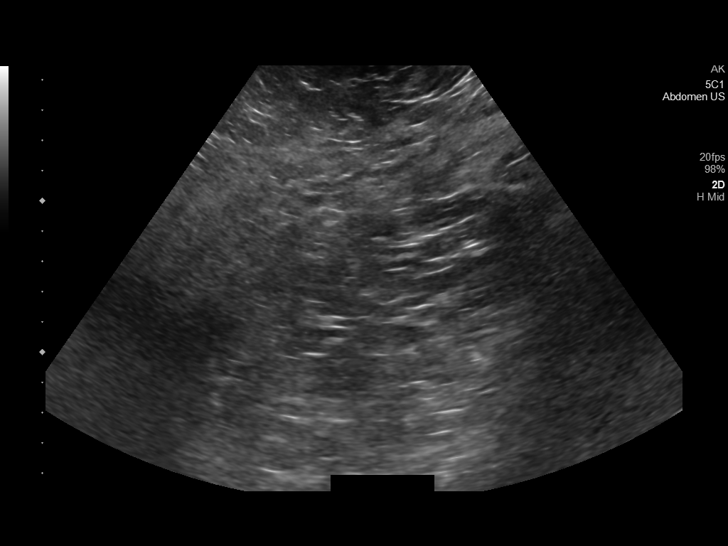
[im 9/22]
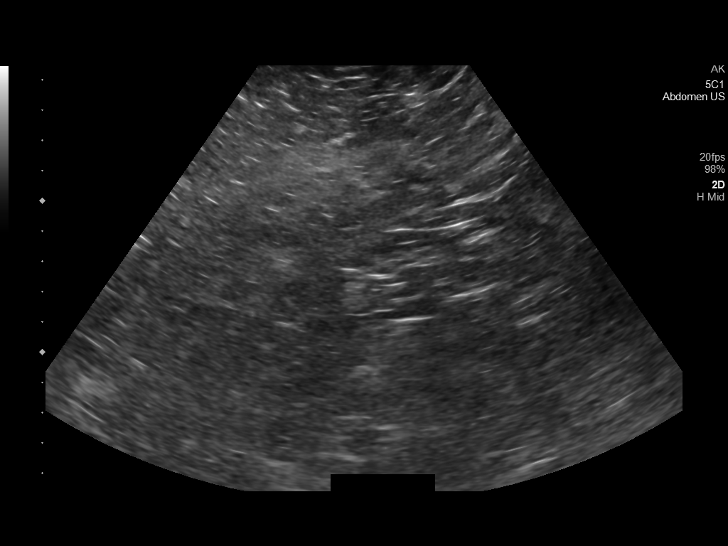
[im 11/22]
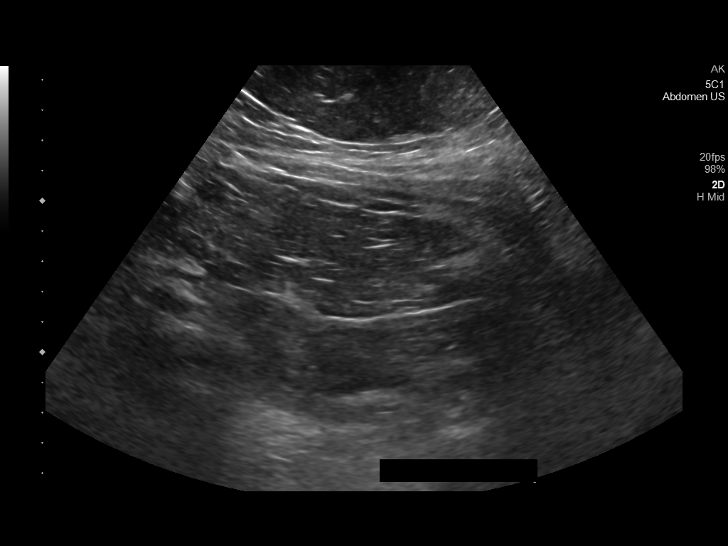
[im 12/22]
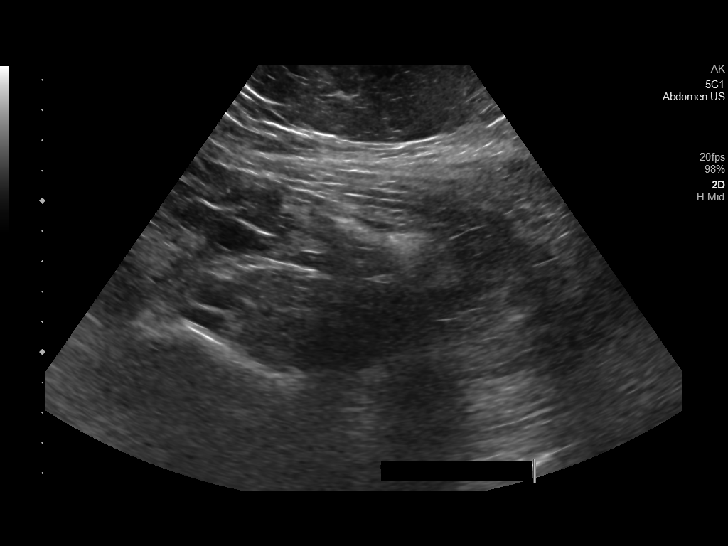
[im 14/22]
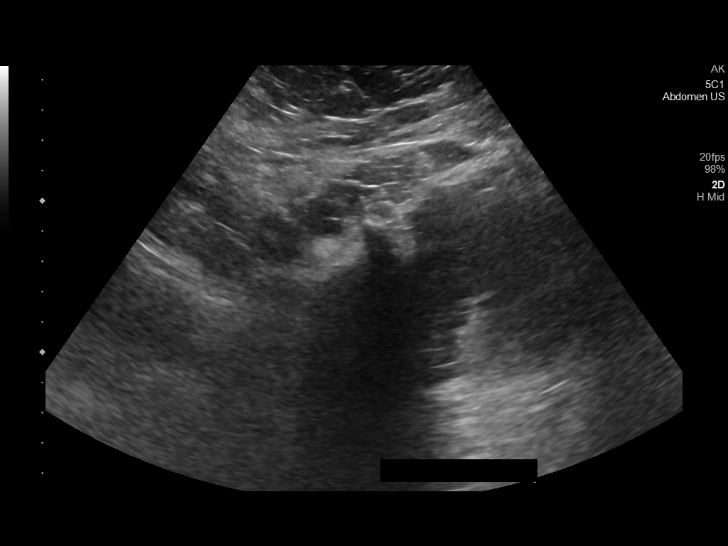
[im 15/22]
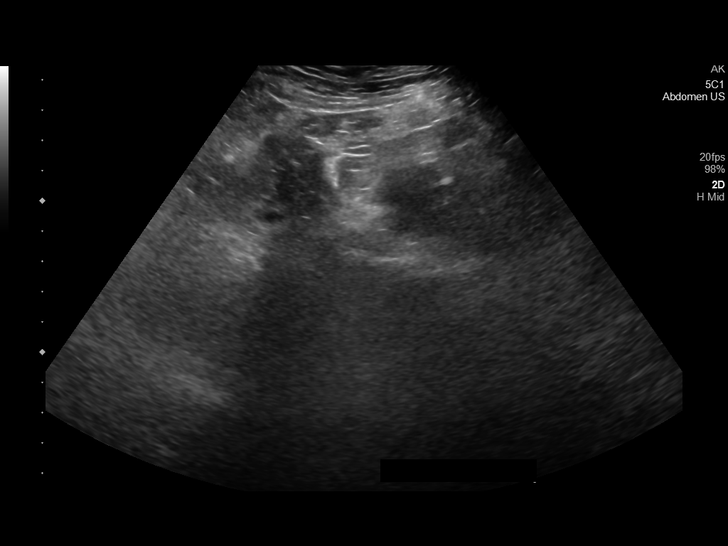
[im 17/22]
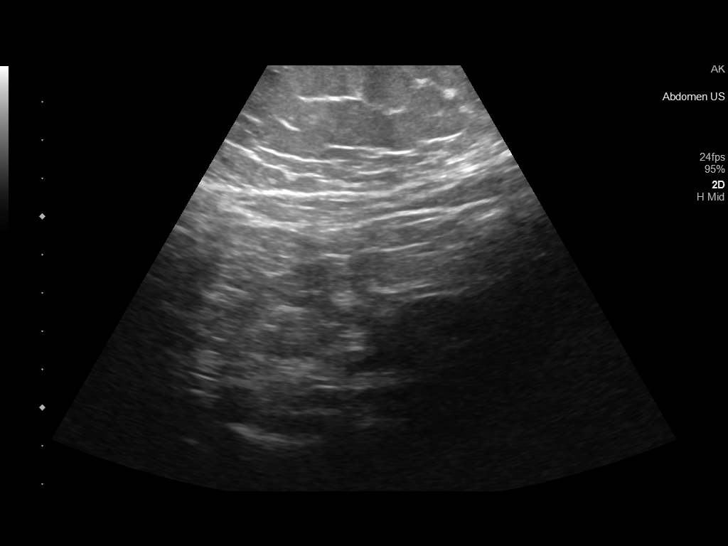
[im 19/22]
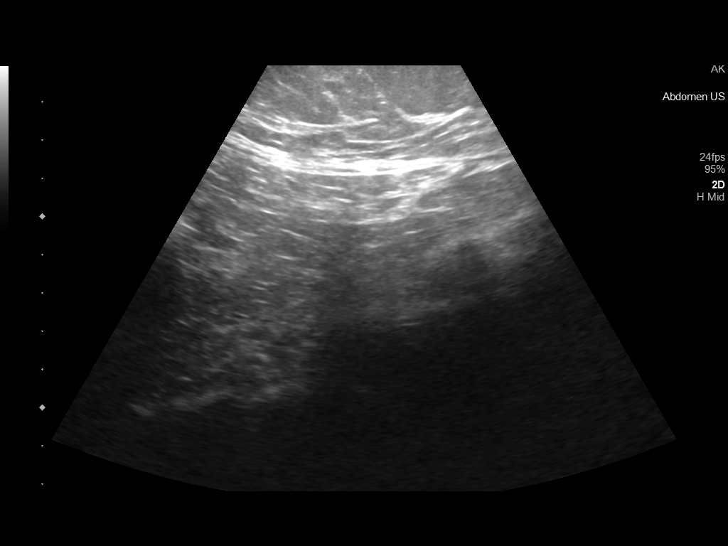
[im 20/22]
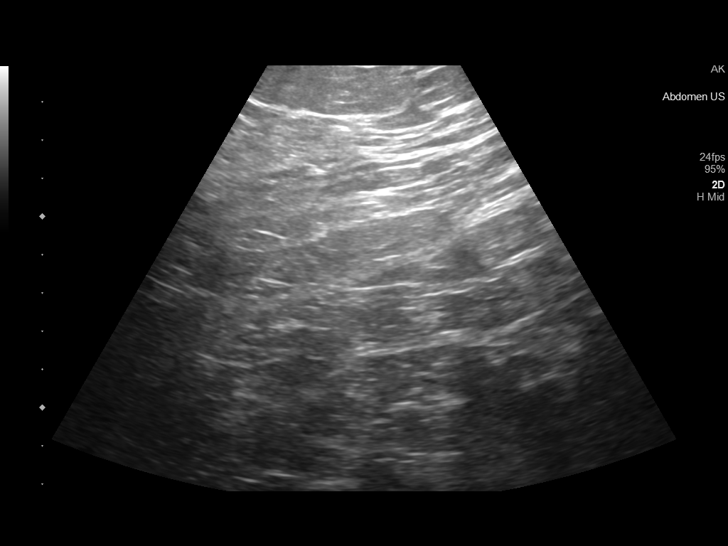
[im 22/22]
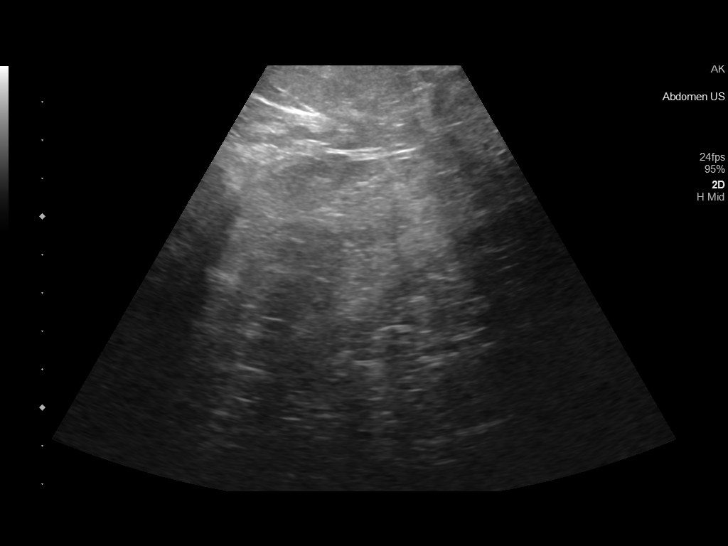

[14 of 22 positions shown; findings below may reference images not displayed]

FINDINGS: Peristalsing bowel seen in the left lower abdomen and pelvis. No
mass or adenopathy evident ultrasound. No abnormal fluid.
IMPRESSION: No lesions seen by ultrasound in the left lower quadrant/left pelvis
region. If symptoms persist, correlation with CT, ideally with oral
contrast, could be helpful to further evaluate.

## 2023-05-04 IMAGING — US US RENAL
1 series · 14 of 25 positions shown · non-contrast
Comparison: None.

CLINICAL DATA: Chronic left flank and left lower quadrant pain

EXAM:
RENAL / URINARY TRACT ULTRASOUND COMPLETE

[Series 1: us renal · 14 of 51 slices shown]
[im 1/51]
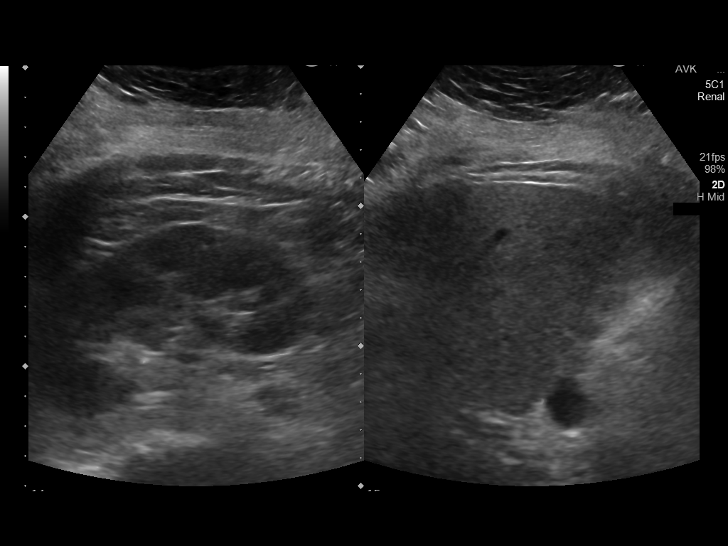
[im 5/51]
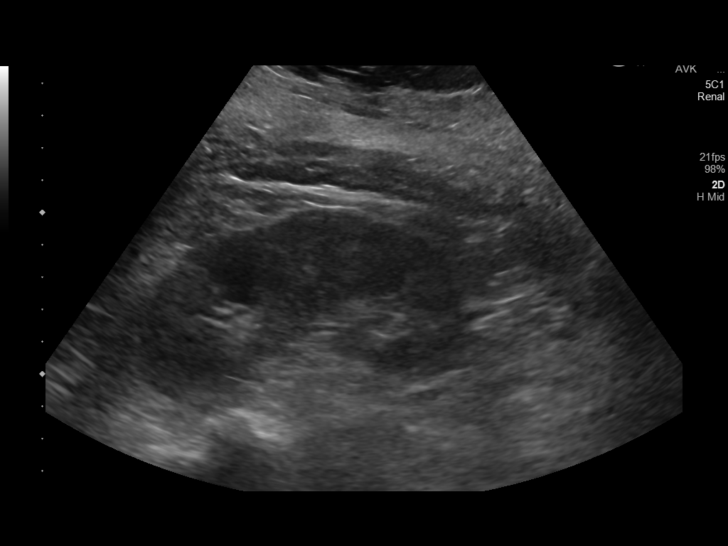
[im 9/51]
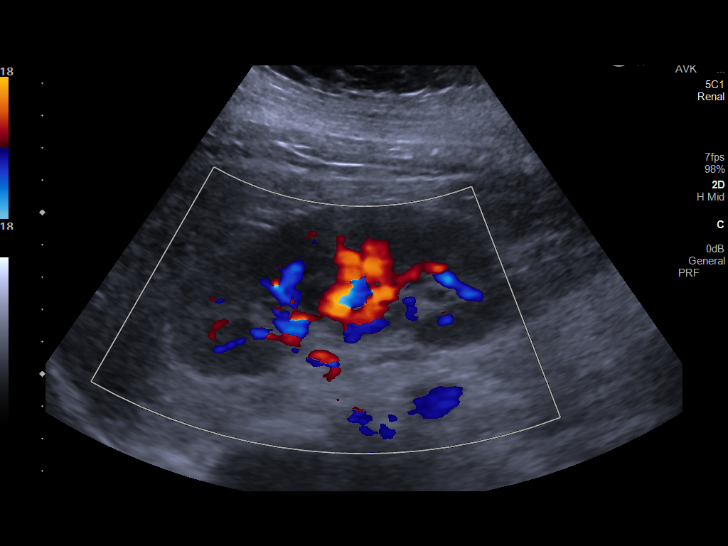
[im 13/51]
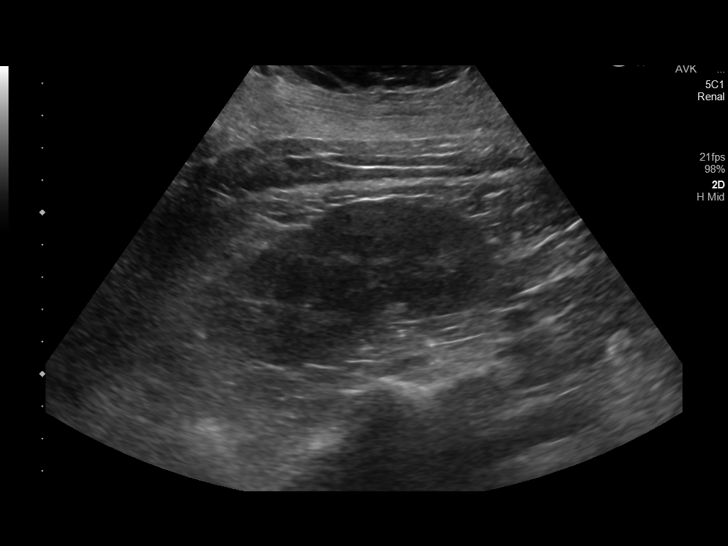
[im 17/51]
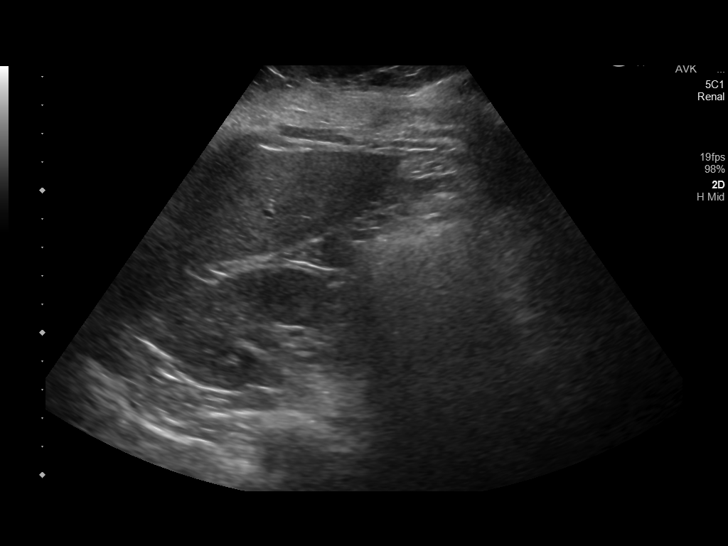
[im 19/51]
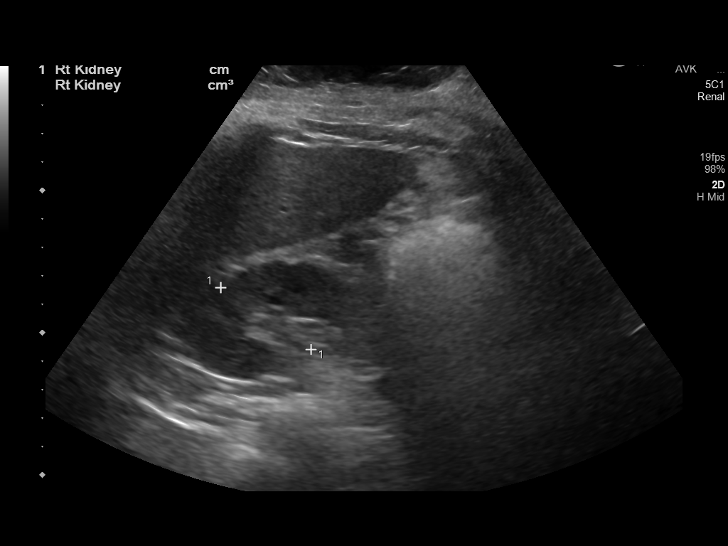
[im 23/51]
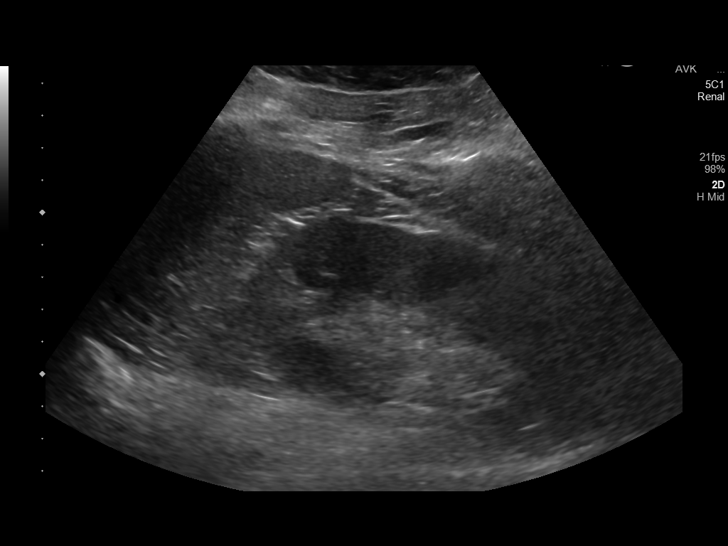
[im 28/51]
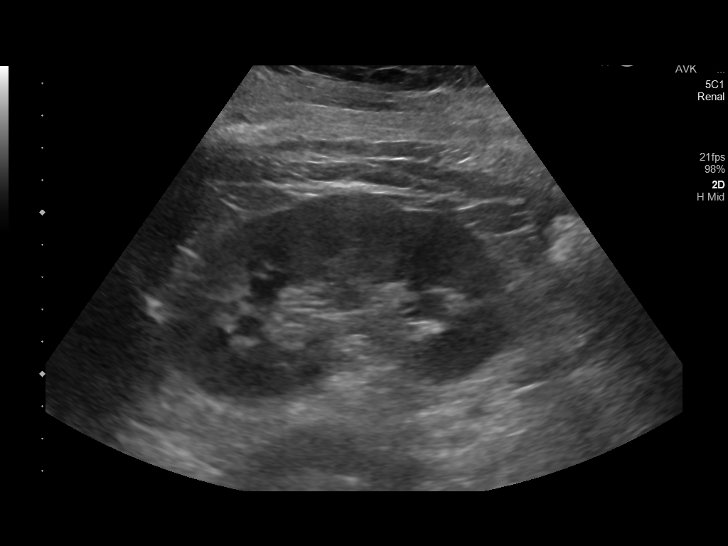
[im 32/51]
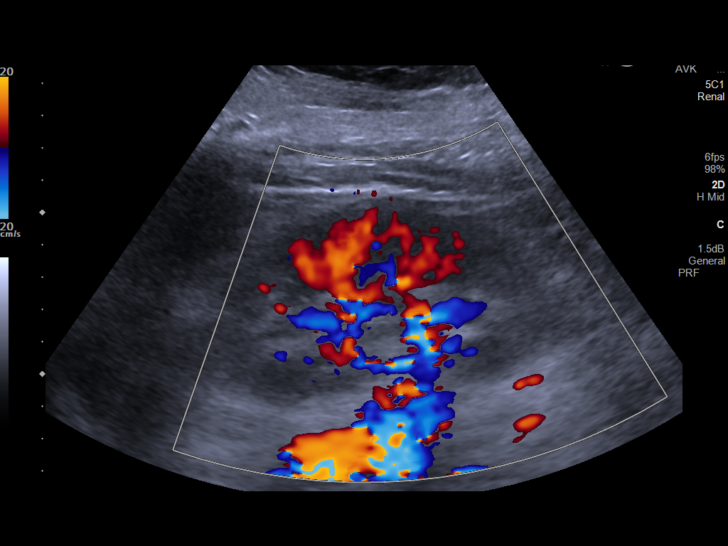
[im 34/51]
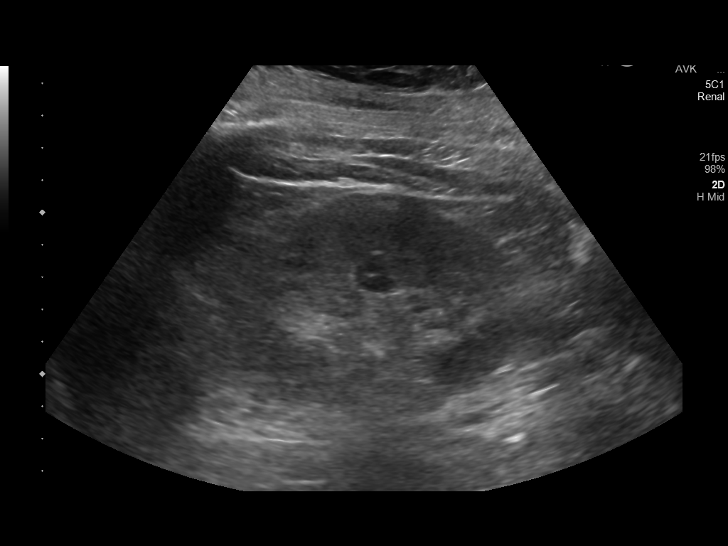
[im 38/51]
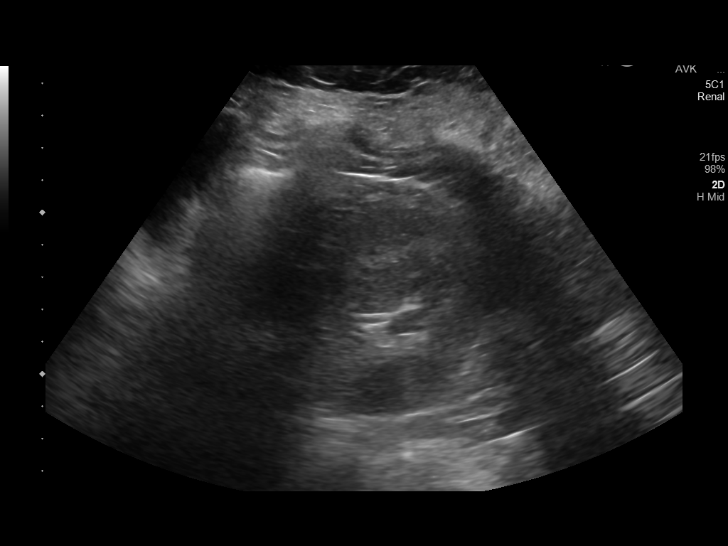
[im 42/51]
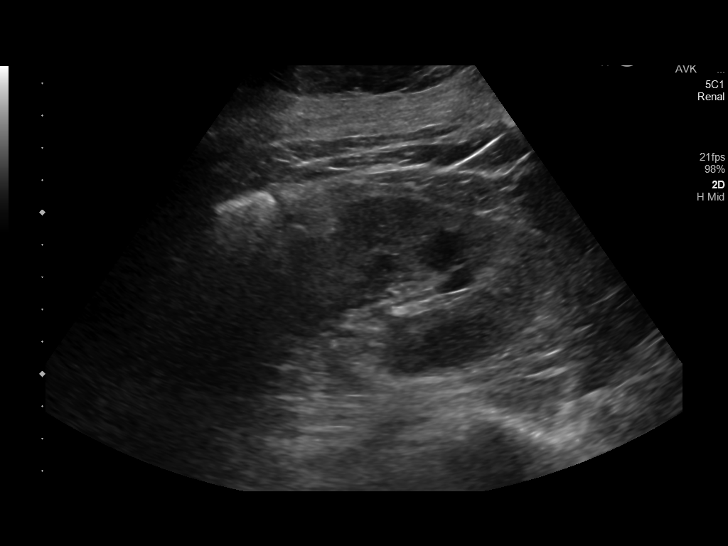
[im 46/51]
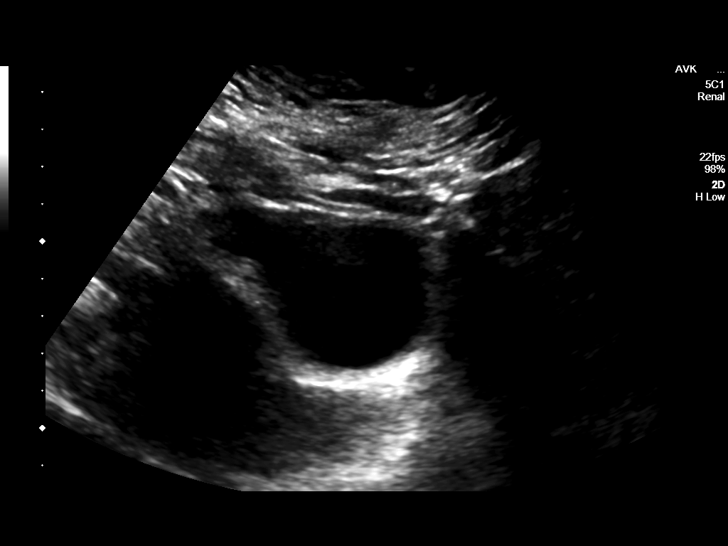
[im 51/51]
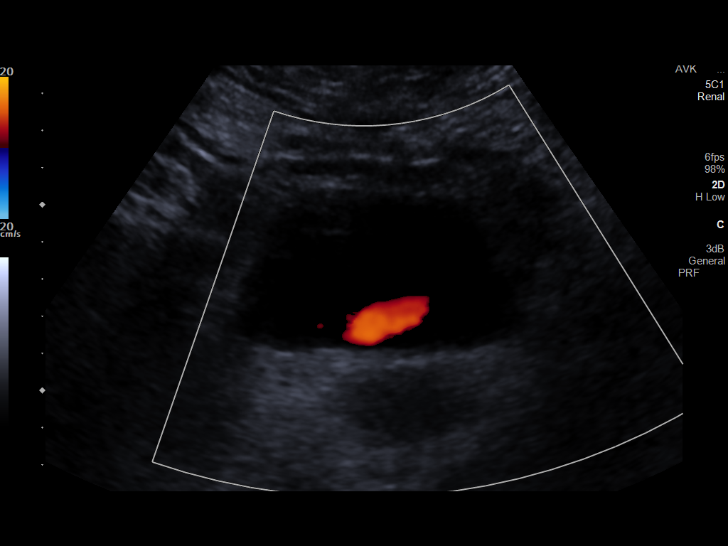

[14 of 25 positions shown; findings below may reference images not displayed]

FINDINGS: Right Kidney:

Renal measurements: 11.4 x 4.7 x 3.9 cm = volume: 109 mL.
Echogenicity within normal limits. No mass or hydronephrosis
visualized.

Left Kidney:

Renal measurements: 11.3 x 5.9 x 5.1 cm = volume: 178 mL.
Echogenicity within normal limits. No mass or hydronephrosis
visualized.

Bladder:

Appears normal for degree of bladder distention.

Other:

None.
IMPRESSION: No significant sonographic abnormality of the kidneys.

## 2023-05-06 NOTE — Unmapped (Signed)
[facility] Refill Coordination Note    Stephanie Hester, DOB: 1981/07/04  Phone: [phone] (home)       All above HIPAA information was verified with patient.         05/06/2023     3:46 PM   Specialty Rx Medication Refill Questionnaire   Which Medications would you like refilled and shipped? Humira   Please list all current allergies: None   Have you missed any doses in the last 30 days? No   Have you had any changes to your medication(s) since your last refill? No   How many days remaining of each medication do you have at home? 0   If receiving an injectable medication, next injection date is 05/15/2023   Have you experienced any side effects in the last 30 days? No   Please enter the full address (street address, city, state, zip code) where you would like your medication(s) to be delivered to. [address] [place] [address]   Please specify on which day you would like your medication(s) to arrive. Note: if you need your medication(s) within 3 days, please call the pharmacy to schedule your order at [phone]  05/08/2023   Has your insurance changed since your last refill? No   Would you like a pharmacist to call you to discuss your medication(s)? No   Do you require a signature for your package? (Note: if we are billing Medicare Part B or your order contains a controlled substance, we will require a signature) No         Completed refill call assessment today to schedule patient's medication shipment from the [facility] [phone]).  All relevant notes have been reviewed.       Confirmed patient received a Conservation officer, historic buildings and a Surveyor, mining with first shipment. The patient will receive a drug information handout for each medication shipped and additional FDA Medication Guides as required.         REFERRAL TO PHARMACIST     Referral to the pharmacist: Not needed      [facility]     Shipping address confirmed in Epic.     Delivery Scheduled: Yes, Expected medication delivery date: 05/08/2023.     Medication will be delivered via Same Day Courier to the prescription address in Epic WAM.    Dorisann Frames   [facility] Specialty Technician

## 2023-05-08 MED FILL — HUMIRA PEN CITRATE FREE 40 MG/0.4 ML: SUBCUTANEOUS | 28 days supply | Qty: 2 | Fill #3

## 2023-05-09 DIAGNOSIS — L409 Psoriasis, unspecified: Principal | ICD-10-CM

## 2023-05-15 ENCOUNTER — Ambulatory Visit: Admit: 2023-05-15 | Discharge: 2023-05-16 | Payer: PRIVATE HEALTH INSURANCE

## 2023-06-06 NOTE — Unmapped (Signed)
[facility] Refill Coordination Note    Stephanie Hester, DOB: January 12, 1981  Phone: [phone] (home)       All above HIPAA information was verified with patient.         06/03/2023     3:14 PM   Specialty Rx Medication Refill Questionnaire   Which Medications would you like refilled and shipped? Humira   Please list all current allergies: None   Have you missed any doses in the last 30 days? No   Have you had any changes to your medication(s) since your last refill? No   How many days remaining of each medication do you have at home? 0   If receiving an injectable medication, next injection date is 06/12/2023   Have you experienced any side effects in the last 30 days? No   Please enter the full address (street address, city, state, zip code) where you would like your medication(s) to be delivered to. [address] [place] [address]   Please specify on which day you would like your medication(s) to arrive. Note: if you need your medication(s) within 3 days, please call the pharmacy to schedule your order at [phone]  06/11/2023   Has your insurance changed since your last refill? No   Would you like a pharmacist to call you to discuss your medication(s)? No   Do you require a signature for your package? (Note: if we are billing Medicare Part B or your order contains a controlled substance, we will require a signature) No         Completed refill call assessment today to schedule patient's medication shipment from the [facility] [phone]).  All relevant notes have been reviewed.       Confirmed patient received a Conservation officer, historic buildings and a Surveyor, mining with first shipment. The patient will receive a drug information handout for each medication shipped and additional FDA Medication Guides as required.         REFERRAL TO PHARMACIST     Referral to the pharmacist: Not needed      [facility]     Shipping address confirmed in Epic.     Delivery Scheduled: Yes, Expected medication delivery date: 8/15.     Medication will be delivered via Same Day Courier to the prescription address in Epic WAM.    [name]   [facility] Specialty Technician

## 2023-06-11 MED FILL — HUMIRA PEN CITRATE FREE 40 MG/0.4 ML: SUBCUTANEOUS | 28 days supply | Qty: 2 | Fill #8

## 2023-06-21 MED ORDER — HUMIRA PEN CITRATE FREE 40 MG/0.4 ML
SUBCUTANEOUS | 10 refills | 28.00000 days
Start: 2023-06-21 — End: ?

## 2023-06-22 ENCOUNTER — Ambulatory Visit: Admit: 2023-06-22 | Discharge: 2023-06-23 | Payer: PRIVATE HEALTH INSURANCE

## 2023-06-22 DIAGNOSIS — L409 Psoriasis, unspecified: Principal | ICD-10-CM

## 2023-06-22 DIAGNOSIS — Z79899 Other long term (current) drug therapy: Principal | ICD-10-CM

## 2023-06-22 DIAGNOSIS — B353 Tinea pedis: Principal | ICD-10-CM

## 2023-06-22 MED ORDER — CLOBETASOL 0.05 % SCALP SOLUTION
Freq: Every day | TOPICAL | 5 refills | 0.00000 days | Status: CP
Start: 2023-06-22 — End: ?
  Filled 2023-07-07: qty 50, 20d supply, fill #0

## 2023-06-22 MED ORDER — HALOBETASOL PROPIONATE 0.05 % TOPICAL OINTMENT
Freq: Two times a day (BID) | TOPICAL | 4 refills | 0.00000 days | Status: CP
Start: 2023-06-22 — End: ?
  Filled 2023-07-07: qty 100, 30d supply, fill #0

## 2023-06-22 MED ORDER — CICLOPIROX 0.77 % TOPICAL CREAM
Freq: Two times a day (BID) | TOPICAL | 6 refills | 0.00000 days | Status: CP
Start: 2023-06-22 — End: 2024-06-21
  Filled 2023-07-07: qty 30, 10d supply, fill #0

## 2023-06-22 MED ORDER — HUMIRA PEN CITRATE FREE 40 MG/0.4 ML
SUBCUTANEOUS | 10 refills | 28.00000 days | Status: CP
Start: 2023-06-22 — End: ?
  Filled 2023-07-07: qty 2, 28d supply, fill #0

## 2023-06-22 NOTE — Unmapped (Signed)
Dermatology Note     Assessment and Plan:    Proceeded with the visit in Spanish as a native [place] speaker and certified bilingual provider.     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 Joint decision was made to re-assess in 1-2 months; if worsening or evident recurrent hand joint swelling, will proceed with switching to either IL-17 or IL-23 inhibitor.  - Continue clobetasoL (TEMOVATE) 0.05 % external solution; Apply topically daily. To rash on scalp as needed for rash. Refilled today  - Continue halobetasol (ULTRAVATE) 0.05 % ointment; Apply topically Two (2) times a day. Onto rash on body for 2 weeks at a time as needed for rash. Stop after clearance of rash. Refilled today  - Continue HUMIRA PEN CITRATE FREE 40 MG/0.4 ML; Inject 0.4 mL (40 mg total) under the skin every fourteen (14) days. Refilled today  - Reviewed side effects of topical corticosteroids and appropriate use. Reviewed recommendation for use of clobetasol solution only as needed for flares; explained that if folliculitis flares as below to discontinue the topical steroid.     High risk medication use (adalimumab (Humira))  - Denied side effects or complications today.  - Quant gold from 07/21/22 negative.  - Repeat quant gold today    Tinea pedis of left foot: recurrent, active today, previously responded to topical antifungal  - previously resolved with treatment using topical antifungal cream but recurred over the past couple of months  - Diagnosis, prognosis, treatment options including prescription medications, risks/benefits, and side effects were discussed with the patient. The patient made the informed decision to treat as orders below.  - KOH positive for hyphae on 09/2022 visit   - restart ciclopirox (LOPROX) 0.77 % cream; Apply topically two (2) times a day. Gently massage into affected areas and surrounding skin. Apply to left foot and in between the toes. Sent new prescription    Folliculitis-- resolved   - no further treatment    Risk of complications and/or morbidity and mortality associated with the management of plaque psoriasis is considered high due to:  Prescription drug management, Drug therapy requiring intensive monitoring for toxicity, and Medication induced immunosuppression     The patient was advised to call for an appointment should any new, changing, or symptomatic lesions develop.     RTC: Return in about 2 months (around 08/22/2023) for In-person. or sooner as needed   _________________________________________________________________      Chief Complaint     Chief Complaint   Patient presents with    Follow-up     Follow-up for plaque psoriasis on foot. Patient states that the area is improved a lot.       HPI     Stephanie Hester is a 42 y.o. female who presents as a returning patient (last seen 09/29/2022) to Dermatology for follow up of psoriasis. Her skin psoriasis has been  stable with only needing to use the topical steroids once a week on small areas on elbows and knees, while undergoing treatment with humira. She has started a new job and thinks this is causing some hand numbness and discomfort in the mornings.    The patient denies any other new or changing lesions or areas of concern.     Pertinent Past Medical History     Problem List       Psoriasis Currently taking Adalimumab q 2 weeks with good response. Followed by [facility] Dermatology/Rheumatology-->last visit on 08/07/20. Recommendations at that time:    Psoriasis  - Patient has previously tried and failed topical steroids  - Patient is now pregnant (did have recent miscarriage prior to starting adalimumab). She was originally prescribed ustekinumab but this was denied  - Patient has been taking Adalimumab (stopped 1 dose due to new rash). Discussed that from dermatologic perspective, we are agreeable to continuing this medication during pregnancy but defer to OBGYN. Patient plans to establish care with high risk OBGYN for her pre natal care  - recommend start clobetasol 0.05% ointment bid to affected area until flat and smooth    Tumor necrosis factor (TNF)-alpha blockers may be used in women who require these medications for the maintenance or establishment of control of active inflammatory disease during pregnancy. Some expert guidance suggests these medications should be discontinued in the late second or early third trimester. However, use of these drugs can be extended, if necessary, to a later gestational age if benefits outweigh potential risks for an individual patient.     -08/07/20: Quant gold negative, HIV: non-reactive, Hep C ab: non-reactive, Hep B surf ab: non-reactive, Hep B core ab: non-reactive  -Pt is s/p 2nd dose of Moderna COVID-19 vaccination. Considering influenza vaccination at next visit (declined today).  -Will continue Adalimumab at this time  [ ]  Follow-up with dermatology on 11/13/20         Relevant Medications    HUMIRA PEN CITRATE FREE 40 MG/0.4 ML    clobetasol (TEMOVATE) 0.05 % external solution    halobetasol (ULTRAVATE) 0.05 % ointment    Other Relevant Orders    Quantiferon TB Gold Plus     Diabetes mellitus  Hypothyroidism  Hepatic steatosis  Psoriasis    Family History:   Sister has Psoriasis c/w Psoriatic Arthritis currently on an infusion medication after failing treatment to injectables.    Past Medical History, Family History, Social History, Medication List, Allergies, and Problem List were reviewed in the rooming section of Epic.     ROS: Other than symptoms mentioned in the HPI, no fevers, chills, or other skin complaints    Physical Examination     GENERAL: Well-appearing female in no acute distress, resting comfortably.  NEURO: Alert and oriented, answers questions appropriately  PSYCH: Normal mood and affect  SKIN: Examination of the left foot, hair, hands, sun exposed portions of upper extremities, and sun exposed portions of lower extremities was performed   - A few well-demarcated erythematous plaques with micaceous silvery scale on the extremities with a predilection for extensor surfaces on elbows and knees  - Right fingernail with distal onychodystrophy   - Scaling and mild erythema of the soles and some maceration between the toes predominantly on left foot.      All areas not commented on are within normal limits or unremarkable    (Approved Template 07/09/2020)

## 2023-06-23 ENCOUNTER — Ambulatory Visit: Admit: 2023-06-23 | Discharge: 2023-06-24 | Payer: PRIVATE HEALTH INSURANCE

## 2023-06-26 LAB — TB AG1: TB AG1 VALUE: 0.04

## 2023-06-26 LAB — QUANTIFERON TB GOLD PLUS
QUANTIFERON ANTIGEN 1 MINUS NIL: 0.04 [IU]/mL
QUANTIFERON ANTIGEN 2 MINUS NIL: 0.05 [IU]/mL
QUANTIFERON MITOGEN: 10 [IU]/mL
QUANTIFERON TB GOLD PLUS: NEGATIVE
QUANTIFERON TB NIL VALUE: 0 [IU]/mL

## 2023-06-26 LAB — TB AG2: TB AG2 VALUE: 0.05

## 2023-06-26 LAB — TB NIL: TB NIL VALUE: 0

## 2023-06-26 LAB — TB MITOGEN: TB MITOGEN VALUE: 10

## 2023-07-06 NOTE — Unmapped (Signed)
[facility] Refill Coordination Note    Specialty Medication(s) to be Shipped:   Inflammatory Disorders: Humira    Other medication(s) to be shipped: No additional medications requested for fill at this time     Stephanie Hester, DOB: 07-25-1981  Phone: [phone] (home)       All above HIPAA information was verified with patient.     Was a Nurse, learning disability used for this call? No    Completed refill call assessment today to schedule patient's medication shipment from the [facility] [phone]).  All relevant notes have been reviewed.     Specialty medication(s) and dose(s) confirmed: Regimen is correct and unchanged.   Changes to medications: Lenita reports no changes at this time.  Changes to insurance: No  New side effects reported not previously addressed with a pharmacist or physician: None reported  Questions for the pharmacist: No    Confirmed patient received a Conservation officer, historic buildings and a Surveyor, mining with first shipment. The patient will receive a drug information handout for each medication shipped and additional FDA Medication Guides as required.       DISEASE/MEDICATION-SPECIFIC INFORMATION        For patients on injectable medications: Patient currently has 0 doses left.  Next injection is scheduled for 07/10/23.    SPECIALTY MEDICATION ADHERENCE     Medication Adherence    Patient reported X missed doses in the last month: 0  Specialty Medication: HUMIRA(CF) PEN 40 mg/0.4 mL injection (adalimumab)  Patient is on additional specialty medications: No  Patient is on more than two specialty medications: No  Any gaps in refill history greater than 2 weeks in the last 3 months: no  Demonstrates understanding of importance of adherence: yes  Informant: patient  Reliability of informant: reliable  Provider-estimated medication adherence level: good  Patient is at risk for Non-Adherence: No  Reasons for non-adherence: no problems identified Were doses missed due to medication being on hold? No    HUMIRA(CF) PEN 40 mg/0.4 mL injection (adalimumab)  : 0 doses of medicine on hand       REFERRAL TO PHARMACIST     Referral to the pharmacist: Not needed      SHIPPING     Shipping address confirmed in Epic.       Delivery Scheduled: Yes, Expected medication delivery date: 07/08/23.     Medication will be delivered via UPS to the prescription address in Epic WAM.    [facility] Specialty Technician

## 2023-08-04 NOTE — Unmapped (Signed)
[facility] Refill Coordination Note    Stephanie Hester, DOB: 11-09-1980  Phone: [phone] (home)       All above HIPAA information was verified with patient.         08/02/2023    10:37 AM   Specialty Rx Medication Refill Questionnaire   Which Medications would you like refilled and shipped? Humira   Please list all current allergies: None   Have you missed any doses in the last 30 days? No   Have you had any changes to your medication(s) since your last refill? No   How many days remaining of each medication do you have at home? 0   If receiving an injectable medication, next injection date is 08/07/2023   Have you experienced any side effects in the last 30 days? No   Please enter the full address (street address, city, state, zip code) where you would like your medication(s) to be delivered to. [address] [place] [address]   Please specify on which day you would like your medication(s) to arrive. Note: if you need your medication(s) within 3 days, please call the pharmacy to schedule your order at [phone]  08/05/2023   Has your insurance changed since your last refill? No   Would you like a pharmacist to call you to discuss your medication(s)? No   Do you require a signature for your package? (Note: if we are billing Medicare Part B or your order contains a controlled substance, we will require a signature) No         Completed refill call assessment today to schedule patient's medication shipment from the [facility] [phone]).  All relevant notes have been reviewed.       Confirmed patient received a Conservation officer, historic buildings and a Surveyor, mining with first shipment. The patient will receive a drug information handout for each medication shipped and additional FDA Medication Guides as required.         REFERRAL TO PHARMACIST     Referral to the pharmacist: Not needed      [facility]     Shipping address confirmed in Epic. Delivery Scheduled: Yes, Expected medication delivery date: 10/10.     Medication will be delivered via Same Day Courier to the prescription address in Epic WAM.    [facility]

## 2023-08-06 MED FILL — HUMIRA PEN CITRATE FREE 40 MG/0.4 ML: SUBCUTANEOUS | 28 days supply | Qty: 2 | Fill #1

## 2023-08-31 ENCOUNTER — Ambulatory Visit: Admit: 2023-08-31 | Discharge: 2023-09-01 | Payer: PRIVATE HEALTH INSURANCE

## 2023-08-31 DIAGNOSIS — Z79899 Other long term (current) drug therapy: Principal | ICD-10-CM

## 2023-08-31 DIAGNOSIS — L409 Psoriasis, unspecified: Principal | ICD-10-CM

## 2023-08-31 NOTE — Unmapped (Unsigned)
Dermatology Note     Assessment and Plan:      Plaque Psoriasis: recently presenting with evolving PsA-like symptoms of hands stable since onset  Acuity: chronic, progressing, not at treatment goal due to evolving joint disease   Cutaneous disease activity while on adalimumab (Humira) <5% BSA     Patient refers symptoms of hand tightness, with some limited restriction/range of movement, discomfort that happens mostly during the day is unclear if early evolving psoriatic arthritis versus other inflammatory etiology of hand joints.  Swelling reported as mild and and unclear if consistent every day.  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  After an extensive discussion with the patient regarding joint symptoms in setting of plaque psoriasis and extensive family history of psoriasis and psoriatic arthritis, talked about alternative biologic options.  After answering her questions and a detailed discussion, patient made informed decision to continue treatment with the Humira for another 3 months and monitor at home for changes of hand joint symptoms.  Patient agreed to report back if any worsening of symptoms prior to next scheduled appointment.     Plan:   - Continue clobetasoL (TEMOVATE) 0.05 % external solution; Apply topically daily. To rash on scalp as needed for rash. Refilled today  - Continue halobetasol (ULTRAVATE) 0.05 % ointment; Apply topically Two (2) times a day. Onto rash on body for 2 weeks at a time as needed for rash. Stop after clearance of rash. Refilled today  - Continue HUMIRA PEN CITRATE FREE 40 MG/0.4 ML; Inject 0.4 mL (40 mg total) under the skin every fourteen (14) days. Refilled today  - Reviewed side effects of topical corticosteroids and appropriate use. Reviewed recommendation for use of clobetasol solution only as needed for flares; explained that if folliculitis flares as below to discontinue the topical steroid.      High risk medication use (adalimumab (Humira))  - Denied side effects or complications today.  - Quant gold from 07/21/22 negative.  - Repeat quant gold 05/2023 negative    Risk of complications and/or morbidity and mortality associated with the management of plaque psoriasis is considered high due to:  Prescription drug management, Drug therapy requiring intensive monitoring for toxicity, and Medication induced immunosuppression .    The patient was advised to call for an appointment should any new, changing, or symptomatic lesions develop.     RTC: Return in about 3 months (around 12/01/2023) for In-person. or sooner as needed   _________________________________________________________________      Chief Complaint     Chief Complaint   Patient presents with    Psoriasis     Pt c/o follow up visit, pt states that is doing so much better, she only has slight areas on her elbows and states that her knees are almost clear.       HPI     Stephanie Hester is a 42 y.o. female who presents as a returning patient (last seen 06/22/2023) to Dermatology for follow up of psoriasis. Patient would like to discuss hand joint symptoms and recommendation regarding the humira. Her skin remains almost completley clear.     The patient denies any other new or changing lesions or areas of concern.     Pertinent Past Medical History     Problem List       Psoriasis - Primary     Currently  taking Adalimumab q 2 weeks with good response. Followed by [facility] Dermatology/Rheumatology-->last visit on 08/07/20. Recommendations at that time:    Psoriasis  - Patient has previously tried and failed topical steroids  - Patient is now pregnant (did have recent miscarriage prior to starting adalimumab). She was originally prescribed ustekinumab but this was denied  - Patient has been taking Adalimumab (stopped 1 dose due to new rash). Discussed that from dermatologic perspective, we are agreeable to continuing this medication during pregnancy but defer to OBGYN. Patient plans to establish care with high risk OBGYN for her pre natal care  - recommend start clobetasol 0.05% ointment bid to affected area until flat and smooth    Tumor necrosis factor (TNF)-alpha blockers may be used in women who require these medications for the maintenance or establishment of control of active inflammatory disease during pregnancy. Some expert guidance suggests these medications should be discontinued in the late second or early third trimester. However, use of these drugs can be extended, if necessary, to a later gestational age if benefits outweigh potential risks for an individual patient.     -08/07/20: Quant gold negative, HIV: non-reactive, Hep C ab: non-reactive, Hep B surf ab: non-reactive, Hep B core ab: non-reactive  -Pt is s/p 2nd dose of Moderna COVID-19 vaccination. Considering influenza vaccination at next visit (declined today).  -Will continue Adalimumab at this time  [ ]  Follow-up with dermatology on 11/13/20          Diabetes mellitus  Hypothyroidism  Hepatic steatosis  Psoriasis     Family History:   Sister with psoriasis and PsA    Past Medical History, Family History, Social History, Medication List, Allergies, and Problem List were reviewed in the rooming section of Epic.     ROS: Other than symptoms mentioned in the HPI, no fevers, chills, or other skin complaints    Physical Examination     GENERAL: Well-appearing female in no acute distress, resting comfortably.  NEURO: Alert and oriented, answers questions appropriately  PSYCH: Normal mood and affect  SKIN (Focal Skin Exam): Per patient request, examination of forearms, elbows, face, hands was performed  - some skin colored papules with white scaly on bilateral elbows    All areas not commented on are within normal limits or unremarkable    (Approved Template 07/09/2020) complaints}    Physical Examination     GENERAL: Well-appearing female in no acute distress, resting comfortably.  {PE systems:75418::NEURO: Alert and oriented, answers questions appropriately}  {PE extent:75514}  {PE list:75421}  ***    {PE limitations:75419::All areas not commented on are within normal limits or unremarkable}      (Approved Template 07/09/2020)

## 2023-09-02 NOTE — Unmapped (Signed)
[facility] Refill Coordination Note    Specialty Medication(s) to be Shipped:   Inflammatory Disorders: Humira    Other medication(s) to be shipped:  ciclopirox 0.77% cream, clobetasol 0.05 % external solution , halobetasol 0.05% ointment     Stephanie Hester, DOB: 02-17-1981  Phone: [phone] (home)       All above HIPAA information was verified with patient.     Was a Nurse, learning disability used for this call? No    Completed refill call assessment today to schedule patient's medication shipment from the [facility] [phone]).  All relevant notes have been reviewed.     Specialty medication(s) and dose(s) confirmed: Regimen is correct and unchanged.   Changes to medications: Stephanie Hester reports no changes at this time.  Changes to insurance: No  New side effects reported not previously addressed with a pharmacist or physician: None reported  Questions for the pharmacist: No    Confirmed patient received a Conservation officer, historic buildings and a Surveyor, mining with first shipment. The patient will receive a drug information handout for each medication shipped and additional FDA Medication Guides as required.       DISEASE/MEDICATION-SPECIFIC INFORMATION        For patients on injectable medications: Patient currently has 0 doses left.  Next injection is scheduled for 09/04/23.    SPECIALTY MEDICATION ADHERENCE     Medication Adherence    Patient reported X missed doses in the last month: 0  Specialty Medication: HUMIRA PEN CITRATE FREE 40 MG/0.4 ML  Patient is on additional specialty medications: No  Informant: patient              Were doses missed due to medication being on hold? No    HUMIRA(CF) PEN 40 mg/0.4 mL injection (adalimumab)  : 0 doses of medicine on hand       REFERRAL TO PHARMACIST     Referral to the pharmacist: Not needed      SHIPPING     Shipping address confirmed in Epic.       Delivery Scheduled: Yes, Expected medication delivery date: 09/04/23.     Medication will be delivered via Same Day Courier to the prescription address in Epic WAM.    Stephanie Hester   [facility] Specialty Technician

## 2023-09-04 MED FILL — CICLOPIROX 0.77 % TOPICAL CREAM: TOPICAL | 10 days supply | Qty: 30 | Fill #1

## 2023-09-04 MED FILL — HUMIRA PEN CITRATE FREE 40 MG/0.4 ML: SUBCUTANEOUS | 28 days supply | Qty: 2 | Fill #2

## 2023-09-04 MED FILL — CLOBETASOL 0.05 % SCALP SOLUTION: TOPICAL | 20 days supply | Qty: 50 | Fill #1

## 2023-09-04 MED FILL — HALOBETASOL PROPIONATE 0.05 % TOPICAL OINTMENT: TOPICAL | 30 days supply | Qty: 100 | Fill #1

## 2023-10-01 NOTE — Unmapped (Signed)
[facility] Refill Coordination Note    Specialty Medication(s) to be Shipped:   Inflammatory Disorders: Humira    Other medication(s) to be shipped: No additional medications requested for fill at this time     Stephanie Hester, DOB: 07/25/1981  Phone: [phone] (home)       All above HIPAA information was verified with patient.     Was a Nurse, learning disability used for this call? No    Completed refill call assessment today to schedule patient's medication shipment from the [facility] [phone]).  All relevant notes have been reviewed.     Specialty medication(s) and dose(s) confirmed: Regimen is correct and unchanged.   Changes to medications: Stephanie Hester reports no changes at this time.  Changes to insurance: No  New side effects reported not previously addressed with a pharmacist or physician: None reported  Questions for the pharmacist: No    Confirmed patient received a Conservation officer, historic buildings and a Surveyor, mining with first shipment. The patient will receive a drug information handout for each medication shipped and additional FDA Medication Guides as required.       DISEASE/MEDICATION-SPECIFIC INFORMATION        For patients on injectable medications: Patient currently has 0 doses left.  Next injection is scheduled for 10/02/23.    SPECIALTY MEDICATION ADHERENCE     Medication Adherence    Patient reported X missed doses in the last month: 0  Specialty Medication: HUMIRA PEN CITRATE FREE 40 MG/0.4 ML  Patient is on additional specialty medications: No  Informant: patient              Were doses missed due to medication being on hold? No    HUMIRA(CF) PEN 40 mg/0.4 mL injection (adalimumab)  : 0 doses of medicine on hand       REFERRAL TO PHARMACIST     Referral to the pharmacist: Not needed      SHIPPING     Shipping address confirmed in Epic.       Delivery Scheduled: Yes, Expected medication delivery date: 10/02/23.     Medication will be delivered via Same Day Courier to the prescription address in Epic WAM.    [name]   [facility] Specialty Technician

## 2023-10-02 MED FILL — HUMIRA PEN CITRATE FREE 40 MG/0.4 ML: SUBCUTANEOUS | 28 days supply | Qty: 2 | Fill #3

## 2023-10-27 NOTE — Unmapped (Signed)
[facility] Specialty and Home Delivery Pharmacy Refill Coordination Note    Stephanie Hester, DOB: 06-Jul-1981  Phone: [phone] (home)       All above HIPAA information was verified with patient.         10/26/2023     9:55 PM   Specialty Rx Medication Refill Questionnaire   Which Medications would you like refilled and shipped? Humira   Please list all current allergies: None   Have you missed any doses in the last 30 days? No   Have you had any changes to your medication(s) since your last refill? No   How many days remaining of each medication do you have at home? 0   If receiving an injectable medication, next injection date is 10/28/2023   Have you experienced any side effects in the last 30 days? No   Please enter the full address (street address, city, state, zip code) where you would like your medication(s) to be delivered to. [address] [place]  [address]   Please specify on which day you would like your medication(s) to arrive. Note: if you need your medication(s) within 3 days, please call the pharmacy to schedule your order at [phone]  10/29/2023   Has your insurance changed since your last refill? No   Would you like a pharmacist to call you to discuss your medication(s)? No   Do you require a signature for your package? (Note: if we are billing Medicare Part B or your order contains a controlled substance, we will require a signature) Yes         Completed refill call assessment today to schedule patient's medication shipment from the [facility] and [facility] Delivery Pharm[facility] [phone]).  All relevant notes have been reviewed.       Confirmed patient received a Conservation officer, historic buildings and a Surveyor, mining with first shipment. The patient will receive a drug information handout for each medication shipped and additional FDA Medication Guides as required.         REFERRAL TO PHARMACIST     Referral to the pharmacist: Not needed      [facility] [address]     Shipping address confirmed in Epic. Delivery Scheduled: Yes, Expected medication delivery date: 10/29/23.     Medication will be delivered via Same Day Courier to the prescription address in Epic WAM.    Tobi Bastos, PharmD   [facility] Special[facility] and Home Delivery Pharmacy Specialty Pharmacist

## 2023-10-29 MED FILL — HUMIRA PEN CITRATE FREE 40 MG/0.4 ML: SUBCUTANEOUS | 28 days supply | Qty: 2 | Fill #4

## 2023-11-26 NOTE — Unmapped (Signed)
[facility] Specialty and Home Delivery Pharmacy Refill Coordination Note    Stephanie Hester, DOB: 29-Nov-1980  Phone: [phone] (home)       All above HIPAA information was verified with patient.         11/26/2023     9:48 AM   Specialty Rx Medication Refill Questionnaire   Which Medications would you like refilled and shipped? Humira   Please list all current allergies: None   Have you missed any doses in the last 30 days? No   Have you had any changes to your medication(s) since your last refill? No   How many days remaining of each medication do you have at home? 0   If receiving an injectable medication, next injection date is 11/27/2023   Have you experienced any side effects in the last 30 days? No   Please enter the full address (street address, city, state, zip code) where you would like your medication(s) to be delivered to. [address] [place] [address]   Please specify on which day you would like your medication(s) to arrive. Note: if you need your medication(s) within 3 days, please call the pharmacy to schedule your order at [phone]  11/27/2023   Has your insurance changed since your last refill? No   Would you like a pharmacist to call you to discuss your medication(s)? No   Do you require a signature for your package? (Note: if we are billing Medicare Part B or your order contains a controlled substance, we will require a signature) No         Completed refill call assessment today to schedule patient's medication shipment from the [facility] Specialty and Home Delivery Pharmacy [phone]).  All relevant notes have been reviewed.       Confirmed patient received a Conservation officer, historic buildings and a Surveyor, mining with first shipment. The patient will receive a drug information handout for each medication shipped and additional FDA Medication Guides as required.         REFERRAL TO PHARMACIST     Referral to the pharmacist: Not needed      [facility]     Shipping address confirmed in Epic. Delivery Scheduled: Yes, Expected medication delivery date: 1/31.     Medication will be delivered via Same Day Courier to the prescription address in Epic WAM.    Stephanie Hester Specialty and [facility]

## 2023-11-27 MED FILL — HUMIRA PEN CITRATE FREE 40 MG/0.4 ML: SUBCUTANEOUS | 28 days supply | Qty: 2 | Fill #5

## 2023-12-18 NOTE — Unmapped (Signed)
 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 Patient without known history or family history of IBD. Patient in agreement to proceed with IL-17 inhibitor therapy. Patient had been reading about Ixekizumab Altamease Oiler) and would like to try than one specifically first.     Plan:   - Continue clobetasoL (TEMOVATE) 0.05 % external solution; Apply topically daily. To rash on scalp as needed for rash.  Not needing refills today.  - Continue halobetasol (ULTRAVATE) 0.05 % ointment; Apply topically Two (2) times a day. Onto rash on body for 2 weeks at a time as needed for rash. Stop after clearance of rash. Not needing refills today.  - Continue adalimumab (Humira) 40 mg q2w until starting new biologic.    - start ixekizumab (TALTZ AUTOINJECTOR) 80 mg/mL AtIn auto-injector; SubQ: 160 mg once, followed by 80 mg at weeks 2, 4, 6, 8, 10 and 12, and then 80 mg every 4 weeks.  Dispense: 8 mL; Refill: 0  - start ixekizumab (TALTZ) 80 mg/mL AtIn auto-injector; Inject 80 mg under the skin every 4 weeks.  Dispense: 1 mL; Refill: 9    High risk medication use (adalimumab (Humira))  - Denied side effects or complications today.  - Quant gold from 07/21/22 negative.  - Repeat quant gold 05/2023 negative    Risk of complications and/or morbidity and mortality associated with the management of plaque psoriasis is considered high due to:  Prescription drug management, Drug therapy requiring intensive monitoring for toxicity, and Medication induced immunosuppression .    The patient was advised to call for an appointment should any new, changing, or symptomatic lesions develop.     RTC: Return in about 2 months (around 02/18/2024) for In-person. or sooner as needed   _________________________________________________________________      Chief Complaint     Chief Complaint   Patient presents with    Psoriasis     Bilateral elbows and knees only, pt remarks it is stable        HPI     Stephanie Hester is a 43 y.o. female who presents as a returning patient (last seen 08/31/2023) to Dermatology for follow up of psoriasis.     During the last visit, we had discussed regarding her potential hand joint symptoms and for her to monitor her hand symptoms at home. Also discussed potential changes to adalimumab (Humira) as systemic treatment if uncontrolled hand joint arthritic symptoms.    The patient denies any other new or changing lesions or areas of concern.     Pertinent Past Medical History     Problem List       Psoriasis - Primary    Currently taking Adalimumab q 2 weeks with good response. Followed by [facility] Dermatology/Rheumatology-->last visit on 08/07/20. Recommendations at that time:    Psoriasis  - Patient has previously tried and failed topical steroids  - Patient is now pregnant (did have recent miscarriage prior to starting adalimumab). She was originally prescribed ustekinumab but this was denied  - Patient has been taking Adalimumab (stopped 1 dose due to new rash).  Discussed that from dermatologic perspective, we are agreeable to continuing this medication during pregnancy but defer to OBGYN. Patient plans to establish care with high risk OBGYN for her pre natal care  - recommend start clobetasol 0.05% ointment bid to affected area until flat and smooth    Tumor necrosis factor (TNF)-alpha blockers may be used in women who require these medications for the maintenance or establishment of control of active inflammatory disease during pregnancy. Some expert guidance suggests these medications should be discontinued in the late second or early third trimester. However, use of these drugs can be extended, if necessary, to a later gestational age if benefits outweigh potential risks for an individual patient.     -08/07/20: Quant gold negative, HIV: non-reactive, Hep C ab: non-reactive, Hep B surf ab: non-reactive, Hep B core ab: non-reactive  -Pt is s/p 2nd dose of Moderna COVID-19 vaccination. Considering influenza vaccination at next visit (declined today).  -Will continue Adalimumab at this time  [ ]  Follow-up with dermatology on 11/13/20         Relevant Medications    ixekizumab (TALTZ AUTOINJECTOR) 80 mg/mL AtIn auto-injector    ixekizumab (TALTZ) 80 mg/mL AtIn auto-injector       Diabetes mellitus  Hypothyroidism  Hepatic steatosis  Psoriasis     Family History:   Sister with psoriasis and PsA    Past Medical History, Family History, Social History, Medication List, Allergies, and Problem List were reviewed in the rooming section of Epic.     ROS: Other than symptoms mentioned in the HPI, no fevers, chills, or other skin complaints    Physical Examination     GENERAL: Well-appearing female in no acute distress, resting comfortably.  NEURO: Alert and oriented, answers questions appropriately  PSYCH: Normal mood and affect  SKIN (Focal Skin Exam): Per patient request, examination of forearms, elbows, face, hands, lower legs was performed  - some skin colored papules and plaques with white scaly on bilateral elbows and knees    All areas not commented on are within normal limits or unremarkable    (Approved Template 07/09/2020)

## 2023-12-21 ENCOUNTER — Ambulatory Visit: Admit: 2023-12-21 | Discharge: 2023-12-22 | Payer: PRIVATE HEALTH INSURANCE

## 2023-12-21 DIAGNOSIS — Z79899 Other long term (current) drug therapy: Principal | ICD-10-CM

## 2023-12-21 DIAGNOSIS — L409 Psoriasis, unspecified: Principal | ICD-10-CM

## 2023-12-21 MED ORDER — IXEKIZUMAB 80 MG/ML SUBCUTANEOUS AUTO-INJECTOR
9 refills | 0.00 days | Status: CP
Start: 2023-12-21 — End: ?

## 2023-12-21 MED ORDER — TALTZ AUTOINJECTOR 80 MG/ML SUBCUTANEOUS
0 refills | 0.00 days | Status: CP
Start: 2023-12-21 — End: ?
  Filled 2024-02-04: qty 3, 28d supply, fill #0

## 2023-12-21 NOTE — Unmapped (Signed)
 Your intake nurse is Bruce    Please remember to fill out the survey you will receive after your visit. Your comments help [place] continue to improve our care.      Thanks in advance!      [facility] Dermatology Clinical Staff

## 2023-12-22 DIAGNOSIS — L409 Psoriasis, unspecified: Principal | ICD-10-CM

## 2023-12-22 NOTE — Unmapped (Addendum)
 I reviewed this patient case and all documentation provided by the learner and was readily available for consultation during their interaction with the patient.  I agree with the assessment and plan listed below.    Meghan A [name] Lucy [facility] Delivery Pharm[facility] Specialty Pharmacist        [facility] Specialty and Home Delivery Pharmacy Clinical Intervention    Type of intervention: Prescription clarification    Medication involved: Humira and Taltz    Problem identified: Patient had a question if she is supposed to continue Humira or stop and wait until she can start Taltz.    Intervention performed: I informed her that Altamease Oiler requires a PA. While we wait for that to be approved, she is to continue taking Humira and that we will reach out to her (See Progress Note 12/21/23.) I verified that Humira will be delivered on 2/27.     Follow-up needed: None    Approximate time spent: 0-5 minutes    Clinical evidence used to support intervention: Chart review    Result of the intervention: Improved medication adherence    Norva Karvonen   [facility] Specialty and Home Delivery Pharmacy Specialty Pharmacist

## 2023-12-22 NOTE — Unmapped (Signed)
 [facility] Specialty and Home Delivery Pharmacy Refill Coordination Note    Stephanie Hester, DOB: 27-May-1981  Phone: [phone] (home)       All above HIPAA information was verified with patient.         12/21/2023    10:56 AM   Specialty Rx Medication Refill Questionnaire   Which Medications would you like refilled and shipped? Humira   Please list all current allergies: None   Have you missed any doses in the last 30 days? No   Have you had any changes to your medication(s) since your last refill? No   How many days remaining of each medication do you have at home? 0   If receiving an injectable medication, next injection date is 12/25/2023   Have you experienced any side effects in the last 30 days? No   Please enter the full address (street address, city, state, zip code) where you would like your medication(s) to be delivered to. [address] [place] [address]   Please specify on which day you would like your medication(s) to arrive. Note: if you need your medication(s) within 3 days, please call the pharmacy to schedule your order at [phone]  12/24/2023   Has your insurance changed since your last refill? No   Would you like a pharmacist to call you to discuss your medication(s)? Yes   Please enter the preferred phone number where you can be reached. [phone]   Do you require a signature for your package? (Note: if we are billing Medicare Part B or your order contains a controlled substance, we will require a signature) No         Completed refill call assessment today to schedule patient's medication shipment from the [facility] Specialty and Home Delivery Pharmacy [phone]).  All relevant notes have been reviewed.       Confirmed patient received a Conservation officer, historic buildings and a Surveyor, mining with first shipment. The patient will receive a drug information handout for each medication shipped and additional FDA Medication Guides as required.         REFERRAL TO PHARMACIST     Referral to the pharmacist: Yes - patient request      SHIPPING     Shipping address confirmed in Epic.     Delivery Scheduled: Yes, Expected medication delivery date: 12/24/2023.     Medication will be delivered via Same Day Courier to the prescription address in Epic WAM.    Delaine Lame   [facility] Specialty and Home Delivery Pharmacy Specialty Technician

## 2023-12-24 MED FILL — HUMIRA PEN CITRATE FREE 40 MG/0.4 ML: SUBCUTANEOUS | 28 days supply | Qty: 2 | Fill #6

## 2023-12-25 NOTE — Unmapped (Signed)
 It was identified that this patient required additional assistance in obtaining  Taltz , This pharmacist  spoke with patient to review she'll need to call Medicaid to remove primary insurance (she reports no longer has this insurance) and to call the pharmacy once this has happened so we can re-submit the PA for Taltz. I reviewed the PA had been kicked back for this reason .      Approximate time spent: 5-10 minutes    [name], Clinical Specialty Pharmacist  [facility] Specialty and Home Delivery Pharmacy

## 2024-01-04 NOTE — Unmapped (Signed)
 Stephanie Hester called in to the pharmacy to inform Stephanie Hester her insurance had been corrected to have medicaid primary. I informed her we could now submit for PA and would be back in touch once we learned of insurance's decision.    Stephanie Holderman A. Katrinka Blazing, PharmD, BCPS - Clinical Pharmacist   [facility] Specialty and Home Delivery Pharmacy    [address] [place], [place] [address]  t [phone], opt 4 then 2 - f [phone]

## 2024-01-22 MED FILL — HUMIRA PEN CITRATE FREE 40 MG/0.4 ML: SUBCUTANEOUS | 28 days supply | Qty: 2 | Fill #7

## 2024-01-22 NOTE — Unmapped (Signed)
 [facility] Specialty and Home Delivery Pharmacy Refill Coordination Note    Specialty Medication(s) to be Shipped:   Inflammatory Disorders: Humira    Other medication(s) to be shipped: No additional medications requested for fill at this time     Stephanie Hester, DOB: [birth year]  Phone: There are no phone numbers on file.      All above HIPAA information was verified with patient.     Was a Nurse, learning disability used for this call? Yes, spanish. Patient language is appropriate in [facility]    Completed refill call assessment today to schedule patient's medication shipment from the [facility] Specialty and Home Delivery Pharmacy  [phone]).  All relevant notes have been reviewed.     Specialty medication(s) and dose(s) confirmed: Regimen is correct and unchanged.   Changes to medications: Stephanie Hester reports no changes at this time.  Changes to insurance: No  New side effects reported not previously addressed with a pharmacist or physician: None reported  Questions for the pharmacist: No    Confirmed patient received a Conservation officer, historic buildings and a Surveyor, mining with first shipment. The patient will receive a drug information handout for each medication shipped and additional FDA Medication Guides as required.       DISEASE/MEDICATION-SPECIFIC INFORMATION        For patients on injectable medications: Patient currently has 0 doses left.  Next injection is scheduled for 01/22/2024.    SPECIALTY MEDICATION ADHERENCE     Medication Adherence    Patient reported X missed doses in the last month: 0  Specialty Medication: adalimumab: HUMIRA(CF) PEN 40 mg/0.4 mL injection  Patient is on additional specialty medications: No              Were doses missed due to medication being on hold? No    Humira 40/0.4 mg/ml: 0 doses of medicine on hand        REFERRAL TO PHARMACIST     Referral to the pharmacist: Not needed      [facility]     Shipping address confirmed in Epic.     Cost and Payment: Patient has a copay of $4.00. They are aware and have authorized the pharmacy to charge the credit card on file.    Delivery Scheduled: Yes, Expected medication delivery date: 01/22/2024.     Medication will be delivered via Same Day Courier to the prescription address in Epic WAM.    Quintella Reichert   [facility] Specialty and Home Delivery Pharmacy  Specialty Technician

## 2024-01-31 ENCOUNTER — Emergency Department: Admit: 2024-01-31 | Discharge: 2024-01-31 | Disposition: A | Discharge status: Home / Self Care

## 2024-01-31 ENCOUNTER — Emergency Department
Admit: 2024-01-31 | Discharge: 2024-01-31 | Disposition: A | Discharge status: Home / Self Care | Payer: Medicaid (Managed Care)

## 2024-01-31 DIAGNOSIS — R051 Acute cough: Principal | ICD-10-CM

## 2024-01-31 MED ORDER — CODEINE 10 MG-GUAIFENESIN 100 MG/5 ML ORAL LIQUID
Freq: Three times a day (TID) | ORAL | 0 refills | 8.00 days | Status: CP | PRN
Start: 2024-01-31 — End: 2024-02-05

## 2024-01-31 NOTE — Unmapped (Signed)
 Patient reporting cough, mucus production, and CP x1 wk.
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
## 2024-01-31 NOTE — Unmapped (Signed)
 Pt arrives c/o cough x 1 week but progressively getting worse. Pt reports she has had a lot of yellow phlegm. Pt also endorsing some CP w coughing. Pt states she is also having some throat pain. Pt was seen on Wednesday and given singulair but has had no relief. Pt also seen on Friday and was prescribed amoxicillin  but has had no relief.

## 2024-02-03 MED ORDER — EMPTY CONTAINER
2 refills | 0.00 days
Start: 2024-02-03 — End: ?

## 2024-02-03 NOTE — Unmapped (Signed)
 [facility] Specialty Medication Onboarding    Specialty Medication: Runner, broadcasting/film/video  Prior Authorization: Approved   Financial Assistance: No - copay  <$25  Final Copay/Day Supply: $4 / 28 days (LD & MD)    Insurance Restrictions: Yes - max 1 month supply     Notes to Pharmacist:   Credit Card on File: yes  Start Date on Rx:      The triage team has completed the benefits investigation and has determined that the patient is able to fill this medication at [facility]. Please contact the patient to complete the onboarding or follow up with the prescribing physician as needed.

## 2024-02-03 NOTE — Unmapped (Signed)
  Specialty and Home Delivery Pharmacy    Patient Onboarding/Medication Counseling    [name] is a 43 y.o. female with psoriasis who I am counseling today on initiation of therapy.  I am speaking to the patient.    Was a Nurse, learning disability used for this call? Yes, Spanish. Patient language is appropriate in [facility]    Verified patient's date of birth / HIPAA.    Specialty medication(s) to be sent: Inflammatory Disorders: Taltz      Non-specialty medications/supplies to be sent: sharps kit, clobetasol scalp solution      Medications not needed at this time: na         Taltz (ixekizumab)    Medication & Administration     Dosage: Plaque psoriasis: Inject 160mg  under the skin at week 0, 80mg  at weeks 2, 4, 6, 8, 10, and 12, then 80mg  every 4 weeks    Lab tests required prior to treatment initiation:  Tuberculosis: Tuberculosis screening resulted in a non-reactive Quantiferon TB Gold assay.    Administration:     Systems developer all supplies needed for injection on a clean, flat working surface: medication pen removed from packaging, alcohol swab, sharps container, etc.  Look at the medication label - look for correct medication, correct dose, and check the expiration date  Look at the medication - the liquid visible in the window on the side of the pen device should appear clear and colorless to slight yellow  Lay the auto-injector pen on a flat surface and allow it to warm up to room temperature for at least 30 minutes  Select injection site - you can use the front of your thigh or your belly (but not the area 2 inches around your belly button); if someone else is giving you the injection you can also use your upper arm in the skin covering your triceps muscle  Prepare injection site - wash your hands and clean the skin at the injection site with an alcohol swab and let it air dry, do not touch the injection site again before the injection  Twist off the base safety cap in the direction of the arrows, do not remove until immediately prior to injection and do not touch the white needle cover  Place the clear base flat and firmly against your skin at the injection site at a 90 degree angle, hold the pen such that you can see the clear medication window  While holding the pen in place against the injection site, turn the lock ring to the unlock position; the pen is now ready to inject  Press the green injection button to initiate the injection, there will be a click when the injection starts  Continue to hold the pen firmly against your skin for about 10-15 seconds - you will see the gray plunger moving during the process; after 10-15 seconds you will hear a second click to let you know the injection is complete  To verify the injection is complete look and ensure the gray plunger is all the way at the bottom of the clear base; once completion is verified pull the pen away from your skin  Dispose of the used auto-injector pen immediately in your sharps disposal container the needle will be covered automatically  If you see any blood at the injection site, press a cotton ball or gauze on the site and maintain pressure until the bleeding stops, do not rub the injection site    Adherence/Missed dose instructions:  If your injection is given more than 7 days after your scheduled injection date - consult your pharmacist for additional instructions on how to adjust your dosing schedule.    Goals of Therapy     Ankylosing spondylitis  Relief of symptoms  Maintenance of function  Prevention of complications of spinal disease  Minimization of extraspinal and extraarticular manifestations and comorbidities  Maintenance of effective psychosocial functioning    Plaque Psoriasis  Minimize areas of skin involvement (% BSA)  Avoidance of long term glucocorticoid use  Maintenance of effective psychosocial functioning    Psoriatic arthritis  Achieve remission/inactive disease or low/minimal disease activity  Maintenance of function  Minimization of systemic manifestations and comorbidities  Maintenance of effective psychosocial functioning      Side Effects & Monitoring Parameters     Injection site reaction (redness, irritation, inflammation localized to the site of administration)  Signs of a common cold - minor sore throat, runny or stuffy nose, etc.  Upset stomach    The following side effects should be reported to the provider:  Signs of a hypersensitivity reaction - rash; hives; itching; red, swollen, blistered, or peeling skin; wheezing; tightness in the chest or throat; difficulty breathing, swallowing, or talking; swelling of the mouth, face, lips, tongue, or throat; etc.  Reduced immune function - report signs of infection such as fever; chills; body aches; very bad sore throat; ear or sinus pain; cough; more sputum or change in color of sputum; pain with passing urine; wound that will not heal, etc.  Also at a slightly higher risk of some malignancies (mainly skin and blood cancers) due to this reduced immune function.  In the case of signs of infection - the patient should hold the next dose of Taltz?? and call your primary care provider to ensure adequate medical care.  Treatment may be resumed when infection is treated and patient is asymptomatic.  Stomach pain or diarrhea  Redness or white patches in the mouth or throat      Contraindications, Warnings, & Precautions     Have your bloodwork checked as you have been told by your prescriber  New or worsened inflammatory bowel disease has happened with this drug  Talk with your doctor if you are pregnant, planning to become pregnant, or breastfeeding  Discuss the possible need for holding your dose(s) of Taltz when a planned procedure is scheduled with the prescriber as it may delay healing/recovery timeline       Drug/Food Interactions     Medication list reviewed in Epic. The patient was instructed to inform the care team before taking any new medications or supplements. No drug interactions identified.   Talk with you prescriber or pharmacist before receiving any live vaccinations while taking this medication and after you stop taking it    Storage, Handling Precautions, & Disposal     Store this medication in the refrigerator.  Do not freeze  If needed, you may store at room temperature for up to 5 days  Store in original packaging, protected from light  Do not shake  Dispose of used syringes/pens in a sharps disposal container        Current Medications (including OTC/herbals), Comorbidities and Allergies     Current Outpatient Medications   Medication Sig Dispense Refill    atorvastatin (LIPITOR) 20 MG tablet ATORVASTATIN CALCIUM 20 MG TABS      blood-glucose meter (FREESTYLE INSULINX) Misc Test fasting and one hour after each meal.  Any brand ok. 1  each 0    ciclopirox (LOPROX) 0.77 % cream Apply topically two (2) times a day. Gently massage into affected areas and surrounding skin. Apply to left foot and in between the toes. 30 g 6    clindamycin (CLEOCIN T) 1 % external solution Apply topically up to two (2) times a day. Apply to the red irritated pus bumps on the scalp until they clear. Can restart if needed. 60 mL 4    clobetasol (TEMOVATE) 0.05 % external solution Apply topically daily to rash on scalp as needed for rash. 50 mL 5    clobetasoL (TEMOVATE) 0.05 % ointment Apply twice daily to red, raised rash for 2 weeks 60 g 2    codeine-guaiFENesin (GUAIFENESIN AC) 10-100 mg/5 mL liquid Take 5 mL by mouth Three (3) times a day as needed for cough for up to 5 days. 118 mL 0    empagliflozin (JARDIANCE) 25 mg tablet Take 1 tablet (25 mg total) by mouth daily.      empty container Misc Use as directed to dispose of used Humira pens. 1 each 2    empty container Misc Use as directed to dispose of Taltz pens. 1 each 2    fluticasone propionate (FLONASE) 50 mcg/actuation nasal spray FLUTICASONE PROPIONATE 50 MCG/ACT SUSP      glucagon (GLUCAGON) 1 mg SolR Glucagon emergency kit: use only as directed. 1 each 1    halobetasol (ULTRAVATE) 0.05 % ointment Apply topically Two (2) times a day onto rash on body for 2 weeks at a time as needed for rash. Stop after clearance of rash. 100 g 4    HUMIRA PEN CITRATE FREE 40 MG/0.4 ML Inject the contents of 1 pen (40 mg total) under the skin every fourteen (14) days. 2 each 10    ixekizumab (TALTZ AUTOINJECTOR) 80 mg/mL AtIn auto-injector Inject the contents of 2 pens (160 mg) under the skin  once, followed by the contents of 1 pen (80 mg)  at weeks 2, 4, 6, 8, 10 and 12, and then 80 mg every 4 weeks. 8 mL 0    ixekizumab (TALTZ) 80 mg/mL AtIn auto-injector Inject the contents of 1 pen (80 mg) under the skin every 4 weeks. 1 mL 9    levothyroxine (SYNTHROID) 125 MCG tablet Take 137 mcg by mouth daily.      levothyroxine (SYNTHROID) 175 MCG tablet Take 1 tablet (175 mcg total) by mouth daily.      metFORMIN (GLUMETZA) 500 MG (MOD) 24 hr tablet Take 1 tablet (500 mg total) by mouth two (2) times a day.      OZEMPIC 2 mg/dose (8 mg/3 mL) PnIj       peg-electrolyte soln (NULYTELY) 420 gram SolR Use as directed.      pioglitazone (ACTOS) 15 MG tablet Take 1 tablet (15 mg total) by mouth daily.      simethicone (MYLICON) 80 MG chewable tablet Chew 1 tablet (80 mg total).      VICTOZA 3-PAK 0.6 mg/0.1 mL (18 mg/3 mL) injection Inject 0.1 mL (0.6 mg total) under the skin daily.       No current facility-administered medications for this visit.       No Known Allergies    Patient Active Problem List   Diagnosis    Type 2 diabetes mellitus    Arcuate uterus    Hypothyroidism    BMI 37.0-37.9, adult    Psoriasis    Hepatic steatosis    Myalgia of pelvic  floor    Scar adherent    Abnormal uterine bleeding    Contraceptive education       Medication list has been reviewed and updated in Epic: Yes    Allergies have been reviewed and updated in Epic: Yes    Appropriateness of Therapy     Acute infections noted within Epic:  No active infections  Patient reported infection: None    Is the medication and dose appropriate based on diagnosis, medication list, comorbidities, allergies, medical history, patient???s ability to self-administer the medication, and therapeutic goals? Yes    Prescription has been clinically reviewed: Yes      Baseline Quality of Life Assessment      How many days over the past month did your psoriasis  keep you from your normal activities? For example, brushing your teeth or getting up in the morning. Patient declined to answer    Financial Information     Medication Assistance provided: Prior Authorization    Anticipated copay of $4 reviewed with patient. Verified delivery address.    Delivery Information     Scheduled delivery date: 4/10    Expected start date: 4/10      Medication will be delivered via Same Day Courier to the prescription address in [facility].  This shipment will not require a signature.      Explained the services we provide at [facility] Specialty and Home Delivery Pharmacy and that each month we would call to set up refills.  Stressed importance of returning phone calls so that we could ensure they receive their medications in time each month.  Informed patient that we should be setting up refills 7-10 days prior to when they will run out of medication.  A pharmacist will reach out to perform a clinical assessment periodically.  Informed patient that a welcome packet, containing information about our pharmacy and other support services, a Notice of Privacy Practices, and a drug information handout will be sent.      The patient or caregiver noted above participated in the development of this care plan and knows that they can request review of or adjustments to the care plan at any time.      Patient or caregiver verbalized understanding of the above information as well as how to contact the pharmacy at [phone] option 4 with any questions/concerns.  The pharmacy is open Monday through Friday 8:30am-4:30pm.  A pharmacist is available 24/7 via pager to answer any clinical questions they may have.    Patient Specific Needs     Does the patient have any physical, cognitive, or cultural barriers? No    Does the patient have adequate living arrangements? (i.e. the ability to store and take their medication appropriately) Yes    Did you identify any home environmental safety or security hazards? No    Patient prefers to have medications discussed with  Patient     Is the patient or caregiver able to read and understand education materials at a high school level or above? Yes    Patient's primary language is  English     Is the patient high risk? No    Does the patient have an additional or emergency contact listed in their chart? Yes    SOCIAL DETERMINANTS OF HEALTH     At the [facility], we have learned that life circumstances - like trouble affording food, housing, utilities, or transportation can affect the health of many of our patients.   That is  why we wanted to ask: are you currently experiencing any life circumstances that are negatively impacting your health and/or quality of life? Patient declined to answer    Social Drivers of Health     Food Insecurity: No Food Insecurity (03/11/2018)    Hunger Vital Sign     Worried About Running Out of Food in the Last Year: Never true     Ran Out of Food in the Last Year: Never true   Tobacco Use: Low Risk  (01/31/2024)    Patient History     Smoking Tobacco Use: Never     Smokeless Tobacco Use: Never     Passive Exposure: Not on file   Transportation Needs: No Transportation Needs (03/11/2018)    PRAPARE - Transportation     Lack of Transportation (Medical): No     Lack of Transportation (Non-Medical): No   Alcohol Use: Not on file   Housing: Unknown (01/31/2021)    Housing     Within the past 12 months, have you ever stayed: outside, in a car, in a tent, in an overnight shelter, or temporarily in someone else's home (i.e. couch-surfing)?: No     Are you worried about losing your housing?: Not on file Physical Activity: Not on file   Utilities: Not on file   Stress: Not on file   Interpersonal Safety: Not on file   Substance Use: Not on file (08/31/2023)   Intimate Partner Violence: Unknown (03/11/2018)    Humiliation, Afraid, Rape, and Kick questionnaire     Fear of Current or Ex-Partner: Not asked     Emotionally Abused: Not asked     Physically Abused: Not asked     Sexually Abused: Not asked   Social Connections: Not on file   Financial Resource Strain: Not on file   Depression: Not on file   Internet Connectivity: Not on file   Health Literacy: Medium Risk (01/31/2021)    Health Literacy     : Rarely       Would you be willing to receive help with any of the needs that you have identified today? Not applicable       Lenox Bink [name] Specialty Pharmacist

## 2024-02-04 MED FILL — CLOBETASOL 0.05 % SCALP SOLUTION: TOPICAL | 20 days supply | Qty: 50 | Fill #2

## 2024-02-04 MED FILL — EMPTY CONTAINER: 120 days supply | Qty: 1 | Fill #0

## 2024-02-23 NOTE — Unmapped (Signed)
 Catlyn reports her Taltz  seems to be working well - she's seen some improvements in her psoriasis already.      Her loading doses will continue:  5/9, 5/23, 6/6, 6/20, 7/4 will finish out loading doses before moving to monthly maintenance .      [facility] Specialty and Home Delivery Pharm[facility] Clinical Assessment & Refill Coordination Note    Stephanie Hester, DOB: 1981-07-22  Phone: There are no phone numbers on file.    All above HIPAA information was verified with patient.     Was a Nurse, learning disability used for this call? No    Specialty Medication(s):   Inflammatory Disorders: Taltz      Current Outpatient Medications   Medication Sig Dispense Refill    atorvastatin (LIPITOR) 20 MG tablet ATORVASTATIN CALCIUM  20 MG TABS      blood-glucose meter (FREESTYLE INSULINX) Misc Test fasting and one hour after each meal.  Any brand ok. 1 each 0    ciclopirox  (LOPROX ) 0.77 % cream Apply topically two (2) times a day. Gently massage into affected areas and surrounding skin. Apply to left foot and in between the toes. 30 g 6    clindamycin  (CLEOCIN  T) 1 % external solution Apply topically up to two (2) times a day. Apply to the red irritated pus bumps on the scalp until they clear. Can restart if needed. 60 mL 4    clobetasol  (TEMOVATE ) 0.05 % external solution Apply topically daily to rash on scalp as needed for rash. 50 mL 5    clobetasoL  (TEMOVATE ) 0.05 % ointment Apply twice daily to red, raised rash for 2 weeks 60 g 2    empagliflozin  (JARDIANCE ) 25 mg tablet Take 1 tablet (25 mg total) by mouth daily.      empty container Misc Use as directed to dispose of used Humira  pens. 1 each 2    empty container Misc Use as directed to dispose of Taltz  pens. 1 each 2    fluticasone propionate (FLONASE) 50 mcg/actuation nasal spray FLUTICASONE PROPIONATE 50 MCG/ACT SUSP      glucagon  (GLUCAGON ) 1 mg SolR Glucagon  emergency kit: use only as directed. 1 each 1    halobetasol  (ULTRAVATE ) 0.05 % ointment Apply topically Two (2) times a day onto rash on body for 2 weeks at a time as needed for rash. Stop after clearance of rash. 100 g 4    ixekizumab  (TALTZ  AUTOINJECTOR) 80 mg/mL AtIn auto-injector Inject the contents of 2 pens (160 mg) under the skin  once, followed by the contents of 1 pen (80 mg)  at weeks 2, 4, 6, 8, 10 and 12, and then 80 mg every 4 weeks. 8 mL 0    ixekizumab  (TALTZ ) 80 mg/mL AtIn auto-injector Inject the contents of 1 pen (80 mg) under the skin every 4 weeks. 1 mL 9    levothyroxine  (SYNTHROID ) 125 MCG tablet Take 137 mcg by mouth daily.      levothyroxine  (SYNTHROID ) 175 MCG tablet Take 1 tablet (175 mcg total) by mouth daily.      metFORMIN  (GLUMETZA ) 500 MG (MOD) 24 hr tablet Take 1 tablet (500 mg total) by mouth two (2) times a day.      OZEMPIC 2 mg/dose (8 mg/3 mL) PnIj       peg-electrolyte soln (NULYTELY) 420 gram SolR Use as directed.      pioglitazone (ACTOS) 15 MG tablet Take 1 tablet (15 mg total) by mouth daily.      VICTOZA 3-PAK 0.6 mg/0.1 mL (  18 mg/3 mL) injection Inject 0.1 mL (0.6 mg total) under the skin daily.       No current facility-administered medications for this visit.        Changes to medications: Jilliam reports no changes at this time.    Medication list has been reviewed and updated in Epic: Yes    No Known Allergies    Changes to allergies: No    Allergies have been reviewed and updated in Epic: Yes    SPECIALTY MEDICATION ADHERENCE     Taltz  - 0 left  Medication Adherence    Patient reported X missed doses in the last month: 0  Specialty Medication: Taltz           Specialty medication(s) dose(s) confirmed: Regimen is correct and unchanged.     Are there any concerns with adherence? No    Adherence counseling provided? Not needed    CLINICAL MANAGEMENT AND INTERVENTION      Clinical Benefit Assessment:    Do you feel the medicine is effective or helping your condition? Yes    Clinical Benefit counseling provided? Not needed    Adverse Effects Assessment:    Are you experiencing any side effects? Yes, patient reports experiencing slight weakness/fatigue after load. Did well after second dose. . See clinical intervention documentation: na/ patient will monitor    Are you experiencing difficulty administering your medicine? No    Quality of Life Assessment:    Quality of Life    Rheumatology  Oncology  Dermatology  1. What impact has your specialty medication had on the symptoms of your skin condition (i.e. itchiness, soreness, stinging)?: Some  2. What impact has your specialty medication had on your comfort level with your skin?: Some  Cystic Fibrosis          How many days over the past month did your psoriasis  keep you from your normal activities? For example, brushing your teeth or getting up in the morning. Patient declined to answer    Have you discussed this with your provider? Not needed    Acute Infection Status:    Acute infections noted within Epic:  No active infections    Patient reported infection: None    Therapy Appropriateness:    Is therapy appropriate based on current medication list, adverse reactions, adherence, clinical benefit and progress toward achieving therapeutic goals? Yes, therapy is appropriate and should be continued     Clinical Intervention:    Was an intervention completed as part of this clinical assessment? No    DISEASE/MEDICATION-SPECIFIC INFORMATION      For patients on injectable medications: Patient currently has 0 doses left.  Next injection is scheduled for 5/9, 5/23, 6/6, 6/20, 7/4 will finish out loading doses before moving to monthly maintenance .    Chronic Inflammatory Diseases: Have you experienced any flares in the last month? No  Has this been reported to your provider? No    PATIENT SPECIFIC NEEDS     Does the patient have any physical, cognitive, or cultural barriers? No    Is the patient high risk? No    Does the patient require physician intervention or other additional services (i.e., nutrition, smoking cessation, social work)? No    Does the patient have an additional or emergency contact listed in their chart? Yes    SOCIAL DETERMINANTS OF HEALTH     At the [facility], we have learned that life circumstances - like trouble affording food, housing, utilities, or  transportation can affect the health of many of our patients.   That is why we wanted to ask: are you currently experiencing any life circumstances that are negatively impacting your health and/or quality of life? Patient declined to answer    Social Drivers of Health     Food Insecurity: No Food Insecurity (03/11/2018)    Hunger Vital Sign     Worried About Running Out of Food in the Last Year: Never true     Ran Out of Food in the Last Year: Never true   Tobacco Use: Low Risk  (01/31/2024)    Patient History     Smoking Tobacco Use: Never     Smokeless Tobacco Use: Never     Passive Exposure: Not on file   Transportation Needs: No Transportation Needs (03/11/2018)    PRAPARE - Transportation     Lack of Transportation (Medical): No     Lack of Transportation (Non-Medical): No   Alcohol Use: Not on file   Housing: Unknown (01/31/2021)    Housing     Within the past 12 months, have you ever stayed: outside, in a car, in a tent, in an overnight shelter, or temporarily in someone else's home (i.e. couch-surfing)?: No     Are you worried about losing your housing?: Not on file   Physical Activity: Not on file   Utilities: Not on file   Stress: Not on file   Interpersonal Safety: Not on file   Substance Use: Not on file (08/31/2023)   Intimate Partner Violence: Unknown (03/11/2018)    Humiliation, Afraid, Rape, and Kick questionnaire     Fear of Current or Ex-Partner: Not asked     Emotionally Abused: Not asked     Physically Abused: Not asked     Sexually Abused: Not asked   Social Connections: Not on file   Financial Resource Strain: Not on file   Health Literacy: Medium Risk (01/31/2021)    Health Literacy     : Rarely   Internet Connectivity: Not on file       Would you be willing to receive help with any of the needs that you have identified today? Not applicable       SHIPPING     Specialty Medication(s) to be Shipped:   Inflammatory Disorders: Taltz     Other medication(s) to be shipped:  clobetasol  scalp solution     Changes to insurance: No    Cost and Payment: Patient has a copay of $8. They are aware and have authorized the pharmacy to charge the credit card on file.    Delivery Scheduled: Yes, Expected medication delivery date: 4/30.     Medication will be delivered via Same Day Courier to the confirmed prescription address in [facility].    The patient will receive a drug information handout for each medication shipped and additional FDA Medication Guides as required.  Verified that patient has previously received a Conservation officer, historic buildings and a Surveyor, mining.    The patient or caregiver noted above participated in the development of this care plan and knows that they can request review of or adjustments to the care plan at any time.      All of the patient's questions and concerns have been addressed.    Kyian [facility]st [facility] Specialty Pharmacist

## 2024-02-24 MED FILL — CLOBETASOL 0.05 % SCALP SOLUTION: TOPICAL | 20 days supply | Qty: 50 | Fill #3

## 2024-02-24 MED FILL — TALTZ AUTOINJECTOR 80 MG/ML SUBCUTANEOUS: SUBCUTANEOUS | 28 days supply | Qty: 3 | Fill #1

## 2024-02-29 ENCOUNTER — Ambulatory Visit: Admit: 2024-02-29 | Discharge: 2024-03-01 | Payer: Medicaid (Managed Care)

## 2024-02-29 DIAGNOSIS — L409 Psoriasis, unspecified: Principal | ICD-10-CM

## 2024-02-29 DIAGNOSIS — B353 Tinea pedis: Principal | ICD-10-CM

## 2024-02-29 DIAGNOSIS — Z79899 Other long term (current) drug therapy: Principal | ICD-10-CM

## 2024-02-29 MED ORDER — KETOCONAZOLE 2 % TOPICAL CREAM
Freq: Every day | TOPICAL | 5 refills | 60.00000 days | Status: CP
Start: 2024-02-29 — End: ?
  Filled 2024-03-01: qty 30, 30d supply, fill #0

## 2024-02-29 NOTE — Unmapped (Signed)
 Meet your team:     Your intake nurse is: Demar Shad    Please remember to fill out the survey you will receive after your visit. Your comments help us  continue to improve our care.      Thanks in advance!      [facility] Dermatology Clinical Staff

## 2024-02-29 NOTE — Unmapped (Signed)
 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 Made joint decision to continue current biologic.     Plan:   - Continue clobetasoL  (TEMOVATE ) 0.05 % external solution; Apply topically daily. To rash on scalp as needed for rash.  Not needing refills today.  - Continue halobetasol  (ULTRAVATE ) 0.05 % ointment; Apply topically Two (2) times a day. Onto rash on body for 2 weeks at a time as needed for rash. Stop after clearance of rash. Not needing refills today.  - Continue ixekizumab  (TALTZ  AUTOINJECTOR) 80 mg/mL AtIn auto-injector; SubQ: 160 mg once, followed by 80 mg at weeks 2, 4, 6, 8, 10 and 12, and then 80 mg every 4 weeks.  Dispense: 8 mL; Refill: 0  - Continue ixekizumab  (TALTZ ) 80 mg/mL AtIn auto-injector; Inject 80 mg under the skin every 4 weeks.  Dispense: 1 mL; Refill: 9     High risk medication use: Ixekizumab  (Taltz )  - Denied side effects or complications today.  - Quant gold from 07/21/22 negative.  - Repeat quant gold 05/2023 negative     Risk of complications and/or morbidity and mortality associated with the management of plaque psoriasis is considered high due to:  Prescription drug management, Drug therapy requiring intensive monitoring for toxicity, and Medication induced immunosuppression     Tinea Pedis  Recurrent symptoms despite current treatment. Likely affected by systemic immunosuppression.  - Change topical antifungal cream to a different formulation.  - Stop ciclopirox  cream  - Start  ketoconazole (NIZORAL) 2 % cream; Apply 1 Application topically daily. Apply to affected skin on feet.    The patient was advised to call for an appointment should any new, changing, or symptomatic lesions develop.     RTC: Return in about 6 months (around 08/31/2024) for In-person. or sooner as needed   _________________________________________________________________      Chief Complaint     Chief Complaint   Patient presents with    Psoriasis     Pt coming in for a follow up evaluation on Psoriasis condition   No further area of concern        HPI     Stephanie Hester is a 43 y.o. female who presents as a returning patient (last seen 12/21/2023) to Dermatology for follow up of psoriasis.     History of Present Illness  Stephanie Hester is a 43 year old female with psoriasis and psoriatic arthritis who presents for follow-up on her new medication regimen.    She is in the loading dose phase of a new medication with injections every two weeks, having started on February 05, 2024. The initial dose caused mild anxiety, body tremors, and fatigue, which resolved after two days. Subsequent injections have not caused adverse effects. Her psoriasis shows significant improvement with the new medication.    She continues to experience a persistent fungal infection on her foot, characterized by itching and recurrent lesions, despite using a topical cream twice daily.      The patient denies any other new or changing lesions or areas of concern.     Pertinent Past Medical History     Problem List  Psoriasis    Currently taking Adalimumab  q 2 weeks with good response. Followed by [facility] Dermatology/Rheumatology-->last visit on 08/07/20. Recommendations at that time:    Psoriasis  - Patient has previously tried and failed topical steroids  - Patient is now pregnant (did have recent miscarriage prior to starting adalimumab ). She was originally prescribed ustekinumab  but this was denied  - Patient has been taking Adalimumab  (stopped 1 dose due to new rash). Discussed that from dermatologic perspective, we are agreeable to continuing this medication during pregnancy but defer to OBGYN. Patient plans to establish care with high risk OBGYN for her pre natal care  - recommend start clobetasol  0.05% ointment bid to affected area until flat and smooth    Tumor necrosis factor (TNF)-alpha blockers may be used in women who require these medications for the maintenance or establishment of control of active inflammatory disease during pregnancy. Some expert guidance suggests these medications should be discontinued in the late second or early third trimester. However, use of these drugs can be extended, if necessary, to a later gestational age if benefits outweigh potential risks for an individual patient.     -08/07/20: Quant gold negative, HIV: non-reactive, Hep C ab: non-reactive, Hep B surf ab: non-reactive, Hep B core ab: non-reactive  -Pt is s/p 2nd dose of Moderna COVID-19 vaccination. Considering influenza vaccination at next visit (declined today).  -Will continue Adalimumab  at this time  [ ]  Follow-up with dermatology on 11/13/20            Past Medical History, Family History, Social History, Medication List, Allergies, and Problem List were reviewed in the rooming section of Epic.     ROS: Other than symptoms mentioned in the HPI, no fevers, chills, or other skin complaints    Physical Examination     GENERAL: Well-appearing female in no acute distress, resting comfortably.  NEURO: Alert and oriented, answers questions appropriately  PSYCH: Normal mood and affect  SKIN (Focal Skin Exam): Per patient request, examination of arms, hands, legs, face  was performed    - some skin colored papules and plaques with white scaly on bilateral elbows and knee; less in number than during last visit  - freely moving fingers without limitations   - Scaling and mild erythema of the soles, some erythematous papules and pustules notable along lateral and medial foot, moccasin sign positive       All areas not commented on are within normal limits or unremarkable    (Approved Template 11/28/2023)

## 2024-03-17 NOTE — Unmapped (Signed)
 [facility] Specialty and Home Delivery Pharmacy Refill Coordination Note    Specialty Medication(s) to be Shipped:   Inflammatory Disorders: Taltz     Other medication(s) to be shipped: No additional medications requested for fill at this time     Stephanie Hester, DOB: [birth year]  Phone: There are no phone numbers on file.      All above HIPAA information was verified with patient.     Was a Nurse, learning disability used for this call? No    Completed refill call assessment today to schedule patient's medication shipment from the [facility] and Home Delivery Pharmacy  [phone]).  All relevant notes have been reviewed.     Specialty medication(s) and dose(s) confirmed: Regimen is correct and unchanged.   Changes to medications: Stephanie Hester reports no changes at this time.  Changes to insurance: No  New side effects reported not previously addressed with a pharmacist or physician: None reported  Questions for the pharmacist: No    Confirmed patient received a Conservation officer, historic buildings and a Surveyor, mining with first shipment. The patient will receive a drug information handout for each medication shipped and additional FDA Medication Guides as required.       DISEASE/MEDICATION-SPECIFIC INFORMATION        For patients on injectable medications: Patient currently has 2 doses left.  Next injection is scheduled for 5/23 & 6/6.    SPECIALTY MEDICATION ADHERENCE     Medication Adherence    Patient reported X missed doses in the last month: 0  Specialty Medication: ixekizumab  (TALTZ  AUTOINJECTOR) 80 mg/mL AtIn auto-injector loading  Patient is on additional specialty medications: No  Informant: patient              Were doses missed due to medication being on hold? No    ixekizumab  (TALTZ  AUTOINJECTOR) 80 mg/mL AtIn auto-injector: 2 doses of medicine on hand         REFERRAL TO PHARMACIST     Referral to the pharmacist: Not needed      SHIPPING     Shipping address confirmed in Epic.     Cost and Payment: Patient has a copay of $4. They are aware and have authorized the pharmacy to charge the credit card on file.    Delivery Scheduled: Yes, Expected medication delivery date: 6/6.     Medication will be delivered via Same Day Courier to the prescription address in Epic [place].    [facility]

## 2024-04-01 MED FILL — TALTZ AUTOINJECTOR 80 MG/ML SUBCUTANEOUS: SUBCUTANEOUS | 28 days supply | Qty: 2 | Fill #2

## 2024-04-04 ENCOUNTER — Ambulatory Visit: Admit: 2024-04-04 | Discharge: 2024-04-05 | Payer: Medicaid (Managed Care)

## 2024-04-04 DIAGNOSIS — B353 Tinea pedis: Principal | ICD-10-CM

## 2024-04-04 DIAGNOSIS — B354 Tinea corporis: Principal | ICD-10-CM

## 2024-04-04 MED ORDER — KETOCONAZOLE 2 % SHAMPOO
TOPICAL | 11 refills | 0.00000 days | Status: CP
Start: 2024-04-04 — End: ?

## 2024-04-04 MED ORDER — KETOCONAZOLE 2 % TOPICAL CREAM
Freq: Every day | TOPICAL | 5 refills | 60.00000 days | Status: CP
Start: 2024-04-04 — End: ?

## 2024-04-04 NOTE — Unmapped (Signed)
 Dermatology Note     Assessment and Plan:      Tinea Corporis  - KOH positive today  - Stop using clobetasol  on these areas, recommend avoidance of prednisone that she was given as this may flare her psoriasis and not help her current fungal rash  - Start ketoconazole  (NIZORAL ) 2 % cream; Apply 1 Application topically daily. Apply to affected skin   - Start ketoconazole  (NIZORAL ) 2 % shampoo; Apply to face, neck, back/body as body wash; allow to soak for 5-10 min, then rinse off; use daily when scale is present, then weekly to prevent recurrence        The patient was advised to call for an appointment should any new, changing, or symptomatic lesions develop.     RTC: Return if symptoms worsen or fail to improve. or sooner as needed   _________________________________________________________________      Chief Complaint     Chief Complaint   Patient presents with    Rash     Pt began to experience discoloration/rash like symptoms around right abdomen area and right shoulder. Present since May 30th.       HPI     Stephanie Hester is a 43 y.o. female who presents as a returning patient (last seen 02/29/2024) to Dermatology for a rash. At last visit, patient continued on taltz  and clobetasol .     - Rash is on abdomen and upper R arm  - Itchy  - Scaly  - Clobetasol  has not helped  - No other areas of involvement   - Was given prednisone by PCP    The patient denies any other new or changing lesions or areas of concern.     Pertinent Past Medical History     Past Medical History, Family History, Social History, Medication List, Allergies, and Problem List were reviewed in the rooming section of Epic.     ROS: Other than symptoms mentioned in the HPI, no fevers, chills, or other skin complaints    Physical Examination     GENERAL: Well-appearing female in no acute distress, resting comfortably.  NEURO: Alert and oriented, answers questions appropriately  PSYCH: Normal mood and affect  RESP: No increased work of breathing  SKIN (Focal Skin Exam): Per patient request, examination of abdomen and arms was performed  - Scaly annular erythematous plaques on abdomen and R upper arm    All areas not commented on are within normal limits or unremarkable      (Approved Template 07/09/2020)

## 2024-04-04 NOTE — Unmapped (Signed)
 Meet your team:     Your intake nurse is: Cicero Duck    Please remember to fill out the survey you will receive after your visit. Your comments help [place] continue to improve our care.      Thanks in advance!      [facility] Dermatology Clinical Staff

## 2024-04-13 DIAGNOSIS — L409 Psoriasis, unspecified: Principal | ICD-10-CM

## 2024-04-13 DIAGNOSIS — L239 Allergic contact dermatitis, unspecified cause: Principal | ICD-10-CM

## 2024-04-13 MED ORDER — CLOBETASOL 0.05 % TOPICAL OINTMENT
TOPICAL | 2 refills | 0.00000 days | Status: CP
Start: 2024-04-13 — End: ?

## 2024-04-13 NOTE — Unmapped (Addendum)
 Hold on getting allergy shots until the rash is resolved and then you can resume   Start clobetasol  twice daily to the raised itchy areas until smooth and then stop.      [facility] releases most results to you as soon as they are available. Therefore, you may see some results before we do. Please give us  3 business days to review the tests and contact you by phone or through MyChart. If you are concerned that some results may be upsetting or confusing, you may wish to wait until we contact you before looking at the report in MyChart.   If you have an urgent question, you can call our clinic. MyChart should not be used for urgent issues. Otherwise, we prefer that you wait 3 business days for us  to contact you.    [facility] Dermatology Clinical Team

## 2024-04-13 NOTE — Unmapped (Signed)
 I saw and evaluated the patient, participating in the key elements of the service. I discussed the findings, assessment and plan with the resident and agree with resident???s findings and plan as documented in the resident???s note. I was present for the entirety of the procedure(s).

## 2024-04-13 NOTE — Unmapped (Signed)
 Dermatology Note     Assessment and Plan:      Tinea Corporis vs poison ivy   - KOH positive previously, patient reports minimal improvement with ketoconazole  and development of new lesions   - START using clobetasol  on these areas, recommend continued avoidance of prednisone that she was given as this may flare her psoriasis   - CONTINUE ketoconazole  (NIZORAL ) 2 % cream; Apply 1 Application topically daily. Apply to affected skin   - CONTINUE ketoconazole  (NIZORAL ) 2 % shampoo; Apply to face, neck, back/body as body wash; allow to soak for 5-10 min, then rinse off; use daily when scale is present, then weekly to prevent recurrence  - stop allergy shots for about 2-3 weeks while awaiting rash to resolve  - consider biopsy at future visit if still not improving      The patient was advised to call for an appointment should any new, changing, or symptomatic lesions develop.     RTC: Return in about 4 weeks (around 05/11/2024). or sooner as needed   _________________________________________________________________      Chief Complaint     Chief Complaint   Patient presents with    Rash     Pt here for rash, abd, back and chest, occurring for about 3wks oor more         HPI     Stephanie Hester is a 43 y.o. female who presents as a returning patient (last seen 04/04/2024) to Dermatology for a rash. She reports rash is worsening and in new areas. It is itchy and burning and in streaks. She does not garden but does some work outside. She does not think ketoconazole  has helped.     The patient denies any other new or changing lesions or areas of concern.     Pertinent Past Medical History         Problem List          Musculoskeletal and Integument    Psoriasis    Currently taking Adalimumab  q 2 weeks with good response. Followed by [facility] Dermatology/Rheumatology-->last visit on 08/07/20. Recommendations at that time:    Psoriasis  - Patient has previously tried and failed topical steroids  - Patient is now pregnant (did have recent miscarriage prior to starting adalimumab ). She was originally prescribed ustekinumab  but this was denied  - Patient has been taking Adalimumab  (stopped 1 dose due to new rash). Discussed that from dermatologic perspective, we are agreeable to continuing this medication during pregnancy but defer to OBGYN. Patient plans to establish care with high risk OBGYN for her pre natal care  - recommend start clobetasol  0.05% ointment bid to affected area until flat and smooth    Tumor necrosis factor (TNF)-alpha blockers may be used in women who require these medications for the maintenance or establishment of control of active inflammatory disease during pregnancy. Some expert guidance suggests these medications should be discontinued in the late second or early third trimester. However, use of these drugs can be extended, if necessary, to a later gestational age if benefits outweigh potential risks for an individual patient.     -08/07/20: Quant gold negative, HIV: non-reactive, Hep C ab: non-reactive, Hep B surf ab: non-reactive, Hep B core ab: non-reactive  -Pt is s/p 2nd dose of Moderna COVID-19 vaccination. Considering influenza vaccination at next visit (declined today).  -Will continue Adalimumab  at this time  [ ]  Follow-up with dermatology on 11/13/20         Relevant Medications  clobetasol  (TEMOVATE ) 0.05 % ointment         Family History:       Past Medical History, Family History, Social History, Medication List, Allergies, and Problem List were reviewed in the rooming section of Epic.     ROS: Other than symptoms mentioned in the HPI, no fevers, chills, or other skin complaints    Physical Examination     GENERAL: Well-appearing female in no acute distress, resting comfortably.  NEURO: Alert and oriented, answers questions appropriately  SKIN (Focal Skin Exam): Per patient request, examination of chest, abdomen, back, trunk was performed    - linear erythematous nonscaly plaques and papules on trunk    All areas not commented on are within normal limits or unremarkable      (Approved Template 07/09/2020)

## 2024-04-14 NOTE — Unmapped (Signed)
 The [facility] has made a second and final attempt to reach this patient to refill the following medication:Taltz .      We have left voicemails on the following phone numbers: [phone], have sent a MyChart message, have sent a text message to the following phone numbers: [phone], and have sent a Mychart questionnaire..    Dates contacted: 04/07/2024  04/14/2024  Last scheduled delivery: 04/01/2024    The patient may be at risk of non-compliance with this medication. The patient should call the [facility] at [phone]  Option 4, then Option 2: Dermatology, Gastroenterology, Rheumatology to refill medication.    Dempsey Finely

## 2024-04-14 NOTE — Unmapped (Signed)
 [facility] Specialty and Home Delivery Pharmacy Refill Coordination Note    Specialty Medication(s) to be Shipped:   Inflammatory Disorders: Taltz     Other medication(s) to be shipped: No additional medications requested for fill at this time     Stephanie Hester, DOB: 1981/04/24  Phone: There are no phone numbers on file.      All above HIPAA information was verified with patient.     Was a Nurse, learning disability used for this call? No    Completed refill call assessment today to schedule patient's medication shipment from the [facility] and Home Delivery Pharmacy  [phone]).  All relevant notes have been reviewed.     Specialty medication(s) and dose(s) confirmed: Regimen is correct and unchanged.   Changes to medications: [name] reports no changes at this time.  Changes to insurance: No  New side effects reported not previously addressed with a pharmacist or physician: None reported  Questions for the pharmacist: No    Confirmed patient received a Conservation officer, historic buildings and a Surveyor, mining with first shipment. The patient will receive a drug information handout for each medication shipped and additional FDA Medication Guides as required.       DISEASE/MEDICATION-SPECIFIC INFORMATION        For patients on injectable medications: Patient currently has 1 doses left.  Next injection is scheduled for next week.    SPECIALTY MEDICATION ADHERENCE     Medication Adherence    Patient reported X missed doses in the last month: 0  Specialty Medication: ixekizumab  (TALTZ ) 80 mg/mL AtIn auto-injector  Patient is on additional specialty medications: No              Were doses missed due to medication being on hold? No    Taltz  80 mg/ml: 1 doses of medicine on hand        REFERRAL TO PHARMACIST     Referral to the pharmacist: Not needed      [facility]     Shipping address confirmed in Epic.     Cost and Payment: Patient has a $0 copay, payment information is not required.    Delivery Scheduled: Yes, Expected medication delivery date: 04/21/25.     Medication will be delivered via Same Day Courier to the prescription address in Epic [place].    Canary Ceo   [facility] Specialty and Home Delivery Pharm[facility]  Specialty Technician

## 2024-04-21 MED FILL — TALTZ AUTOINJECTOR 80 MG/ML SUBCUTANEOUS: SUBCUTANEOUS | 28 days supply | Qty: 1 | Fill #0

## 2024-05-04 ENCOUNTER — Other Ambulatory Visit: Payer: Self-pay | Admitting: Physician Assistant

## 2024-05-04 DIAGNOSIS — Z1231 Encounter for screening mammogram for malignant neoplasm of breast: Secondary | ICD-10-CM

## 2024-05-11 NOTE — Unmapped (Signed)
 T-sal shampoo (over the counter) for scalp        Purchase on [employer], for thick dry skin on feet and psoriasis plaques. Apply twice daily until smooth

## 2024-05-11 NOTE — Unmapped (Signed)
 Dermatology Note     Assessment and Plan:        Psoriasis with joint pain Chronic condition with exacerbation or progression.  8% BSA  -Tried and failed adalibumab (rash, not effective for joints). Now tried and failed Taltz .   -patient wishes to switch from Taltz  due to severe GI reaction and fatigue.   -unsure if this is a class effect from IL-17's  -since joints were improved on Taltz , reasonable to try to get Bimekizumab approved  -no known PH or FH IBD or depression  -if N/V fatigue occur on Bime can consider Skyrizi  -START T-sal for scalp  -Biologics Risks:  Discussed the risks, benefits, alternatives and complications of the biologic medications, such as reactivation of tuberculosis or fungal infections, liver damage, lowering of blood counts, injection site reactions, allergic reaction, psoriasis exacerbation, development of infections, development of cancers, especially lymphomas and skin cancer, development of autoimmune diseases such as lupus, development of demyelinating diseases such as multiple sclerosis, worsening of congestive heart failure.  The patient expressed understanding and wished to proceed.      High risk medication  -last Quant Gold 8/24 negative    Hyperkeratosis of feet, rash resolved on trunk and feet  -no evidence of fungus on exam today.   -rec Ebanel ([employer]) urea/sal acid cream      The patient was advised to call for an appointment should any new, changing, or symptomatic lesions develop.     RTC: Return in about 2 months (around 07/12/2024) for psoriasis-  . or sooner as needed   _________________________________________________________________      Chief Complaint     Chief Complaint   Patient presents with    Follow-up     Getting better a lot ; rash went away but still itching to R flank- saw allergist and he stopped allergy shots due to reaction and in wait time currently to see if they can be restarted as it is uncertain whether outside allergen or allergy shots caused the reaction    Medication Reaction     Taltz - vomiting, dizziness, and headache the day after each dose- last dose 04/29/24    Skin Problem     Ketoconazole  cream not helping skin issue for feet       HPI     Stephanie Hester is a 43 y.o. female who presents as a returning patient (last seen 04/13/2024) to [facility] for follow up of psoriasis on Taltz  (having side effects) and rash on feet.     Rash:   Tinea corporis v poison ivy   At last visit the following recommendations were given:   - START using clobetasol  on these areas, recommend continued avoidance of prednisone that she was given as this may flare her psoriasis   - CONTINUE ketoconazole  (NIZORAL ) 2 % cream; Apply 1 Application topically daily. Apply to affected skin   - CONTINUE ketoconazole  (NIZORAL ) 2 % shampoo;   - stop allergy shots for about 2-3 weeks while awaiting rash to resolve  - consider biopsy at future visit if still not improving    Today patient reports rash has improved but feet are still very dry, also complains of scaling on scalp.    Psoriasis  At last visit the following recommendations were given:   - Continue clobetasoL  (TEMOVATE ) 0.05 % external solution; Apply topically daily. To rash on scalp as needed for rash.    - Continue halobetasol  (ULTRAVATE ) 0.05 % ointment; Apply topically Two (2) times a  day. Onto rash on body for 2 weeks at a time as needed for rash. Stop after clearance of rash.   - Continue TALTZ  loading and then maintenance    Today patient reports nausea vomiting and fatigue after each Taltz  injection. States it has happened on 3 separate occasions. Joints feel better on Taltz  than Humira  but patients states she cannot function for 2-3 days after each injection of Taltz  and wants alternative.       Problem List    Past Medical History, Family History, Social History, Medication List, Allergies, and Problem List were reviewed in the rooming section of Epic.     ROS: Other than symptoms mentioned in the HPI, no fevers, chills, or other skin complaints    Physical Examination     GENERAL: Well-appearing female in no acute distress, resting comfortably.  NEURO: Alert and oriented, answers questions appropriately  PSYCH: Normal mood and affect  SKIN (Focal Skin Exam): Per patient request, examination of torso, feet, extremities, face, scalp  was performed  -faded pink geometric patches on trunk  -scalp with diffuse flaking  -bilateral feet with hyperkeratosis of heels and lateral foot    All areas not commented on are within normal limits or unremarkable      (Approved Template 07/09/2020)

## 2024-05-16 NOTE — Unmapped (Signed)
..  Stephanie Hester has been contacted in regards to their refill of Taltz . At this time, they have declined refill due to patient waiting on therapy change,Pt says provider is sending a new medication in to replace taltz ..will check back for new prescription on next refill attempt. YASMIN

## 2024-05-19 NOTE — Unmapped (Signed)
 Pt called nurse line and states that during her last visit with provider , she was told that the provider would send in a new rx.  She has still not received the new medication and pharmacy was trying to refill old prescription.    Kindly advise

## 2024-05-24 ENCOUNTER — Encounter

## 2024-05-25 ENCOUNTER — Ambulatory Visit
Admission: RE | Admit: 2024-05-25 | Discharge: 2024-05-25 | Disposition: A | Discharge status: Home / Self Care | Source: Ambulatory Visit | Attending: Physician Assistant | Admitting: Physician Assistant

## 2024-05-25 DIAGNOSIS — Z1231 Encounter for screening mammogram for malignant neoplasm of breast: Secondary | ICD-10-CM | POA: Insufficient documentation

## 2024-05-31 NOTE — Unmapped (Addendum)
 Hi Ms. Stephanie Hester,  I am sorry for the delay. I spoke with one of our MD's and she would like you to try one more dose of the Taltz  as these are not common side effects. Let me know if you would like some medication for the nausea  J.Sutton Hirsch PA-C    Hola Sra. [name],  Disculpe la demora. Habl?? con una de nuestras m??dicas y le gustar??a que probara una dosis m??s de Taltz , ya que estos efectos secundarios no son comunes. Av??seme si desea alg??n medicamento para las n??useas.  DOROTHA Pae PA-C      Sorry for the duplicate messages!  J.Anh Mangano PA-C

## 2024-06-01 MED ORDER — ONDANSETRON 8 MG DISINTEGRATING TABLET
ORAL_TABLET | Freq: Three times a day (TID) | ORAL | 0 refills | 7.00000 days | Status: CP | PRN
Start: 2024-06-01 — End: ?

## 2024-06-28 NOTE — Unmapped (Addendum)
 Dermatology Note     Assessment and Plan:      Assessment & Plan    Arthropathic psoriasis with Taltz  intolerance   BSA 5% with reduced joint pain on Taltz  but inadequate control of skin disease, with greatest impact on bilateral hands  Chronic condition with exacerbation or progression.  Tried and failed adalibumab (rash, not effective for joints). Now tried and failed Taltz .     Female of child bearing potential with Diabetes mellitus type 2; Methotrexate, cyclosporine, soriatane not appropriate due to potential for teratogenicity and effect on kidneys    Considering Cosentyx  due to insurance coverage and potential for reduced nausea. Discussed that switching may not yield different effects  If nausea continues then apply for Cosentyx  coverage and initiate treatment if approved.    Intralesional Kenalog  Procedure Note: After the patient was informed of risks (including atrophy and dyspigmentation), benefits and side effects of intralesional steroid injection, the patient elected to undergo injection and verbal consent was obtained. Skin was cleaned with alcohol and injected intralesionally into the sites (below). The patient tolerated the procedure well without complications and was instructed on post-procedure care.  Location(s): bilateral elbows, left knee, and bilateral dorsal hands and fingers.  Number of sites treated: 8  Kenalog  (triamcinolone ) Concentration: 10 mg/ml   Volume: 0.9 ml total    Hyperkeratosis of hands and feet:  Rec Ebanel 40% urea from [employer]    The patient was advised to call for an appointment should any new, changing, or symptomatic lesions develop.     RTC: Return in about 6 weeks (around 08/10/2024) for INJECTION. or sooner as needed   _________________________________________________________________      Chief Complaint     Chief Complaint   Patient presents with    Follow-up     2 month follow up for psoriasis, pt states it is better since last appt        HPI     Stephanie Hester is a 43 y.o. female who presents as a returning patient (last seen 05/11/2024) to Dermatology for follow up of psoriasis on Taltz . Patient experiences intense nausea for 2 days following Taltz  injection. Zofran  is helpful but makes her sleepy.     History of Present Illness  Stephanie Hester is a 43 year old female with psoriasis and arthritis who presents for management of her skin and joint symptoms.    Cutaneous symptoms  - Pruritic plaques present, with variable severity across different areas of the body  - Psoriasis  was better controlled while on Humira  but joints are better on Taltz , reports most bothersome on hands with thick itchy spots that are also affecting her socially    Adverse effects of biologic therapy  - Taltz  causes nausea, fatigue  after injections  - Discontinued Humira  due to increased joint pain    Patient states not pregnant and not planning pregnancy      The patient denies any other new or changing lesions or areas of concern.     Pertinent Past Medical History     No history of skin cancer      Family History:   Negative for melanoma    Past Medical History, Family History, Social History, Medication List, Allergies, and Problem List were reviewed in the rooming section of Epic.     ROS: Other than symptoms mentioned in the HPI, no fevers, chills, or other skin complaints    Physical Examination     GENERAL: Well-appearing female in no  acute distress, resting comfortably.  NEURO: Alert and oriented, answers questions appropriately  PSYCH: Normal mood and affect  RESP: No increased work of breathing  SKIN (Focal Skin Exam): Per patient request, examination of knees, UE, torso  was performed       Physical Exam  -scaly erythematous papules and plaques of bilateral  dorsal hands, elbows, knees    All areas not commented on are within normal limits or unremarkable  - female chaperone present      (Approved Template 11/28/2023)

## 2024-06-29 DIAGNOSIS — L409 Psoriasis, unspecified: Principal | ICD-10-CM

## 2024-06-29 DIAGNOSIS — Z79899 Other long term (current) drug therapy: Principal | ICD-10-CM

## 2024-06-29 DIAGNOSIS — L859 Epidermal thickening, unspecified: Principal | ICD-10-CM

## 2024-06-29 DIAGNOSIS — R110 Nausea: Principal | ICD-10-CM

## 2024-06-29 MED ORDER — SCOPOLAMINE 1 MG OVER 3 DAYS TRANSDERMAL PATCH
MEDICATED_PATCH | TRANSDERMAL | 12 refills | 30.00000 days | Status: CP
Start: 2024-06-29 — End: 2025-06-29

## 2024-06-29 NOTE — Unmapped (Signed)
 [facility] Refill Coordination Note    Specialty Medication(s) to be Shipped:   Inflammatory Disorders: Taltz     Other medication(s) to be shipped: No additional medications requested for fill at this time    Specialty Medications not needed at this time: N/A     Stephanie Hester, DOB: 03/24/1981  Phone: There are no phone numbers on file.      All above HIPAA information was verified with patient.     Was a Nurse, learning disability used for this call? No    Completed refill call assessment today to schedule patient's medication shipment from the [facility]  [phone]).  All relevant notes have been reviewed.     Specialty medication(s) and dose(s) confirmed: Regimen is correct and unchanged.   Changes to medications: Stephanie Hester reports no changes at this time.  Changes to insurance: No  New side effects reported not previously addressed with a pharmacist or physician: None reported  Questions for the pharmacist: No    Confirmed patient received a Conservation officer, historic buildings and a Surveyor, mining with first shipment. The patient will receive a drug information handout for each medication shipped and additional FDA Medication Guides as required.       DISEASE/MEDICATION-SPECIFIC INFORMATION        For patients on injectable medications: Next injection is scheduled for 07/02/24.    SPECIALTY MEDICATION ADHERENCE     Medication Adherence    Patient reported X missed doses in the last month: 0  Specialty Medication: TALTZ  AUTOINJECTOR 80 mg/mL Atin auto-injector (ixekizumab )  Patient is on additional specialty medications: No              Were doses missed due to medication being on hold? No    TALTZ  AUTOINJECTOR 80 mg/mL Atin auto-injector (ixekizumab ): 0 doses of medicine on hand       REFERRAL TO PHARMACIST     Referral to the pharmacist: Not needed      [facility]     Shipping address confirmed in Epic.     Cost and Payment: Patient has a copay of $4. They are aware and have authorized the pharmacy to charge the credit card on file.    Delivery Scheduled: Yes, Expected medication delivery date: 07/01/24.     Medication will be delivered via Same Day Courier to the prescription address in Epic WAM.    Chamille Werntz   [facility]  Specialty Technician

## 2024-06-29 NOTE — Unmapped (Addendum)
 Purchase on [employer], for thick dry skin. Apply twice daily until smooth         Meet your team:     Your intake nurse is: Roxie    Please remember to fill out the survey you will receive after your visit. Your comments help us  continue to improve our care.      Thanks in advance!      [facility] Clinical Staff

## 2024-07-01 NOTE — Unmapped (Signed)
 Stephanie Hester 's Taltz  shipment will be delayed as a result of prior authorization being required by the patient's insurance.     I have reached out to the patient  at [phone] and communicated the delay. We will call the patient back to reschedule the delivery upon resolution. We have not confirmed the new delivery date.

## 2024-07-04 DIAGNOSIS — L4050 Arthropathic psoriasis, unspecified: Principal | ICD-10-CM

## 2024-07-04 MED ORDER — COSENTYX PEN 300 MG/2 PENS (150 MG/ML) SUBCUTANEOUS PEN INJECTOR
SUBCUTANEOUS | 0 refills | 0.00000 days | Status: CP
Start: 2024-07-04 — End: ?
  Filled 2024-07-18: qty 8, 28d supply, fill #0

## 2024-07-04 NOTE — Unmapped (Signed)
 Belem Mala Gibbard 's Taltz  shipment will be canceled as a result of PA denied.     I have reached out to the patient  at [phone] and communicated the delay. We will not reschedule the medication and have removed this/these medication(s) from the work request.  We have canceled this work request.

## 2024-07-05 DIAGNOSIS — L4050 Arthropathic psoriasis, unspecified: Principal | ICD-10-CM

## 2024-07-15 NOTE — Unmapped (Signed)
 Stephanie Hester    Patient Onboarding/Medication Counseling    Stephanie Hester is a 43 y.o. female with psoriasis and PsA who I am counseling today on initiation of therapy.  I am speaking to the patient.    Was a Nurse, learning disability used for this call? No    Verified patient's date of birth / HIPAA.    Specialty medication(s) to be sent: Inflammatory Disorders: Cosentyx       Non-specialty medications/supplies to be sent: na      Medications not needed at this time: na         Cosentyx  (secukinumab )    Medication & Administration     Dosage: Plaque psoriasis: Inject 300mg  under the skin at weeks 0, 1, 2, 3, and 4 followed by 300mg  every 4 weeks      Lab tests required prior to treatment initiation:  Tuberculosis: Tuberculosis screening resulted in a non-reactive Quantiferon TB Gold assay.      Administration:       Prefilled Sensoready?? auto-injector pen  - inject 2 pens    Gather all supplies needed for injection on a clean, flat working surface: medication pen removed from packaging, alcohol swab, sharps container, etc.  Look at the medication label - look for correct medication, correct dose, and check the expiration date  Look at the medication - the liquid visible in the window on the side of the pen device should appear clear and colorless to slightly yellow  Lay the auto-injector pen on a flat surface and allow it to warm up to room temperature for at least 15-30 minutes  Select injection site - you can use the front of your thigh or your belly (but not the area 2 inches around your belly button); if someone else is giving you the injection you can also use your upper arm in the skin covering your triceps muscle  Prepare injection site - wash your hands and clean the skin at the injection site with an alcohol swab and let it air dry, do not touch the injection site again before the injection  Twist off the purple safety cap in the direction of the arrow, do not remove until immediately prior to injection and do not touch the yellow needle cover  Put the white needle cover against your skin at the injection site at a 90 degree angle, hold the pen such that you can see the clear medication window  Press down and hold the pen firmly against your skin, there will be a click when the injection starts  Continue to hold the pen firmly against your skin for about 10-15 seconds - the window will start to turn solid green  There will be a second click sound when the injection is almost complete, verify the window is solid green to indicate the injection is complete and then pull the pen away from your skin  Dispose of the used auto-injector pen immediately in your sharps disposal container the needle will be covered automatically  If you see any blood at the injection site, press a cotton ball or gauze on the site and maintain pressure until the bleeding stops, do not rub the injection site      Adherence/Missed dose instructions:  If your injection is given more than 4 days after your scheduled injection date - consult your pharmacist for additional instructions on how to adjust your dosing schedule.        Goals of Therapy  Plaque Psoriasis  Minimize areas of skin involvement (% BSA)  Avoidance of long term glucocorticoid use  Maintenance of effective psychosocial functioning      Psoriatic arthritis  Achieve remission/inactive disease or low/minimal disease activity  Maintenance of function  Minimization of systemic manifestations and comorbidities  Maintenance of effective psychosocial functioning      Side Effects & Monitoring Parameters     Injection site reaction (redness, irritation, inflammation localized to the site of administration)  Signs of a common cold - minor sore throat, runny or stuffy nose, etc.  Diarrhea    The following side effects should be reported to the provider:  Signs of a hypersensitivity reaction - rash; hives; itching; red, swollen, blistered, or peeling skin; wheezing; tightness in the chest or throat; difficulty breathing, swallowing, or talking; swelling of the mouth, face, lips, tongue, or throat; etc.  Reduced immune function - report signs of infection such as fever; chills; body aches; very bad sore throat; ear or sinus pain; cough; more sputum or change in color of sputum; pain with passing urine; wound that will not heal, etc.  Also at a slightly higher risk of some malignancies (mainly skin and blood cancers) due to this reduced immune function.  In the case of signs of infection - the patient should hold the next dose of Cosentyx ?? and call your primary care provider to ensure adequate medical care.  Treatment may be resumed when infection is treated and patient is asymptomatic.  Muscle pain or weakness  Shortness of breath      Warnings, Precautions, & Contraindications     Have your bloodwork checked as you have been told by your prescriber  Talk with your doctor if you are pregnant, planning to become pregnant, or breastfeeding  Discuss the possible need for holding your dose(s) of Cosentyx ?? when a planned procedure is scheduled with the prescriber as it may delay healing/recovery timeline       Drug/Food Interactions     Medication list reviewed in Epic. The patient was instructed to inform the care team before taking any new medications or supplements. No drug interactions identified.   If you have a latex allergy use caution when handling, the needle cap of the Cosentyx ?? prefilled syringe and the safety cap for the Cosentyx  Sensoready?? pen contains a derivative of natural rubber latex. Unoready?? pen does NOT contain latex.   Talk with you prescriber or pharmacist before receiving any live vaccinations while taking this medication and after you stop taking it      Storage, Handling Precautions, & Disposal     Store this medication in the refrigerator.  Do not freeze  May store intact Sensoready pens and 150 mg/mL prefilled syringes at <=30??C (<=86??F) for up to 4 days; may return to the refrigerator if unused  Store in original packaging, protected from light  Do not shake  Dispose of used syringes/pens in a sharps disposal container           Current Medications (including OTC/herbals), Comorbidities and Allergies     Current Medications[1]    Allergies[2]    Problem List[3]    Medication list has been reviewed and updated in Epic: Yes    Allergies have been reviewed and updated in Epic: Yes    Appropriateness of Therapy     Acute infections noted within Epic:  No active infections  Patient reported infection: None    Is the medication and dose appropriate considering the patient???s diagnosis, treatment, and disease  journey, comorbidities, medical history, current medications, allergies, therapeutic goals, self-administration ability, and access barriers? Yes    Prescription has been clinically reviewed: Yes      Baseline Quality of Life Assessment      How many days over the past month did your psoriasis  keep you from your normal activities? For example, brushing your teeth or getting up in the morning. Patient declined to answer    Financial Information     Medication Assistance provided: Prior Authorization    Anticipated copay of $4 reviewed with patient. Verified delivery address.    Delivery Information     Scheduled delivery date: 9/22    Expected start date: 9/22      Medication will be delivered via Same Day Courier to the prescription address in [facility].  This shipment will not require a signature.      Explained the services we provide at [facility] and that each month we would call to set up refills.  Stressed importance of returning phone calls so that we could ensure they receive their medications in time each month.  Informed patient that we should be setting up refills 7-10 days prior to when they will run out of medication.  A pharmacist will reach out to perform a clinical assessment periodically.  Informed patient that a welcome packet, containing information about our [facility] and other support services, a Notice of Privacy Practices, and a drug information handout will be sent.      The patient or caregiver noted above participated in the development of this care plan and knows that they can request review of or adjustments to the care plan at any time.      Patient or caregiver verbalized understanding of the above information as well as how to contact the [facility] at [phone] option 4 with any questions/concerns.  The [facility] is open Monday through Friday 8:30am-4:30pm.  A pharmacist is available 24/7 via pager to answer any clinical questions they may have.    Patient Specific Needs     Does the patient have any physical, cognitive, or cultural barriers? No    Does the patient have adequate living arrangements? (i.e. the ability to store and take their medication appropriately) Yes    Did you identify any home environmental safety or security hazards? No    Patient prefers to have medications discussed with  Patient     Is the patient or caregiver able to read and understand education materials at a high school level or above? Yes    Patient's primary language is  English     Is the patient high risk? No    Does the patient have an additional or emergency contact listed in their chart? Yes    SOCIAL DETERMINANTS OF HEALTH     At the [facility], we have learned that life circumstances - like trouble affording food, housing, utilities, or transportation can affect the health of many of our patients.   That is why we wanted to ask: are you currently experiencing any life circumstances that are negatively impacting your health and/or quality of life? Patient declined to answer    Social Drivers of Health     Food Insecurity: No Food Insecurity (03/11/2018)    Hunger Vital Sign     Worried About Running Out of Food in the Last Year: Never true     Ran Out of Food in the Last Year: Never true   Tobacco  Use: Low Risk (06/29/2024)    Patient History     Smoking Tobacco Use: Never     Smokeless Tobacco Use: Never     Passive Exposure: Not on file   Transportation Needs: No Transportation Needs (03/11/2018)    PRAPARE - Transportation     Lack of Transportation (Medical): No     Lack of Transportation (Non-Medical): No   Alcohol Use: Not on file   Housing: Unknown (01/31/2021)    Housing     Within the past 12 months, have you ever stayed: outside, in a car, in a tent, in an overnight shelter, or temporarily in someone else's home (i.e. couch-surfing)?: No     Are you worried about losing your housing?: Not on file   Physical Activity: Not on file   Utilities: Not on file   Stress: Not on file   Interpersonal Safety: Not on file   Substance Use: Not on file (08/31/2023)   Intimate Partner Violence: Unknown (03/11/2018)    Humiliation, Afraid, Rape, and Kick questionnaire     Fear of Current or Ex-Partner: Not asked     Emotionally Abused: Not asked     Physically Abused: Not asked     Sexually Abused: Not asked   Social Connections: Not on file   Financial Resource Strain: Not on file   Health Literacy: Medium Risk (01/31/2021)    Health Literacy     : Rarely   Internet Connectivity: Not on file       Would you be willing to receive help with any of the needs that you have identified today? Not applicable       Stephanie [facility]         [1]   Current Outpatient Medications   Medication Sig Dispense Refill    ANTI-DIARRHEAL, LOPERAMIDE, 2 mg tablet       atorvastatin (LIPITOR) 20 MG tablet ATORVASTATIN CALCIUM  20 MG TABS      blood-glucose meter (FREESTYLE INSULINX) Misc Test fasting and one hour after each meal.  Any brand ok. 1 each 0    clindamycin  (CLEOCIN  T) 1 % external solution Apply topically up to two (2) times a day. Apply to the red irritated pus bumps on the scalp until they clear. Can restart if needed. 60 mL 4    clobetasol  (TEMOVATE ) 0.05 % external solution Apply topically daily to rash on scalp as needed for rash. 50 mL 5    clobetasol  (TEMOVATE ) 0.05 % ointment Apply twice daily to red, raised rash for 2 weeks or until smooth 60 g 2    cyanocobalamin, vitamin B-12, 1000 MCG tablet       empagliflozin (JARDIANCE) 25 mg tablet Take 1 tablet (25 mg total) by mouth daily.      empty container Misc Use as directed to dispose of used Humira  pens. 1 each 2    empty container Misc Use as directed to dispose of Taltz  pens. 1 each 2    EPINEPHrine (EPIPEN) 0.3 mg/0.3 mL injection       ergocalciferol-1,250 mcg, 50,000 unit, (DRISDOL) 1,250 mcg (50,000 unit) capsule       fluticasone propionate (FLONASE) 50 mcg/actuation nasal spray FLUTICASONE PROPIONATE 50 MCG/ACT SUSP      glucagon  (GLUCAGON ) 1 mg SolR Glucagon  emergency kit: use only as directed. 1 each 1    halobetasol  (ULTRAVATE ) 0.05 % ointment Apply topically Two (2) times a day onto rash on body  for 2 weeks at a time as needed for rash. Stop after clearance of rash. 100 g 4    hydrocortisone 2.5 % cream       hydrOXYzine (ATARAX) 10 MG tablet HYDROXYZINE HCL 10 MG TABS      ixekizumab  (TALTZ  AUTOINJECTOR) 80 mg/mL AtIn auto-injector Inject the contents of 2 pens (160 mg) under the skin  once, followed by the contents of 1 pen (80 mg)  at weeks 2, 4, 6, 8, 10 and 12, and then 80 mg every 4 weeks. 8 mL 0    ixekizumab  (TALTZ ) 80 mg/mL AtIn auto-injector Inject the contents of 1 pen (80 mg) under the skin every 4 weeks. 1 mL 9    ketoconazole  (NIZORAL ) 2 % cream Apply 1 Application topically daily. Apply to affected skin on feet. 30 g 5    ketoconazole  (NIZORAL ) 2 % shampoo Apply to face, neck, back/body as body wash; allow to soak for 5-10 min, then rinse off; use daily when scale is present, then weekly to prevent recurrence 120 mL 11    levothyroxine  137 mcg cap Take by mouth daily.      melatonin 5 mg tablet       metFORMIN  (GLUCOPHAGE ) 1000 MG tablet       metFORMIN  (GLUMETZA ) 500 MG (MOD) 24 hr tablet Take 1 tablet (500 mg total) by mouth two (2) times a day.      montelukast (SINGULAIR) 10 mg tablet MONTELUKAST SODIUM 10 MG TABS      ondansetron  (ZOFRAN -ODT) 8 MG disintegrating tablet Take 1 tablet (8 mg total) by mouth every eight (8) hours as needed for nausea. Sedation precautions 21 tablet 0    OZEMPIC 2 mg/dose (8 mg/3 mL) PnIj       peg-electrolyte soln (NULYTELY) 420 gram SolR Use as directed.      pioglitazone (ACTOS) 15 MG tablet Take 1 tablet (15 mg total) by mouth daily.      secukinumab  (COSENTYX  PEN, 2 PENS,) 150 mg/mL PnIj injection Inject the contents of 2 pens (300 mg) under the skin once weekly for 5 weeks (on weeks 0, 1, 2, 3, and 4) as loading dose. 10 mL 0    secukinumab  (COSENTYX  PEN, 2 PENS,) 150 mg/mL PnIj injection Inject the contents of 2 pens (300 mg total) under the skin every 28 days. Maintenance dose. 2 mL 11     Current Facility-Administered Medications   Medication Dose Route Frequency Provider Last Rate Last Admin    triamcinolone  acetonide (KENALOG ) injection 10 mg  10 mg Intradermal Once Elsa Delon Ishihara, PA       [2]   Allergies  Allergen Reactions    Corn Containing Products     Malt Extract     Wheat Containing Prod    [3]   Patient Active Problem List  Diagnosis    Type 2 diabetes mellitus    (CMS-HCC)    Arcuate uterus    Hypothyroidism    BMI 37.0-37.9, adult    Psoriasis    Hepatic steatosis    Myalgia of pelvic floor    Scar adherent    Abnormal uterine bleeding    Contraceptive education    Hyperkeratosis

## 2024-07-15 NOTE — Unmapped (Signed)
 [facility] Medication Onboarding    Specialty Medication: Cosentyx   Prior Authorization: Approved   Financial Assistance: No - copay  <$25  Final Copay/Day Supply: $4 / 28 days (LD & MD)    Insurance Restrictions: Yes - max 1 month supply     Notes to Pharmacist:   Credit Card on File: Yes  Start Date on Rx:      The triage team has completed the benefits investigation and has determined that the patient is able to fill this medication at [facility]. Please contact the patient to complete the onboarding or follow up with the prescribing physician as needed.

## 2024-08-03 DIAGNOSIS — L409 Psoriasis, unspecified: Principal | ICD-10-CM

## 2024-08-03 MED ORDER — CLOBETASOL 0.05 % SCALP SOLUTION
Freq: Every day | TOPICAL | 5 refills | 0.00000 days | Status: CP
Start: 2024-08-03 — End: ?
  Filled 2024-08-05: qty 50, 30d supply, fill #0

## 2024-08-03 NOTE — Unmapped (Signed)
 Merna reports her Cosentyx  is working well - she's had no adverse effects and her psoriasis is clearing.     Her 5th final loading dose is due on 10/20 and then maintenance to start 4 weeks later on 11/17.    [facility] Clinical Assessment & Refill Coordination Note    Juanell Saffo, DOB: 1981-10-11  Phone: There are no phone numbers on file.    All above HIPAA information was verified with patient.     Was a Nurse, learning disability used for this call? No    Specialty Medication(s):   Inflammatory Disorders: Cosentyx      Current Medications[1]     Changes to medications: Donesha reports no changes at this time.    Medication list has been reviewed and updated in Epic: Yes    Allergies[2]    Changes to allergies: No    Allergies have been reviewed and updated in Epic: Yes    SPECIALTY MEDICATION ADHERENCE     Cosentyx  150 mg (take 2 pens): 1 doses of medicine on hand     Medication Adherence    Patient reported X missed doses in the last month: 0  Specialty Medication: Cosentyx           Specialty medication(s) dose(s) confirmed: Regimen is correct and unchanged.     Are there any concerns with adherence? No    Adherence counseling provided? Not needed    CLINICAL MANAGEMENT AND INTERVENTION      Clinical Benefit Assessment:    Do you feel the medicine is effective or helping your condition? Yes    Clinical Benefit counseling provided? Not needed    Adverse Effects Assessment:    Are you experiencing any side effects? No    Are you experiencing difficulty administering your medicine? No    Quality of Life Assessment:    Quality of Life    Rheumatology  Oncology  Dermatology  1. What impact has your specialty medication had on the symptoms of your skin condition (i.e. itchiness, soreness, stinging)?: Some  2. What impact has your specialty medication had on your comfort level with your skin?: Some  Cystic Fibrosis          How many days over the past month did your ps, psa  keep you from your normal activities? For example, brushing your teeth or getting up in the morning. Patient declined to answer    Have you discussed this with your provider? Not needed    Acute Infection Status:    Acute infections noted within Epic:  No active infections    Patient reported infection: None    Therapy Appropriateness:    Is the medication and dose appropriate considering the patient???s diagnosis, treatment, and disease journey, comorbidities, medical history, current medications, allergies, therapeutic goals, self-administration ability, and access barriers? Yes, therapy is appropriate and should be continued     Clinical Intervention:    Was an intervention completed as part of this clinical assessment? No    DISEASE/MEDICATION-SPECIFIC INFORMATION      For patients on injectable medications: Next injection is scheduled for 10/13 (this delivery for 5th/final load to be given on 10/20 before maintenance starts 4 weeks later).    Chronic Inflammatory Diseases: Have you experienced any flares in the last month? No  Has this been reported to your provider? No    PATIENT SPECIFIC NEEDS     Does the patient have any physical, cognitive, or cultural barriers? No  Is the patient high risk? No    Does the patient require physician intervention or other additional services (i.e., nutrition, smoking cessation, social work)? No    Does the patient have an additional or emergency contact listed in their chart? Yes    SOCIAL DETERMINANTS OF HEALTH     At the [facility], we have learned that life circumstances - like trouble affording food, housing, utilities, or transportation can affect the health of many of our patients.   That is why we wanted to ask: are you currently experiencing any life circumstances that are negatively impacting your health and/or quality of life? Patient declined to answer    Social Drivers of Health     Food Insecurity: No Food Insecurity (03/11/2018)    Hunger Vital Sign     Worried About Running Out of Food in the Last Year: Never true     Ran Out of Food in the Last Year: Never true   Tobacco Use: Low Risk  (06/29/2024)    Patient History     Smoking Tobacco Use: Never     Smokeless Tobacco Use: Never     Passive Exposure: Not on file   Transportation Needs: No Transportation Needs (03/11/2018)    PRAPARE - Transportation     Lack of Transportation (Medical): No     Lack of Transportation (Non-Medical): No   Alcohol Use: Not on file   Housing: Unknown (01/31/2021)    Housing     Within the past 12 months, have you ever stayed: outside, in a car, in a tent, in an overnight shelter, or temporarily in someone else's home (i.e. couch-surfing)?: No     Are you worried about losing your housing?: Not on file   Physical Activity: Not on file   Utilities: Not on file   Stress: Not on file   Interpersonal Safety: Not on file   Substance Use: Not on file (08/31/2023)   Intimate Partner Violence: Unknown (03/11/2018)    Humiliation, Afraid, Rape, and Kick questionnaire     Fear of Current or Ex-Partner: Not asked     Emotionally Abused: Not asked     Physically Abused: Not asked     Sexually Abused: Not asked   Social Connections: Not on file   Financial Resource Strain: Not on file   Health Literacy: Medium Risk (01/31/2021)    Health Literacy     : Rarely   Internet Connectivity: Not on file       Would you be willing to receive help with any of the needs that you have identified today? Not applicable       SHIPPING     Specialty Medication(s) to be Shipped:   Inflammatory Disorders: Cosentyx     Other medication(s) to be shipped: clobetasol  solution (refill requested)    Specialty Medications not needed at this time: N/A     Changes to insurance: No    Cost and Payment: Patient has a copay of $8. They are aware and have authorized the pharmacy to charge the credit card on file.    Delivery Scheduled: Yes, Expected medication delivery date: 10/10.     Medication will be delivered via Same Day Courier to the confirmed prescription address in [facility].    The patient will receive a drug information handout for each medication shipped and additional FDA Medication Guides as required.  Verified that patient has previously received a Conservation officer, historic buildings and a Surveyor, mining.  The patient or caregiver noted above participated in the development of this care plan and knows that they can request review of or adjustments to the care plan at any time.      All of the patient's questions and concerns have been addressed.    [facility] Specialty Pharmacist         [1]   Current Outpatient Medications   Medication Sig Dispense Refill    ANTI-DIARRHEAL, LOPERAMIDE, 2 mg tablet       atorvastatin (LIPITOR) 20 MG tablet ATORVASTATIN CALCIUM  20 MG TABS      blood-glucose meter (FREESTYLE INSULINX) Misc Test fasting and one hour after each meal.  Any brand ok. 1 each 0    clindamycin  (CLEOCIN  T) 1 % external solution Apply topically up to two (2) times a day. Apply to the red irritated pus bumps on the scalp until they clear. Can restart if needed. 60 mL 4    clobetasol  (TEMOVATE ) 0.05 % external solution Apply topically daily to rash on scalp as needed for rash. 50 mL 5    clobetasol  (TEMOVATE ) 0.05 % ointment Apply twice daily to red, raised rash for 2 weeks or until smooth 60 g 2    cyanocobalamin, vitamin B-12, 1000 MCG tablet       empagliflozin (JARDIANCE) 25 mg tablet Take 1 tablet (25 mg total) by mouth daily.      empty container Misc Use as directed to dispose of used Humira  pens. 1 each 2    empty container Misc Use as directed to dispose of Taltz  pens. 1 each 2    EPINEPHrine (EPIPEN) 0.3 mg/0.3 mL injection       ergocalciferol-1,250 mcg, 50,000 unit, (DRISDOL) 1,250 mcg (50,000 unit) capsule       fluticasone propionate (FLONASE) 50 mcg/actuation nasal spray FLUTICASONE PROPIONATE 50 MCG/ACT SUSP      glucagon  (GLUCAGON ) 1 mg SolR Glucagon  emergency kit: use only as directed. 1 each 1    halobetasol  (ULTRAVATE ) 0.05 % ointment Apply topically Two (2) times a day onto rash on body for 2 weeks at a time as needed for rash. Stop after clearance of rash. 100 g 4    hydrocortisone 2.5 % cream       hydrOXYzine (ATARAX) 10 MG tablet HYDROXYZINE HCL 10 MG TABS      ixekizumab  (TALTZ  AUTOINJECTOR) 80 mg/mL AtIn auto-injector Inject the contents of 2 pens (160 mg) under the skin  once, followed by the contents of 1 pen (80 mg)  at weeks 2, 4, 6, 8, 10 and 12, and then 80 mg every 4 weeks. 8 mL 0    ketoconazole  (NIZORAL ) 2 % cream Apply 1 Application topically daily. Apply to affected skin on feet. 30 g 5    ketoconazole  (NIZORAL ) 2 % shampoo Apply to face, neck, back/body as body wash; allow to soak for 5-10 min, then rinse off; use daily when scale is present, then weekly to prevent recurrence 120 mL 11    levothyroxine  137 mcg cap Take by mouth daily.      melatonin 5 mg tablet       metFORMIN  (GLUCOPHAGE ) 1000 MG tablet       metFORMIN  (GLUMETZA ) 500 MG (MOD) 24 hr tablet Take 1 tablet (500 mg total) by mouth two (2) times a day.      montelukast (SINGULAIR) 10 mg tablet MONTELUKAST SODIUM 10 MG TABS      ondansetron  (  ZOFRAN -ODT) 8 MG disintegrating tablet Take 1 tablet (8 mg total) by mouth every eight (8) hours as needed for nausea. Sedation precautions 21 tablet 0    OZEMPIC 2 mg/dose (8 mg/3 mL) PnIj       peg-electrolyte soln (NULYTELY) 420 gram SolR Use as directed.      pioglitazone (ACTOS) 15 MG tablet Take 1 tablet (15 mg total) by mouth daily.      secukinumab  (COSENTYX  PEN, 2 PENS,) 150 mg/mL PnIj injection Inject the contents of 2 pens (300 mg) under the skin once weekly for 5 weeks (on weeks 0, 1, 2, 3, and 4) as loading dose. 10 mL 0    secukinumab  (COSENTYX  PEN, 2 PENS,) 150 mg/mL PnIj injection Inject the contents of 2 pens (300 mg total) under the skin every 28 days. Maintenance dose. 2 mL 11     Current Facility-Administered Medications   Medication Dose Route Frequency Provider Last Rate Last Admin    triamcinolone  acetonide (KENALOG ) injection 10 mg  10 mg Intradermal Once Elsa Delon Ishihara, PA       [2]   Allergies  Allergen Reactions    Corn Containing Products     Malt Extract     Wheat Containing Prod

## 2024-08-03 NOTE — Unmapped (Signed)
 Appointment on 10/17    Thanks,  Charmaine

## 2024-08-05 MED FILL — COSENTYX PEN 300 MG/2 PENS (150 MG/ML) SUBCUTANEOUS PEN INJECTOR: SUBCUTANEOUS | 28 days supply | Qty: 2 | Fill #1

## 2024-08-12 DIAGNOSIS — L409 Psoriasis, unspecified: Principal | ICD-10-CM

## 2024-08-12 DIAGNOSIS — Z79899 Other long term (current) drug therapy: Principal | ICD-10-CM

## 2024-08-12 NOTE — Unmapped (Signed)
 Si tiene [employer] visita o para comunicarse con Emergency planning/management officer, llame a nuestro centro de atenci??n telef??nica al: [phone]    Para recetas renovables: Comun??quese con su farmacia y p??dales que nos env??en a la cl??nica una solicitud para una receta renovable.    En lo referente a resultados de an??lisis y biopsias: Si no recibe noticias del proveedor en un plazo de 2 semanas, comun??quese con nuestra oficina al tel??fono arriba mencionado o a trav??s de My [place] Chart.      Kathlynn por elegir [facility].

## 2024-08-12 NOTE — Unmapped (Signed)
 Dermatology Note     Assessment and Plan:      Assessment & Plan    Psoriasis - chronic, stable  - The diagnosis and treatment options were discussed at length with the patient.  - Previously tried and failed Humira  and Taltz   - Currently under good control with Cosentyx  (recently started). Denies SE of therapy. Would like to continue current therapy.  - Continue Cosentyx  300 mg subcutaneous every 28 days  - Side effects and appropriate use of the medications were discussed with the patient today.    High risk medication use  - Side effects and appropriate use of the medications were discussed with the patient today.    The patient was advised to call for an appointment should any new, changing, or symptomatic lesions develop.     RTC: Return in about 1 year (around 08/12/2025) for Psoriasis. or sooner as needed   _________________________________________________________________    Chief Complaint     Chief Complaint   Patient presents with    Follow-up     Medication change     HPI     History of Present Illness  Stephanie Hester is a 43 year old female with psoriasis who presents for follow-up on her treatment with Cosentyx .    She has experienced significant improvement in her psoriasis symptoms with Cosentyx , with nearly complete resolution. Previously treated with Humira  and Taltz , Cosentyx  has yielded better results. Her psoriasis is now minimal, with only a small area on her elbow remaining. She is on a regimen of Cosentyx  300 mg every four weeks.    Joint symptoms have improved, with only slight discomfort at the base of her joints, which she describes as 'really good'.    She experiences no nausea, vomiting, or decreased energy levels and feels more energetic.    Results      The patient denies any other new or changing lesions or areas of concern.     Pertinent Past Medical History     Problem List          Musculoskeletal and Integument    Psoriasis - Primary    Currently taking Adalimumab  q 2 weeks with good response. Followed by [facility] D[facility]/Rheumatology-->last visit on 08/07/20. Recommendations at that time:    Psoriasis  - Patient has previously tried and failed topical steroids  - Patient is now pregnant (did have recent miscarriage prior to starting adalimumab ). She was originally prescribed ustekinumab  but this was denied  - Patient has been taking Adalimumab  (stopped 1 dose due to new rash). Discussed that from dermatologic perspective, we are agreeable to continuing this medication during pregnancy but defer to OBGYN. Patient plans to establish care with high risk OBGYN for her pre natal care  - recommend start clobetasol  0.05% ointment bid to affected area until flat and smooth    Tumor necrosis factor (TNF)-alpha blockers may be used in women who require these medications for the maintenance or establishment of control of active inflammatory disease during pregnancy. Some expert guidance suggests these medications should be discontinued in the late second or early third trimester. However, use of these drugs can be extended, if necessary, to a later gestational age if benefits outweigh potential risks for an individual patient.     -08/07/20: Quant gold negative, HIV: non-reactive, Hep C ab: non-reactive, Hep B surf ab: non-reactive, Hep B core ab: non-reactive  -Pt is s/p 2nd dose of Moderna COVID-19 vaccination. Considering influenza vaccination at next visit (declined today).  -  Will continue Adalimumab  at this time  [ ]  Follow-up with dermatology on 11/13/20          Family History:     Past Medical History, Family History, Social History, Medication List, Allergies, and Problem List were reviewed in the rooming section of Epic.     ROS: Other than symptoms mentioned in the HPI, no fevers, chills, or other skin complaints    Physical Examination     Physical Exam  GENERAL: Alert, cooperative, well developed, no acute distress.  SKIN:   Left elbow with a very small pink psoriasiform papule (TBSA < 1% today on biologic therapy)    All areas not commented on are within normal limits or unremarkable    Elsie MICAEL Campi, MD, MPH, FAAD   Professor, Department of Dermatology    [address], [address]  [place], [place] [address]  [phone]    [address]  [place], [place] [address]  [phone]    (Approved Template 11/28/2023)

## 2024-08-30 NOTE — Progress Notes (Addendum)
 This pharmacist was notified by a technician that this patient would like to be contacted by the pharmacist per their MyChart questionnaire response.. I have reached out to the patient and have determined that no further pharmacist action is needed. - it seems she may have selected this option on accident.      Approximate time spent: 0-5 minutes    Gerard DELENA Sharps, Clinical Specialty Pharmacist  [facility]          [facility] Delivery [facility] Refill Coordination Note    Stephanie Hester, [place]: [birth year]  Phone: There are no phone numbers on file.      All above HIPAA information was verified with patient.         08/30/2024     9:25 AM   Specialty Rx Medication Refill Questionnaire   Which Medications would you like refilled and shipped? Concentrix   Please list all current allergies: Corn   Have you missed any doses in the last 30 days? No   Have you had any changes to your medication(s) since your last refill? No   How much of each medication do you have remaining at home? (eg. number of tablets, injections, etc.) None   If receiving an injectable medication, next injection date is 09/19/2024   Have you experienced any side effects in the last 30 days? No   Please enter the full address (street address, city, state, zip code) where you would like your medication(s) to be delivered to. [address] [place] [address]   Please specify on which day you would like your medication(s) to arrive. Note: if you need your medication(s) within 3 days, please call the pharmacy to schedule your order at [phone]  09/02/2024   Has your insurance changed since your last refill? No   Would you like a pharmacist to call you to discuss your medication(s)? Yes   Please enter the preferred phone number where you can be reached. [phone]   Do you require a signature for your package? (Note: if we are billing Medicare Part B or your order contains a controlled substance, we will require a signature) No   I have been provided my out of pocket cost for my medication and approve the pharmacy to charge the amount to my credit card on file. Yes         Completed refill call assessment today to schedule patient's medication shipment from the [facility] and [facility] Delivery Pharmacy [phone]).  All relevant notes have been reviewed.       Confirmed patient received a Conservation Officer, Historic Buildings and a Surveyor, Mining with first shipment. The patient will receive a drug information handout for each medication shipped and additional FDA Medication Guides as required.         REFERRAL TO PHARMACIST     Referral to the pharmacist: Yes - patient request      SHIPPING     Shipping address confirmed in Epic.     Delivery Scheduled: Yes, Expected medication delivery date: 11/7.     Medication will be delivered via Same Day Courier to the prescription address in Epic WAM.    Eleanor JAYSON [facility] Specialty Technician

## 2024-09-02 MED FILL — COSENTYX PEN 300 MG/2 PENS (150 MG/ML) SUBCUTANEOUS PEN INJECTOR: SUBCUTANEOUS | 28 days supply | Qty: 2 | Fill #0

## 2024-10-04 NOTE — Progress Notes (Signed)
 [facility] Delivery Pharm[facility] Refill Coordination Note    Specialty Medication(s) to be Shipped:   Inflammatory Disorders: Cosentyx     Other medication(s) to be shipped: No additional medications requested for fill at this time    Specialty Medications not needed at this time: N/A     Stephanie Hester, DOB: Jan 18, 1981  Phone: There are no phone numbers on file.      All above HIPAA information was verified with patient.     Was a nurse, learning disability used for this call? No    Completed refill call assessment today to schedule patient's medication shipment from the [facility] and [facility] Delivery Pharmacy  [phone]).  All relevant notes have been reviewed.     Specialty medication(s) and dose(s) confirmed: Regimen is correct and unchanged.   Changes to medications: Delsa reports no changes at this time.  Changes to insurance: No  New side effects reported not previously addressed with a pharmacist or physician: None reported  Questions for the pharmacist: No    Confirmed patient received a Conservation Officer, Historic Buildings and a Surveyor, Mining with first shipment. The patient will receive a drug information handout for each medication shipped and additional FDA Medication Guides as required.       DISEASE/MEDICATION-SPECIFIC INFORMATION        For patients on injectable medications: Next injection is scheduled for 10/17/24.    SPECIALTY MEDICATION ADHERENCE     Medication Adherence    Patient reported X missed doses in the last month: 0  Specialty Medication: COSENTYX  PEN (2 PENS) 150 mg/mL Pnij injection (secukinumab )  Patient is on additional specialty medications: No              Were doses missed due to medication being on hold? No     COSENTYX  PEN (2 PENS) 150 mg/mL Pnij injection (secukinumab ): 0 doses of medicine on hand       REFERRAL TO PHARMACIST     Referral to the pharmacist: Not needed      [facility]     Shipping address confirmed in Epic.     Cost and Payment: Patient has a copay of $4. They are aware and have authorized the pharmacy to charge the credit card on file.    Delivery Scheduled: Yes, Expected medication delivery date: 10/11/24.     Medication will be delivered via Next Day Courier to the prescription address in Epic WAM.    Dena LOISE Dolores   [facility] Delivery Pharm[facility]  Specialty Technician

## 2024-10-10 MED FILL — COSENTYX PEN 300 MG/2 PENS (150 MG/ML) SUBCUTANEOUS PEN INJECTOR: SUBCUTANEOUS | 28 days supply | Qty: 2 | Fill #1

## 2024-11-01 DIAGNOSIS — L4050 Arthropathic psoriasis, unspecified: Principal | ICD-10-CM

## 2024-11-07 NOTE — Progress Notes (Signed)
 [facility] Specialty and Home Delivery Pharmacy Refill Coordination Note    Lauramae Kneisley, DOB: 06-Nov-1980  Phone: There are no phone numbers on file.      All above HIPAA information was verified with patient.         11/05/2024    11:05 AM   Specialty Rx Medication Refill Questionnaire   Which Medications would you like refilled and shipped? Consentix, I don???t have lef   Please list all current allergies: Same   Have you missed any doses in the last 30 days? No   Have you had any changes to your medication(s) since your last refill? No   How much of each medication do you have remaining at home? (eg. number of tablets, injections, etc.) 0   If receiving an injectable medication, next injection date is 11/21/2024   Have you experienced any side effects in the last 30 days? No   Please enter the full address (street address, city, state, zip code) where you would like your medication(s) to be delivered to. [address] [place] [address]   Please specify on which day you would like your medication(s) to arrive. Note: if you need your medication(s) within 3 days, please call the pharmacy to schedule your order at [phone]  11/11/2024   Has your insurance changed since your last refill? No   Would you like a pharmacist to call you to discuss your medication(s)? No   Do you require a signature for your package? (Note: if we are billing Medicare Part B or your order contains a controlled substance, we will require a signature) No   I have been provided my out of pocket cost for my medication and approve the pharmacy to charge the amount to my credit card on file. Yes         Completed refill call assessment today to schedule patient's medication shipment from the [facility] and [facility] Delivery Pharmacy [phone]).  All relevant notes have been reviewed.       Confirmed patient received a Conservation Officer, Historic Buildings and a Surveyor, Mining with first shipment. The patient will receive a drug information handout for each medication shipped and additional FDA Medication Guides as required.         REFERRAL TO PHARMACIST     Referral to the pharmacist: Not needed      [facility]     Shipping address confirmed in Epic.     Delivery Scheduled: Yes, Expected medication delivery date: 11/11/24.     Medication will be delivered via Next Day Courier to the prescription address in Epic WAM.    Dena LOISE Dolores   [facility] Specialty and Home Delivery Pharmacy Specialty Technician

## 2024-11-10 MED FILL — COSENTYX PEN 300 MG/2 PENS (150 MG/ML) SUBCUTANEOUS PEN INJECTOR: SUBCUTANEOUS | 28 days supply | Qty: 2 | Fill #2

## 2024-11-30 ENCOUNTER — Ambulatory Visit

## 2024-11-30 ENCOUNTER — Ambulatory Visit (INDEPENDENT_AMBULATORY_CARE_PROVIDER_SITE_OTHER)

## 2024-11-30 DIAGNOSIS — M2011 Hallux valgus (acquired), right foot: Secondary | ICD-10-CM

## 2024-11-30 DIAGNOSIS — M21611 Bunion of right foot: Secondary | ICD-10-CM | POA: Diagnosis not present

## 2024-11-30 DIAGNOSIS — M21619 Bunion of unspecified foot: Secondary | ICD-10-CM

## 2024-11-30 DIAGNOSIS — M25374 Other instability, right foot: Secondary | ICD-10-CM

## 2024-11-30 DIAGNOSIS — M21961 Unspecified acquired deformity of right lower leg: Secondary | ICD-10-CM

## 2024-12-01 NOTE — Progress Notes (Signed)
 "  Subjective:  Patient ID: Caroline Pollard, female    DOB: [birth year],  MRN: [id number]  Chief Complaint  Patient presents with   Bunions     right foot bunion x months. Pain scale 8/10. Has used hot and cold therapy previously. The referring MD gave her some steroids that she takes when it's really bad     Discussed the use of AI scribe software for clinical note transcription with the patient, who gave verbal consent to proceed.  History of Present Illness Caroline Pollard is a 44 year old female with type 2 diabetes mellitus and psoriasis who presents with right great toe pain and swelling secondary to a bunion deformity.  For several months she has had worsening pain, swelling, and erythema localized to the right great toe overlying the bunion. Over the past two months the pain and inflammation have intensified. The area is red and warm, with pain worsened by movement and direct pressure. Pressure on the side of the toe causes radiating pain up the leg to the buttock along a nerve distribution. She has difficulty walking due to pain.  She has type 2 diabetes treated with oral agents and Ozempic with recent A1c values of 6.1 to 6.5, most recently 6.5 in December 2025. She is working to maintain stable glucose control.  She has psoriasis treated with twice-monthly injections.     Review of Systems: Negative except as noted in the HPI. Denies N/V/F/Ch.  Past Medical History:  Diagnosis Date   Adenomatous colon polyp 08/12/2021   Anxiety    Diabetes mellitus without complication (HCC)    History of kidney stones    Hyperlipidemia    Hypothyroidism    Psoriasis    Current Medications[1]  Tobacco Use History[2]  Allergies[3] Objective:   Constitutional Well developed. Well nourished. Oriented to person, place, and time.  Vascular Dorsalis pedis pulses palpable bilaterally. Posterior tibial pulses palpable bilaterally. Capillary refill normal to all  digits.  No cyanosis or clubbing noted. Pedal hair growth normal.  Neurologic Normal speech. Epicritic sensation to light touch grossly present bilaterally. Negative tinel sign at tarsal tunnel bilaterally.   Dermatologic Skin texture and turgor are within normal limits.  No open wounds. No skin lesions, no skin lesions under second metatarsophalangeal joint.  Musculoskeletal: Normal joint ROM without pain or crepitus bilaterally. Rectus hindfoot.  Hallux abductovalgus deformity present - right Right 1st MPJ full range of motion without pain Right 1st TMT with gross hypermobility. Lesser digital contractures absent right. Mild tailor's bunion present No pain to 2nd MTP   Radiographs: Taken and reviewed. Right Hallux abductovalgus deformity present. Metatarsal parabola demonstrates long second metatarsal.  Mild metatarsus adductus present.  Increased intermetatarsal IM 4-5.  Rectus hindfoot.  1st/2nd IMA: 18.8; TSP: 6     Assessment:   1. Bunion   2. Hallux abductovalgus, right   3. Instability of right foot joint   4. Metatarsal deformity, right      Plan:  Patient was evaluated and treated and all questions answered.  Assessment and Plan Assessment & Plan Right foot bunion with hypermobility Chronic bunion with hypermobility causing pain and functional limitation. Conservative management may relieve symptoms, but Lapidus procedure preferred for lower recurrence risk and long-term outcome. Surgical planning requires diabetes control and medication coordination. - Recommended wide toe box shoes and orthotic inserts. - Discussed Lapidus procedure and alternatives, including recovery and recurrence risks.  Consider possible Akin osteotomy, Weil osteotomy, tailor's bunion correction. - Provided written surgical  information for review. - Planned re-evaluation in 3-4 weeks to finalize surgical decision. - Requested recent A1c documentation for diabetes control confirmation.  Most  recent A1c in EMR is over 8. - Discussed pausing Ozempic and coordinating psoriasis injections if surgery scheduled.    Prentice Ovens, DPM AACFAS Fellowship Trained Podiatric Surgeon [facility]     [1]  Current Outpatient Medications:    clobetasol ointment (TEMOVATE) 0.05 %, Apply 1 Application topically 2 (two) times daily., Disp: , Rfl:    docusate sodium  (COLACE) 100 MG capsule, Take 1 capsule (100 mg total) by mouth 2 (two) times daily as needed., Disp: 30 capsule, Rfl: 2   empagliflozin (JARDIANCE) 25 MG TABS tablet, Take 25 mg by mouth daily., Disp: , Rfl:    ibuprofen  (ADVIL ) 600 MG tablet, Take 1 tablet (600 mg total) by mouth every 6 (six) hours as needed., Disp: 60 tablet, Rfl: 0   levothyroxine (SYNTHROID) 137 MCG tablet, Take 137 mcg by mouth daily before breakfast., Disp: , Rfl:    liraglutide (VICTOZA) 18 MG/3ML SOPN, Inject into the skin daily., Disp: , Rfl:    metFORMIN (GLUCOPHAGE) 1000 MG tablet, Take 1,000 mg by mouth 2 (two) times daily with a meal., Disp: , Rfl:    pioglitazone (ACTOS) 15 MG tablet, Take 15 mg by mouth daily., Disp: , Rfl:    simethicone  (GAS-X) 80 MG chewable tablet, Chew 1 tablet (80 mg total) by mouth 4 (four) times daily as needed for flatulence (bloating)., Disp: 30 tablet, Rfl: 2   Adalimumab (HUMIRA PEN) 40 MG/0.8ML PNKT, Inject 40 mg into the skin every 14 (fourteen) days., Disp: , Rfl:  [2]  Social History Tobacco Use  Smoking Status Never   Passive exposure: Never  Smokeless Tobacco Never  [3] No Known Allergies  "

## 2024-12-28 ENCOUNTER — Ambulatory Visit
